# Patient Record
Sex: Female | Born: 1970 | Race: White | Hispanic: No | Smoking: Current every day smoker
Health system: Southern US, Community
[De-identification: ages and names within clinical notes are randomized; demographics above are authoritative.]

## PROBLEM LIST (undated history)

## (undated) DIAGNOSIS — L309 Dermatitis, unspecified: Secondary | ICD-10-CM

## (undated) DIAGNOSIS — R718 Other abnormality of red blood cells: Secondary | ICD-10-CM

## (undated) DIAGNOSIS — M1611 Unilateral primary osteoarthritis, right hip: Secondary | ICD-10-CM

## (undated) DIAGNOSIS — F410 Panic disorder [episodic paroxysmal anxiety] without agoraphobia: Secondary | ICD-10-CM

## (undated) DIAGNOSIS — I10 Essential (primary) hypertension: Secondary | ICD-10-CM

## (undated) DIAGNOSIS — R112 Nausea with vomiting, unspecified: Secondary | ICD-10-CM

## (undated) DIAGNOSIS — F329 Major depressive disorder, single episode, unspecified: Secondary | ICD-10-CM

## (undated) DIAGNOSIS — K219 Gastro-esophageal reflux disease without esophagitis: Secondary | ICD-10-CM

## (undated) DIAGNOSIS — M1990 Unspecified osteoarthritis, unspecified site: Secondary | ICD-10-CM

## (undated) DIAGNOSIS — E538 Deficiency of other specified B group vitamins: Secondary | ICD-10-CM

## (undated) DIAGNOSIS — M51369 Other intervertebral disc degeneration, lumbar region without mention of lumbar back pain or lower extremity pain: Secondary | ICD-10-CM

## (undated) DIAGNOSIS — N809 Endometriosis, unspecified: Secondary | ICD-10-CM

## (undated) DIAGNOSIS — F32A Depression, unspecified: Secondary | ICD-10-CM

## (undated) DIAGNOSIS — K589 Irritable bowel syndrome without diarrhea: Secondary | ICD-10-CM

## (undated) DIAGNOSIS — Z86718 Personal history of other venous thrombosis and embolism: Secondary | ICD-10-CM

## (undated) DIAGNOSIS — G709 Myoneural disorder, unspecified: Secondary | ICD-10-CM

## (undated) DIAGNOSIS — R609 Edema, unspecified: Secondary | ICD-10-CM

## (undated) DIAGNOSIS — Z87442 Personal history of urinary calculi: Secondary | ICD-10-CM

## (undated) DIAGNOSIS — D510 Vitamin B12 deficiency anemia due to intrinsic factor deficiency: Secondary | ICD-10-CM

## (undated) DIAGNOSIS — Z8489 Family history of other specified conditions: Secondary | ICD-10-CM

## (undated) DIAGNOSIS — M5136 Other intervertebral disc degeneration, lumbar region: Secondary | ICD-10-CM

## (undated) DIAGNOSIS — Z9889 Other specified postprocedural states: Secondary | ICD-10-CM

## (undated) HISTORY — DX: Personal history of other venous thrombosis and embolism: Z86.718

## (undated) HISTORY — DX: Major depressive disorder, single episode, unspecified: F32.9

## (undated) HISTORY — PX: LAPAROSCOPY: SHX197

## (undated) HISTORY — DX: Vitamin B12 deficiency anemia due to intrinsic factor deficiency: D51.0

## (undated) HISTORY — DX: Unilateral primary osteoarthritis, right hip: M16.11

## (undated) HISTORY — DX: Dermatitis, unspecified: L30.9

## (undated) HISTORY — DX: Panic disorder (episodic paroxysmal anxiety): F41.0

## (undated) HISTORY — PX: ANKLE SURGERY: SHX546

## (undated) HISTORY — DX: Depression, unspecified: F32.A

## (undated) HISTORY — DX: Edema, unspecified: R60.9

## (undated) HISTORY — PX: OTHER SURGICAL HISTORY: SHX169

## (undated) HISTORY — PX: LACRIMAL DUCT RECONSTRUCTION: SHX1906

## (undated) HISTORY — PX: ABDOMINAL HYSTERECTOMY: SHX81

## (undated) HISTORY — PX: CHOLECYSTECTOMY: SHX55

---
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
## 1993-11-16 HISTORY — PX: OTHER SURGICAL HISTORY: SHX169

## 2004-12-12 ENCOUNTER — Emergency Department: Payer: Self-pay | Admitting: Unknown Physician Specialty

## 2005-07-24 ENCOUNTER — Ambulatory Visit: Payer: Self-pay

## 2005-07-27 ENCOUNTER — Ambulatory Visit: Payer: Self-pay | Admitting: Family Medicine

## 2006-03-18 ENCOUNTER — Ambulatory Visit: Payer: Self-pay | Admitting: Internal Medicine

## 2006-04-16 ENCOUNTER — Ambulatory Visit: Payer: Self-pay | Admitting: Internal Medicine

## 2006-05-16 ENCOUNTER — Ambulatory Visit: Payer: Self-pay | Admitting: Internal Medicine

## 2006-06-10 ENCOUNTER — Inpatient Hospital Stay: Payer: Self-pay | Admitting: Obstetrics and Gynecology

## 2006-06-16 ENCOUNTER — Ambulatory Visit: Payer: Self-pay | Admitting: Internal Medicine

## 2006-07-17 ENCOUNTER — Ambulatory Visit: Payer: Self-pay | Admitting: Internal Medicine

## 2006-10-13 ENCOUNTER — Emergency Department: Payer: Self-pay | Admitting: Emergency Medicine

## 2008-12-16 ENCOUNTER — Emergency Department: Payer: Self-pay | Admitting: Emergency Medicine

## 2009-03-11 DIAGNOSIS — I10 Essential (primary) hypertension: Secondary | ICD-10-CM | POA: Insufficient documentation

## 2009-08-27 ENCOUNTER — Ambulatory Visit: Payer: Self-pay | Admitting: Family Medicine

## 2009-09-10 ENCOUNTER — Encounter: Payer: Self-pay | Admitting: Orthopedic Surgery

## 2009-09-16 ENCOUNTER — Encounter: Payer: Self-pay | Admitting: Orthopedic Surgery

## 2009-10-16 ENCOUNTER — Encounter: Payer: Self-pay | Admitting: Orthopedic Surgery

## 2009-11-16 ENCOUNTER — Encounter: Payer: Self-pay | Admitting: Orthopedic Surgery

## 2009-11-21 ENCOUNTER — Ambulatory Visit: Payer: Self-pay | Admitting: Family Medicine

## 2010-05-06 DIAGNOSIS — N809 Endometriosis, unspecified: Secondary | ICD-10-CM | POA: Insufficient documentation

## 2010-08-18 ENCOUNTER — Emergency Department: Payer: Self-pay | Admitting: Emergency Medicine

## 2010-08-30 ENCOUNTER — Emergency Department: Payer: Self-pay | Admitting: Emergency Medicine

## 2010-09-04 ENCOUNTER — Emergency Department: Payer: Self-pay | Admitting: Emergency Medicine

## 2010-11-16 HISTORY — PX: KNEE SURGERY: SHX244

## 2011-07-10 ENCOUNTER — Emergency Department: Payer: Self-pay | Admitting: Emergency Medicine

## 2012-01-05 IMAGING — CR DG CHEST 2V
1 series · 2 of 2 positions shown · non-contrast
Comparison: none

REASON FOR EXAM: Shortness of Breath
COMMENTS:   May transport without cardiac monitor

[Series 1: view not recorded · 0.17mm/px · 2 of 2 slices shown]
[im 1/2]
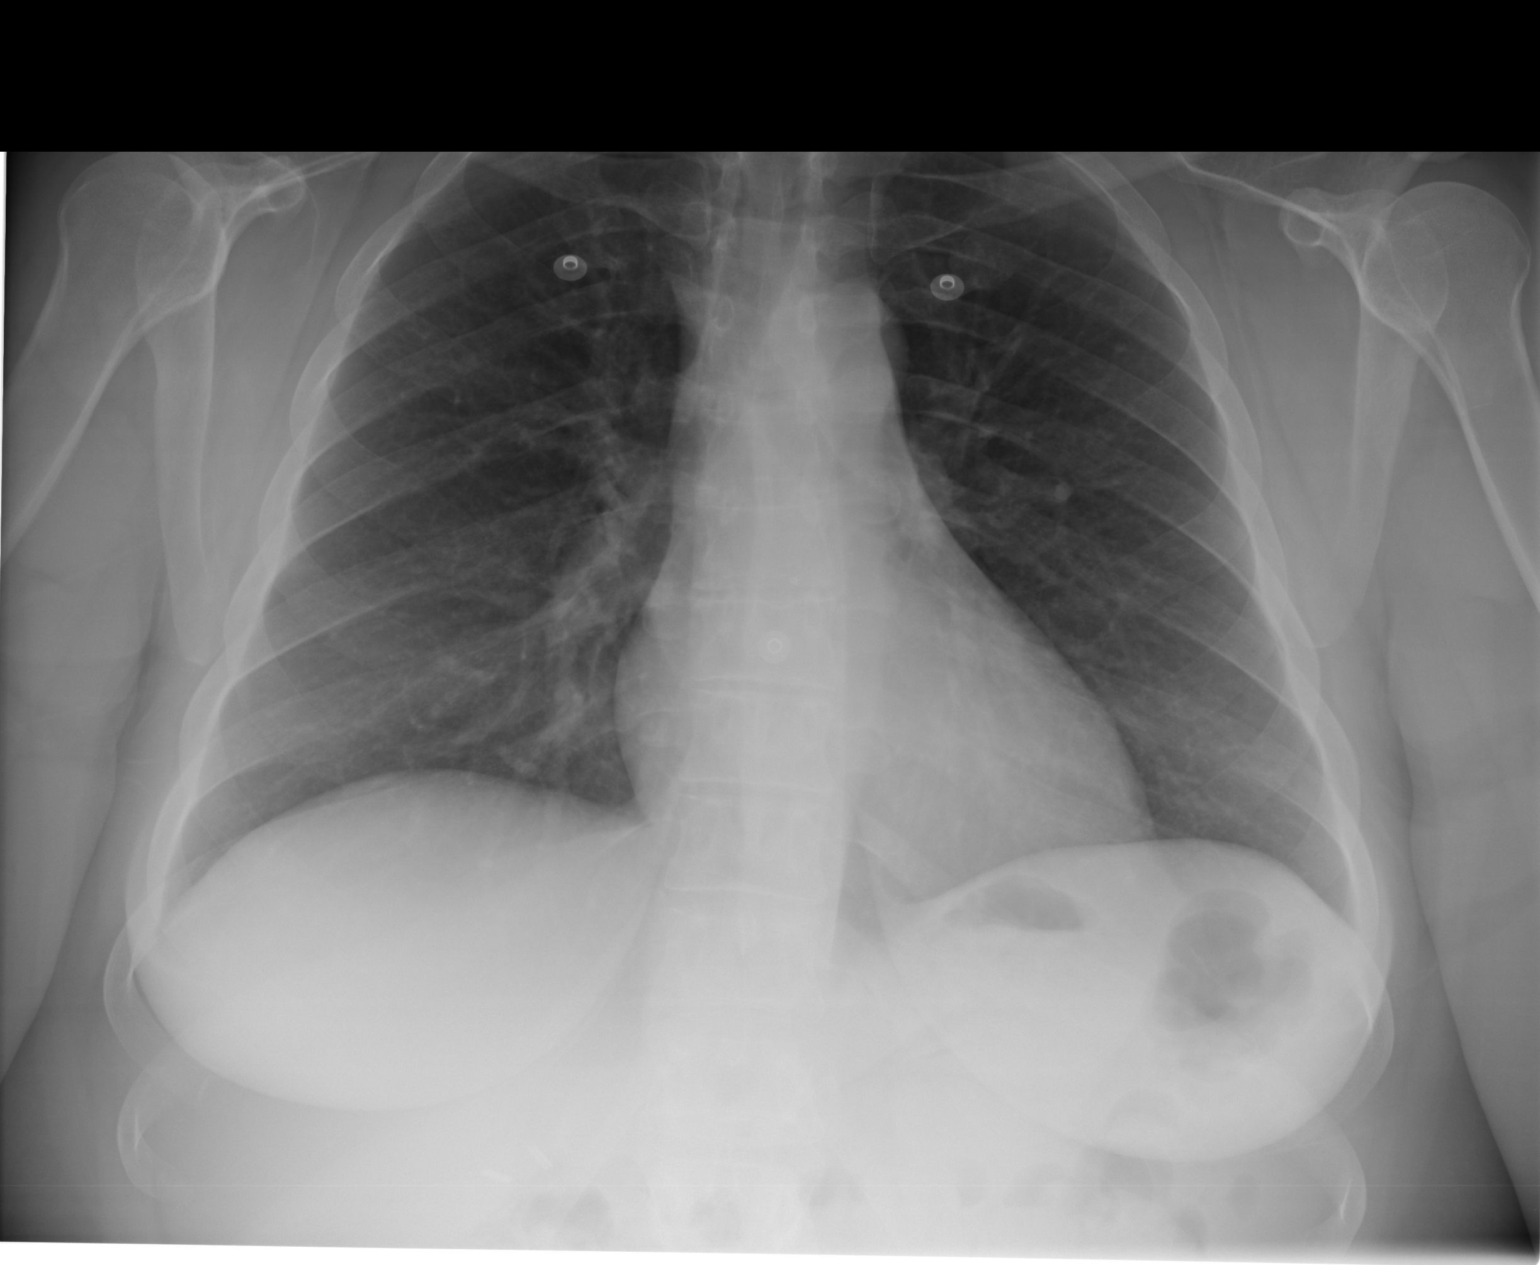
[im 2/2]
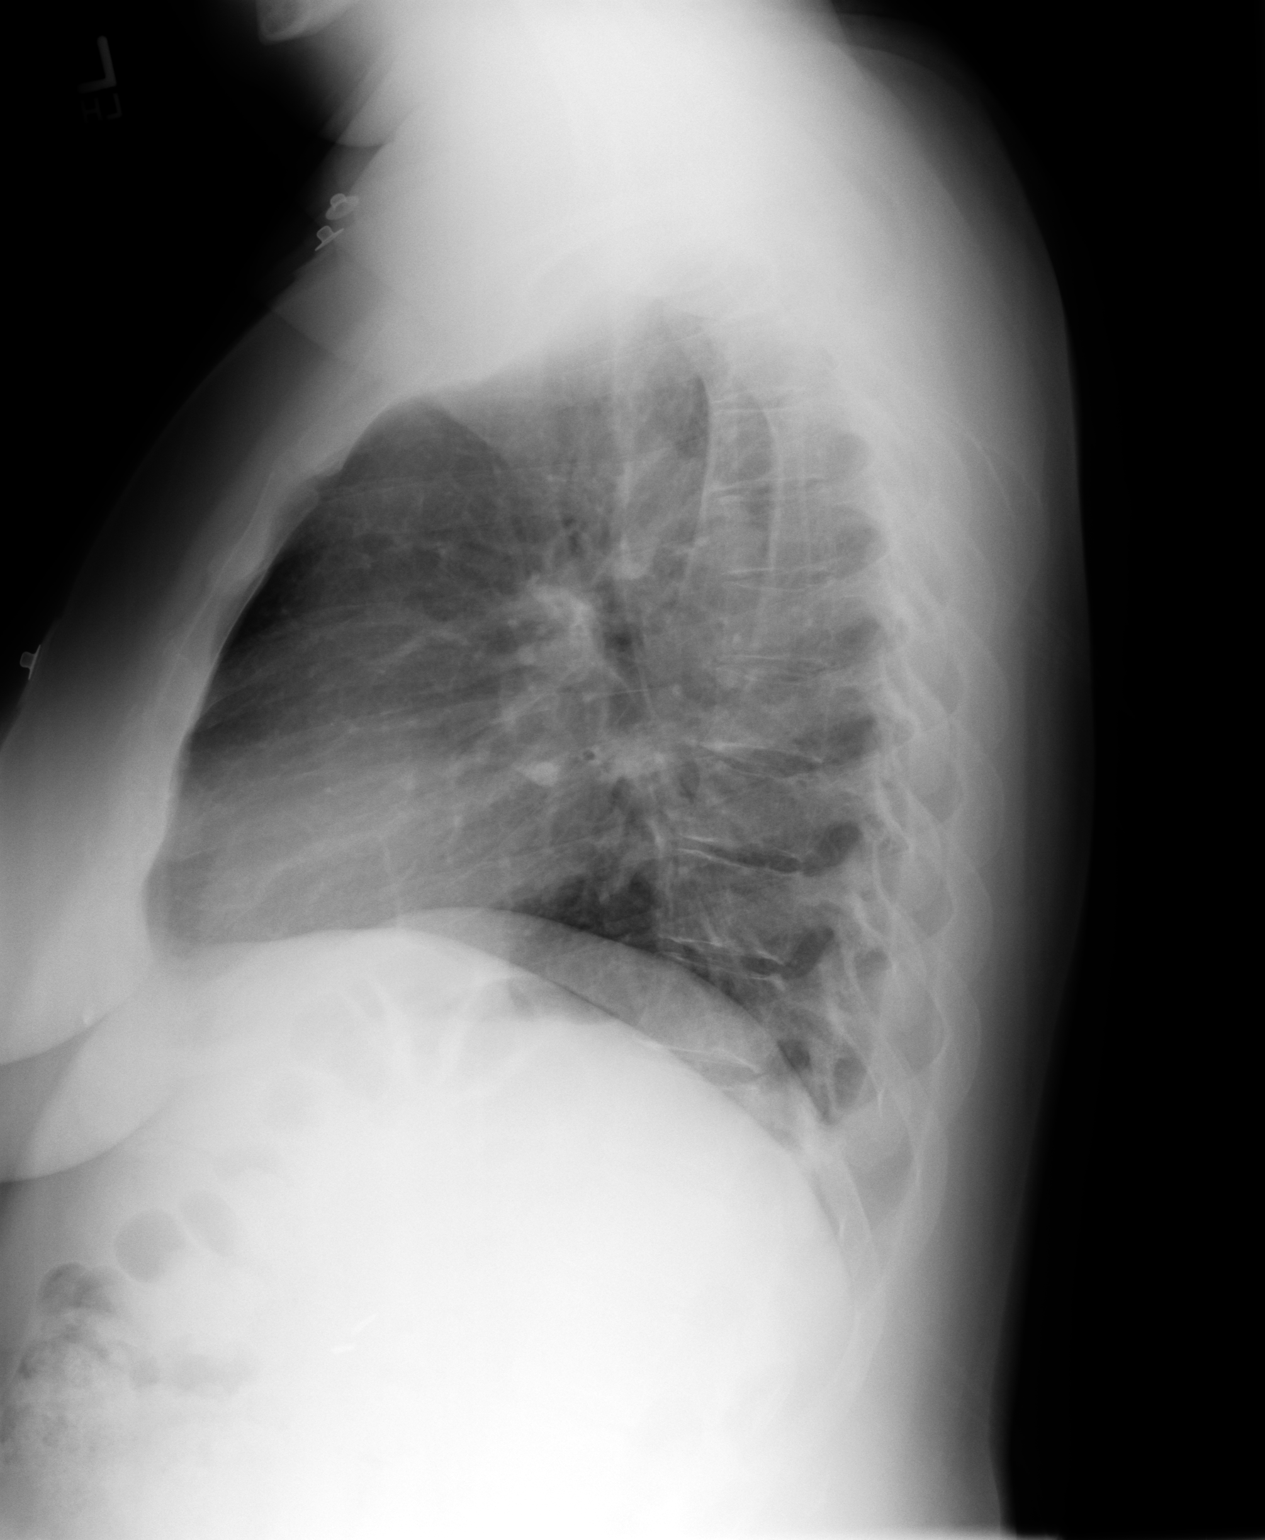

[2 of 2 positions shown; findings below may reference images not displayed]

PROCEDURE:     DXR - DXR CHEST PA (OR AP) AND LATERAL  - July 10, 2011  [DATE]

RESULT:     The lungs are adequately inflated. There is no focal infiltrate.
The cardiac silhouette is normal in size. The pulmonary vascularity is not
engorged. There is no pleural effusion. The mediastinum is normal in width.
IMPRESSION: I do not see evidence of acute cardiopulmonary abnormality.
If the patient's cough persists and remains unexplained, followup plain
films or CT scanning may be of value.

## 2012-02-06 ENCOUNTER — Ambulatory Visit: Payer: Self-pay | Admitting: General Practice

## 2012-03-10 ENCOUNTER — Ambulatory Visit: Payer: Self-pay | Admitting: General Practice

## 2012-03-10 DIAGNOSIS — I10 Essential (primary) hypertension: Secondary | ICD-10-CM

## 2012-03-10 LAB — BASIC METABOLIC PANEL
Calcium, Total: 9.1 mg/dL (ref 8.5–10.1)
Chloride: 103 mmol/L (ref 98–107)
Co2: 29 mmol/L (ref 21–32)
Creatinine: 0.74 mg/dL (ref 0.60–1.30)
EGFR (Non-African Amer.): >60
Glucose: 89 mg/dL (ref 65–99)
Osmolality: 275 (ref 275–301)
Potassium: 3.1 mmol/L — ABNORMAL LOW (ref 3.5–5.1)
Sodium: 139 mmol/L (ref 136–145)

## 2012-03-10 LAB — CBC WITH DIFFERENTIAL/PLATELET
Eosinophil #: 0.1 10*3/uL (ref 0.0–0.7)
Eosinophil %: 1.3 %
HGB: 15.9 g/dL (ref 12.0–16.0)
Lymphocyte %: 23.5 %
MCH: 31 pg (ref 26.0–34.0)
MCHC: 33.7 g/dL (ref 32.0–36.0)
Monocyte %: 4.6 %
Neutrophil %: 69.7 %
RBC: 5.12 10*6/uL (ref 3.80–5.20)
RDW: 13.6 % (ref 11.5–14.5)

## 2012-03-25 ENCOUNTER — Ambulatory Visit: Payer: Self-pay | Admitting: General Practice

## 2012-03-25 LAB — POTASSIUM: Potassium: 3 mmol/L — ABNORMAL LOW (ref 3.5–5.1)

## 2012-05-27 ENCOUNTER — Ambulatory Visit: Payer: Self-pay | Admitting: General Practice

## 2012-08-23 ENCOUNTER — Ambulatory Visit: Payer: Self-pay | Admitting: Hematology and Oncology

## 2012-08-23 LAB — CBC CANCER CENTER
Basophil %: 0.9 %
Eosinophil %: 2.1 %
HCT: 47.4 % — ABNORMAL HIGH (ref 35.0–47.0)
Lymphocyte #: 2.4 x10 3/mm (ref 1.0–3.6)
Lymphocyte %: 19 %
MCH: 30.7 pg (ref 26.0–34.0)
MCHC: 33.8 g/dL (ref 32.0–36.0)
MCV: 91 fL (ref 80–100)
Monocyte #: 0.9 x10 3/mm (ref 0.2–0.9)
Neutrophil %: 71.1 %
Platelet: 358 x10 3/mm (ref 150–440)
RBC: 5.21 10*6/uL — ABNORMAL HIGH (ref 3.80–5.20)
WBC: 12.7 x10 3/mm — ABNORMAL HIGH (ref 3.6–11.0)

## 2012-09-08 LAB — CBC CANCER CENTER
Basophil #: 0.1 x10 3/mm (ref 0.0–0.1)
Eosinophil %: 1.7 %
HCT: 42 % (ref 35.0–47.0)
Lymphocyte #: 2 x10 3/mm (ref 1.0–3.6)
Lymphocyte %: 19.5 %
MCH: 31.1 pg (ref 26.0–34.0)
MCV: 92 fL (ref 80–100)
Monocyte %: 4.5 %
Neutrophil #: 7.5 x10 3/mm — ABNORMAL HIGH (ref 1.4–6.5)
Platelet: 333 x10 3/mm (ref 150–440)
RDW: 14.1 % (ref 11.5–14.5)
WBC: 10.2 x10 3/mm (ref 3.6–11.0)

## 2012-09-08 LAB — COMPREHENSIVE METABOLIC PANEL
Anion Gap: 10 (ref 7–16)
Bilirubin,Total: 0.4 mg/dL (ref 0.2–1.0)
Calcium, Total: 8.7 mg/dL (ref 8.5–10.1)
Chloride: 100 mmol/L (ref 98–107)
Co2: 30 mmol/L (ref 21–32)
Creatinine: 0.75 mg/dL (ref 0.60–1.30)
EGFR (African American): >60
EGFR (Non-African Amer.): >60
Glucose: 87 mg/dL (ref 65–99)
Osmolality: 276 (ref 275–301)
Potassium: 2.9 mmol/L — ABNORMAL LOW (ref 3.5–5.1)
Sodium: 140 mmol/L (ref 136–145)

## 2012-09-09 ENCOUNTER — Ambulatory Visit: Payer: Self-pay | Admitting: Hematology and Oncology

## 2012-09-09 LAB — POTASSIUM: Potassium: 3.4 mmol/L — ABNORMAL LOW (ref 3.5–5.1)

## 2012-09-16 ENCOUNTER — Ambulatory Visit: Payer: Self-pay | Admitting: Hematology and Oncology

## 2012-09-22 ENCOUNTER — Other Ambulatory Visit: Payer: Self-pay | Admitting: Nephrology

## 2012-09-22 LAB — CBC CANCER CENTER
Basophil #: 0.1 x10 3/mm (ref 0.0–0.1)
Eosinophil #: 0.1 x10 3/mm (ref 0.0–0.7)
Lymphocyte #: 2.8 x10 3/mm (ref 1.0–3.6)
Lymphocyte %: 20.8 %
MCHC: 32.6 g/dL (ref 32.0–36.0)
MCV: 93 fL (ref 80–100)
Monocyte #: 0.9 x10 3/mm (ref 0.2–0.9)
Monocyte %: 6.5 %
Neutrophil #: 9.6 x10 3/mm — ABNORMAL HIGH (ref 1.4–6.5)
Platelet: 398 x10 3/mm (ref 150–440)
RDW: 14.2 % (ref 11.5–14.5)
WBC: 13.4 x10 3/mm — ABNORMAL HIGH (ref 3.6–11.0)

## 2012-09-22 LAB — BASIC METABOLIC PANEL
BUN: 11 mg/dL (ref 7–18)
Creatinine: 0.72 mg/dL (ref 0.60–1.30)
EGFR (African American): >60
EGFR (Non-African Amer.): >60
Glucose: 80 mg/dL (ref 65–99)
Osmolality: 276 (ref 275–301)
Sodium: 139 mmol/L (ref 136–145)

## 2012-09-22 LAB — COMPREHENSIVE METABOLIC PANEL
Alkaline Phosphatase: 106 U/L (ref 50–136)
Anion Gap: 12 (ref 7–16)
Calcium, Total: 9 mg/dL (ref 8.5–10.1)
Chloride: 103 mmol/L (ref 98–107)
Co2: 23 mmol/L (ref 21–32)
Creatinine: 0.86 mg/dL (ref 0.60–1.30)
EGFR (Non-African Amer.): >60
Osmolality: 274 (ref 275–301)
Potassium: 3.9 mmol/L (ref 3.5–5.1)
SGOT(AST): 12 U/L — ABNORMAL LOW (ref 15–37)
SGPT (ALT): 18 U/L (ref 12–78)
Sodium: 138 mmol/L (ref 136–145)

## 2012-09-22 LAB — MAGNESIUM
Magnesium: 1.8 mg/dL
Magnesium: 2 mg/dL

## 2012-10-16 ENCOUNTER — Ambulatory Visit: Payer: Self-pay | Admitting: Hematology and Oncology

## 2014-05-24 ENCOUNTER — Emergency Department: Payer: Self-pay | Admitting: Emergency Medicine

## 2014-06-19 DIAGNOSIS — M7061 Trochanteric bursitis, right hip: Secondary | ICD-10-CM | POA: Insufficient documentation

## 2014-06-19 DIAGNOSIS — M533 Sacrococcygeal disorders, not elsewhere classified: Secondary | ICD-10-CM | POA: Insufficient documentation

## 2014-06-19 DIAGNOSIS — M25859 Other specified joint disorders, unspecified hip: Secondary | ICD-10-CM | POA: Insufficient documentation

## 2014-06-19 DIAGNOSIS — M76899 Other specified enthesopathies of unspecified lower limb, excluding foot: Secondary | ICD-10-CM | POA: Insufficient documentation

## 2014-07-03 DIAGNOSIS — M461 Sacroiliitis, not elsewhere classified: Secondary | ICD-10-CM | POA: Insufficient documentation

## 2014-09-05 DIAGNOSIS — R600 Localized edema: Secondary | ICD-10-CM | POA: Insufficient documentation

## 2014-09-05 DIAGNOSIS — I10 Essential (primary) hypertension: Secondary | ICD-10-CM | POA: Insufficient documentation

## 2014-09-12 DIAGNOSIS — M5116 Intervertebral disc disorders with radiculopathy, lumbar region: Secondary | ICD-10-CM | POA: Insufficient documentation

## 2014-09-12 DIAGNOSIS — M5416 Radiculopathy, lumbar region: Secondary | ICD-10-CM | POA: Insufficient documentation

## 2014-09-19 ENCOUNTER — Ambulatory Visit: Payer: Self-pay | Admitting: Physical Medicine and Rehabilitation

## 2014-09-27 DIAGNOSIS — M94262 Chondromalacia, left knee: Secondary | ICD-10-CM | POA: Insufficient documentation

## 2014-10-04 DIAGNOSIS — M5136 Other intervertebral disc degeneration, lumbar region: Secondary | ICD-10-CM | POA: Insufficient documentation

## 2014-10-08 ENCOUNTER — Emergency Department: Payer: Self-pay | Admitting: Emergency Medicine

## 2014-10-23 DIAGNOSIS — M170 Bilateral primary osteoarthritis of knee: Secondary | ICD-10-CM | POA: Insufficient documentation

## 2015-03-10 NOTE — Op Note (Signed)
PATIENT NAME:  Michelle Simpson, Michelle Simpson MR#:  [id number] DATE OF BIRTH:  1970/12/09  DATE OF PROCEDURE:  03/25/2012  PREOPERATIVE DIAGNOSIS: Internal derangement of the left knee.   POSTOPERATIVE DIAGNOSES:    1. Tear of the posterior horn of the lateral meniscus, left knee.  2. 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   PROCEDURE PERFORMED: Left knee arthroscopy, partial lateral meniscectomy, and patellofemoral chondroplasty.   SURGEON:  Dr. Skip Estimable  ANESTHESIA: Spinal.   ESTIMATED BLOOD LOSS: Minimal.   TOURNIQUET TIME: Not used.   DRAINS: None.   INDICATIONS FOR SURGERY: The patient is a 44 year old female who has been seen for complaints of progressive left knee pain. MRI demonstrated findings consistent with meniscal pathology. After discussion of the risks and benefits of surgical intervention, the patient expressed her understanding of the risks and benefits and agreed with plans for surgical intervention.   PROCEDURE IN DETAIL: The patient was brought into the operating room and, after adequate spinal anesthesia was achieved, a tourniquet was placed on the patient's left thigh and the leg was placed in a leg holder. All bony prominences were well padded. The left knee and leg were cleaned and prepped with alcohol and DuraPrep and draped in the usual sterile fashion. A "time-out" was performed as per usual protocol. The anticipated portal sites were injected with 0.25% Marcaine with epinephrine. An anterolateral portal was created and cannula was inserted. The scope was inserted and the knee was distended with fluid using the Depew Mitek pump. The scope was advanced down the medial gutter into the medial compartment of the knee. Under visualization with the scope, an anteromedial portal was created and hooked probe was inserted. Inspection of the medial compartment showed the articular surface to be in excellent condition. The medial meniscus was visualized and probed  and felt to be stable. The scope was then advanced into the intercondylar region. The anterior cruciate ligament was visualized and probed and felt to be stable. The scope was removed from the anterolateral portal and reinserted via the anteromedial portal so as to better visualize the lateral compartment. The articular surface of the lateral compartment was in good condition. There was a degenerative tear of the posterior horn of the lateral meniscus, which was debrided using meniscal punches and a 4.5-mm shaver. Additional contouring and debridement was performed using the 50-degrees ArthroCare wand. The remaining rim of meniscus was visualized and probed and felt to be stable. Finally, the scope was positioned so as to visualize the patellofemoral articulation. Reasonably good patellar tracking was noted. However, there was noted to be an area of grade 3 chondromalacia involving the articular surface of the patella as well as the intercondylar groove. Debridement and contouring was performed using the 50-degree ArthroCare wand. The scope was returned to the lateral compartment. It should  be noted that there was some softening and small fissure to the lateral tibial plateau. This was debrided and contoured using the ArthroCare wand. The knee was irrigated with copious amounts of fluid and then suctioned dry. The anterolateral portal was reapproximated using #3-0 nylon. A combination of 0.25% Marcaine with epinephrine and 4 mg morphine was injected via the scope. The scope was removed and the anteromedial portal was reapproximated using #3-0 nylon. Sterile dressing was applied followed by application of an ice wrap.   The patient tolerated the procedure well. She was transported to the recovery room in stable condition.    ____________________________ Laurice Record. Holley Bouche., MD [email or web address] D: 03/25/2012 18:20:50  ET T: 03/26/2012 15:17:36 ET JOB#: [id number]  cc: Jeneen Rinks P. Holley Bouche., MD, <Dictator> JAMES P Holley Bouche MD ELECTRONICALLY SIGNED 04/02/2012 21:20

## 2015-05-10 ENCOUNTER — Encounter: Payer: Self-pay | Admitting: Emergency Medicine

## 2015-05-10 ENCOUNTER — Emergency Department
Admission: EM | Admit: 2015-05-10 | Discharge: 2015-05-10 | Disposition: A | Discharge status: Home / Self Care | Payer: Self-pay | Attending: Emergency Medicine | Admitting: Emergency Medicine

## 2015-05-10 DIAGNOSIS — M549 Dorsalgia, unspecified: Secondary | ICD-10-CM | POA: Insufficient documentation

## 2015-05-10 DIAGNOSIS — Z79899 Other long term (current) drug therapy: Secondary | ICD-10-CM | POA: Insufficient documentation

## 2015-05-10 DIAGNOSIS — I10 Essential (primary) hypertension: Secondary | ICD-10-CM | POA: Insufficient documentation

## 2015-05-10 DIAGNOSIS — Z91040 Latex allergy status: Secondary | ICD-10-CM | POA: Insufficient documentation

## 2015-05-10 DIAGNOSIS — G8929 Other chronic pain: Secondary | ICD-10-CM | POA: Insufficient documentation

## 2015-05-10 HISTORY — DX: Endometriosis, unspecified: N80.9

## 2015-05-10 HISTORY — DX: Essential (primary) hypertension: I10

## 2015-05-10 HISTORY — DX: Deficiency of other specified B group vitamins: E53.8

## 2015-05-10 HISTORY — DX: Irritable bowel syndrome, unspecified: K58.9

## 2015-05-10 HISTORY — DX: Other intervertebral disc degeneration, lumbar region: M51.36

## 2015-05-10 HISTORY — DX: Other intervertebral disc degeneration, lumbar region without mention of lumbar back pain or lower extremity pain: M51.369

## 2015-05-10 HISTORY — DX: Other abnormality of red blood cells: R71.8

## 2015-05-10 MED ORDER — DIAZEPAM 2 MG PO TABS: 2.0000 mg | ORAL_TABLET | Freq: Three times a day (TID) | ORAL | Status: AC | PRN

## 2015-05-10 MED ORDER — PREDNISONE 10 MG (21) PO TBPK: ORAL_TABLET | ORAL | Status: DC

## 2015-05-10 MED ORDER — DIAZEPAM 2 MG PO TABS
2.0000 mg | ORAL_TABLET | Freq: Once | ORAL | Status: AC
  Administered 2015-05-10: 2 mg via ORAL

## 2015-05-10 MED ORDER — DEXAMETHASONE SODIUM PHOSPHATE 10 MG/ML IJ SOLN
10.0000 mg | Freq: Once | INTRAMUSCULAR | Status: AC
  Administered 2015-05-10: 10 mg via INTRAMUSCULAR

## 2015-05-10 MED ORDER — DIAZEPAM 2 MG PO TABS
ORAL_TABLET | ORAL | Status: AC
  Administered 2015-05-10: 2 mg via ORAL
  Filled 2015-05-10: qty 1

## 2015-05-10 MED ORDER — OXYCODONE HCL 5 MG PO TABS
ORAL_TABLET | ORAL | Status: AC
  Administered 2015-05-10: 5 mg via ORAL
  Filled 2015-05-10: qty 1

## 2015-05-10 MED ORDER — DEXAMETHASONE SODIUM PHOSPHATE 10 MG/ML IJ SOLN
INTRAMUSCULAR | Status: AC
  Administered 2015-05-10: 10 mg via INTRAMUSCULAR
  Filled 2015-05-10: qty 1

## 2015-05-10 MED ORDER — OXYCODONE HCL 5 MG PO TABS
5.0000 mg | ORAL_TABLET | Freq: Once | ORAL | Status: AC
  Administered 2015-05-10: 5 mg via ORAL

## 2015-05-10 MED ORDER — HYDROCODONE-ACETAMINOPHEN 5-325 MG PO TABS: 1.0000 | ORAL_TABLET | ORAL | Status: DC | PRN

## 2015-05-10 NOTE — ED Provider Notes (Signed)
[facility] Emergency Department Provider Note ____________________________________________  Time seen: Approximately 10:05 PM  I have reviewed the triage vital signs and the nursing notes.   HISTORY  Chief Complaint Back Pain    HPI Michelle Simpson is a 44 y.o. female who presents to the emergency department for a flare of her chronic back pain that was triggered by writing for a long period of time in the vehicle. She is working with a spine specialist who is doing facet injections and epidural injections. Her last injection was a few months ago. She states that she is unable to sit, stand, or lie in any position for an extended period of time without pain. She reports that this flare is similar to previous in location and character. She denies new injury.   Past Medical History  Diagnosis Date  . Degenerative disc disease, lumbar   . Hypertension   . B12 deficiency   . High mean corpuscular hemoglobin concentration (MCHC)   . Endometriosis   . IBS (irritable bowel syndrome)     There are no active problems to display for this patient.   Past Surgical History  Procedure Laterality Date  . Ankle surgery Right   . Abdominal hysterectomy      Current Outpatient Rx  Name  Route  Sig  Dispense  Refill  . diazepam (VALIUM) 2 MG tablet   Oral   Take 1 tablet (2 mg total) by mouth every 8 (eight) hours as needed for anxiety.   12 tablet   0   . HYDROcodone-acetaminophen (NORCO/VICODIN) 5-325 MG per tablet   Oral   Take 1 tablet by mouth every 4 (four) hours as needed for moderate pain.   9 tablet   0   . predniSONE (STERAPRED UNI-PAK 21 TAB) 10 MG (21) TBPK tablet      Take 6 tablets on day 1 Take 5 tablets on day 2 Take 4 tablets on day 3 Take 3 tablets on day 4 Take 2 tablets on day 5 Take 1 tablet on day 6   21 tablet   0     Allergies Amlodipine; Latex; Metoprolol; Nsaids; and Paxil cr  No family history on file.  Social  History History  Substance Use Topics  . Smoking status: Current Every Day Smoker    Types: Cigarettes  . Smokeless tobacco: Not on file  . Alcohol Use: No    Review of Systems Constitutional: No recent illness. Eyes: No visual changes. ENT: No sore throat. Cardiovascular: Denies chest pain or palpitations. Respiratory: Denies shortness of breath. Gastrointestinal: No abdominal pain.  Genitourinary: Negative for dysuria. Musculoskeletal: Pain in lower back Skin: Negative for rash. Neurological: Negative for headaches, focal weakness or numbness. 10-point ROS otherwise negative.  ____________________________________________   PHYSICAL EXAM:  VITAL SIGNS: ED Triage Vitals  Enc Vitals Group     BP 05/10/15 1942 189/97 mmHg     Pulse Rate 05/10/15 1938 70     Resp 05/10/15 1938 18     Temp 05/10/15 1938 98.4 F (36.9 C)     Temp Source 05/10/15 1938 Oral     SpO2 05/10/15 1938 98 %     Weight 05/10/15 1938 200 lb (90.719 kg)     Height 05/10/15 1938 5\' 7"  (1.702 m)     Head Cir --      Peak Flow --      Pain Score 05/10/15 1939 9     Pain Loc --  Pain Edu? --      Excl. in [place]? --     Constitutional: Alert and oriented. Well appearing and in no acute distress. Appears to be in pain. Eyes: Conjunctivae are normal. EOMI. Head: Atraumatic. Nose: No congestion/rhinnorhea. Neck: No stridor.  Respiratory: Normal respiratory effort.   Musculoskeletal: Full range of motion possible. Full extension/standing completely upright is most painful. Ambulatory with antalgic gait. Neurologic:  Normal speech and language. No gross focal neurologic deficits are appreciated. Speech is normal. No gait instability. Skin:  Skin is warm, dry and intact. Atraumatic. Psychiatric: Mood and affect are normal. Speech and behavior are normal.  ____________________________________________   LABS (all labs ordered are listed, but only abnormal results are displayed)  Labs Reviewed - No  data to display ____________________________________________  RADIOLOGY  Not indicated ____________________________________________   PROCEDURES  Procedure(s) performed: None   ____________________________________________   INITIAL IMPRESSION / ASSESSMENT AND PLAN / ED COURSE  Pertinent labs & imaging results that were available during my care of the patient were reviewed by me and considered in my medical decision making (see chart for details).  Patient is to call her specialist on Monday. She was advised to return to the emergency department for symptoms that change or worsen including incontinence of bowel or bladder. She was advised to take medications as prescribed. She was also advised that the emergency department does not typically manage chronic pain. ____________________________________________   FINAL CLINICAL IMPRESSION(S) / ED DIAGNOSES  Final diagnoses:  Chronic back pain greater than 3 months duration       Victorino Dike, FNP 05/10/15 2212  Ahmed Prima, MD 05/11/15 [id number]

## 2015-05-10 NOTE — Discharge Instructions (Signed)
Follow up with your back specialist for symptoms that are not improving over the next 48 hours. Return to the ER for symptoms that change or worsen if unable to schedule an appointment with the specialist.

## 2015-05-10 NOTE — ED Notes (Signed)
Pt ambulatory to ED c/o lower back pain, hx of degenerative disc disease.

## 2016-03-20 ENCOUNTER — Encounter: Payer: Self-pay | Admitting: Family Medicine

## 2016-03-20 ENCOUNTER — Ambulatory Visit (INDEPENDENT_AMBULATORY_CARE_PROVIDER_SITE_OTHER): Payer: BLUE CROSS/BLUE SHIELD | Admitting: Family Medicine

## 2016-03-20 VITALS — BP 138/100 | HR 70 | Temp 98.1°F | Resp 16 | Ht 67.5 in | Wt 196.6 lb

## 2016-03-20 DIAGNOSIS — M7989 Other specified soft tissue disorders: Secondary | ICD-10-CM | POA: Insufficient documentation

## 2016-03-20 DIAGNOSIS — M1611 Unilateral primary osteoarthritis, right hip: Secondary | ICD-10-CM | POA: Insufficient documentation

## 2016-03-20 DIAGNOSIS — J309 Allergic rhinitis, unspecified: Secondary | ICD-10-CM | POA: Insufficient documentation

## 2016-03-20 DIAGNOSIS — E538 Deficiency of other specified B group vitamins: Secondary | ICD-10-CM

## 2016-03-20 DIAGNOSIS — R002 Palpitations: Secondary | ICD-10-CM | POA: Diagnosis not present

## 2016-03-20 DIAGNOSIS — E785 Hyperlipidemia, unspecified: Secondary | ICD-10-CM

## 2016-03-20 DIAGNOSIS — D6851 Activated protein C resistance: Secondary | ICD-10-CM | POA: Insufficient documentation

## 2016-03-20 DIAGNOSIS — R102 Pelvic and perineal pain: Secondary | ICD-10-CM

## 2016-03-20 DIAGNOSIS — M169 Osteoarthritis of hip, unspecified: Secondary | ICD-10-CM | POA: Insufficient documentation

## 2016-03-20 DIAGNOSIS — G8929 Other chronic pain: Secondary | ICD-10-CM | POA: Insufficient documentation

## 2016-03-20 DIAGNOSIS — Z86718 Personal history of other venous thrombosis and embolism: Secondary | ICD-10-CM | POA: Insufficient documentation

## 2016-03-20 DIAGNOSIS — I10 Essential (primary) hypertension: Secondary | ICD-10-CM

## 2016-03-20 MED ORDER — FUROSEMIDE 40 MG PO TABS: 40.0000 mg | ORAL_TABLET | Freq: Every day | ORAL | Status: DC

## 2016-03-20 MED ORDER — BISOPROLOL FUMARATE 5 MG PO TABS: 5.0000 mg | ORAL_TABLET | Freq: Every day | ORAL | Status: DC

## 2016-03-20 NOTE — Patient Instructions (Signed)
We will call you with the lab results. 

## 2016-03-20 NOTE — Progress Notes (Signed)
Subjective:     Patient ID: Michelle Simpson, female   DOB: 22-Nov-1970, 45 y.o.   MRN: [id number]  HPI  Chief Complaint  Patient presents with  . Hypertension    Patient comes into office today with concerns of elevated blood pressure for the past 2 weeks. Patient reports that she has been checking her bloodpressure daily and her systolic readings have been ranging from 123XX123, diastolic reading ranging from 100-140. Patient reports symptoms of chest pounding, shortness of breath, visual changes decribes as seeing spots and frequent headaches. Patient states that she is currently taking Bisprolol 2.5mg  and Lasix PRN.   States she was due to see her cardiologist to f/u hypertension this Spring but he is no longer in her insurance list. Last seen in our office 10/2014. Reports she is working extra hours at work, drinking 72 oz of green tea daily, and coping with her partner's loss of job. States she has resumed using prn Lasix due to her bp elevations.   Review of Systems  Constitutional: Positive for fatigue.  Hematological:       Hx of B12 deficiency, states she takes two sublingual supplements weekly. Hx of polycythemia felt due to cigarette smoking.       Objective:   Physical Exam  Constitutional: She appears well-developed and well-nourished. No distress.  Cardiovascular: Normal rate and regular rhythm.   Pulmonary/Chest: Breath sounds normal.  Musculoskeletal: She exhibits no edema (of lower extremities).  .     Assessment:    1. Benign essential HTN - Comprehensive metabolic panel - bisoprolol (ZEBETA) 5 MG tablet; Take 1 tablet (5 mg total) by mouth daily.  Dispense: 30 tablet; Refill: 5  2. Palpitations - CBC with Differential/Platelet  3. Vitamin B12 deficiency - B12  4. Leg swelling - furosemide (LASIX) 40 MG tablet; Take 1 tablet (40 mg total) by mouth daily. As needed for daily for leg swelling  Dispense: 30 tablet; Refill: 2  5. Hyperlipidemia - Lipid panel    Plan:   Further f/u pending labs and in two weeks.

## 2016-03-21 LAB — LIPID PANEL
Chol/HDL Ratio: 4.2 ratio units (ref 0.0–4.4)
Cholesterol, Total: 259 mg/dL — ABNORMAL HIGH (ref 100–199)
HDL: 62 mg/dL (ref >39)
LDL Calculated: 174 mg/dL — ABNORMAL HIGH (ref 0–99)
Triglycerides: 116 mg/dL (ref 0–149)
VLDL CHOLESTEROL CAL: 23 mg/dL (ref 5–40)

## 2016-03-21 LAB — COMPREHENSIVE METABOLIC PANEL
ALT: 11 IU/L (ref 0–32)
AST: 15 IU/L (ref 0–40)
Albumin/Globulin Ratio: 1.8 (ref 1.2–2.2)
Albumin: 4.8 g/dL (ref 3.5–5.5)
Alkaline Phosphatase: 62 IU/L (ref 39–117)
BUN/Creatinine Ratio: 14 (ref 9–23)
BUN: 10 mg/dL (ref 6–24)
Bilirubin Total: 0.3 mg/dL (ref 0.0–1.2)
CALCIUM: 9.8 mg/dL (ref 8.7–10.2)
CHLORIDE: 97 mmol/L (ref 96–106)
CO2: 20 mmol/L (ref 18–29)
CREATININE: 0.72 mg/dL (ref 0.57–1.00)
GFR, EST AFRICAN AMERICAN: 117 mL/min/{1.73_m2} (ref >59)
GFR, EST NON AFRICAN AMERICAN: 101 mL/min/{1.73_m2} (ref >59)
GLUCOSE: 111 mg/dL — AB (ref 65–99)
Globulin, Total: 2.6 g/dL (ref 1.5–4.5)
Potassium: 4.5 mmol/L (ref 3.5–5.2)
Sodium: 140 mmol/L (ref 134–144)
TOTAL PROTEIN: 7.4 g/dL (ref 6.0–8.5)

## 2016-03-21 LAB — VITAMIN B12: VITAMIN B 12: 1388 pg/mL — AB (ref 211–946)

## 2016-03-23 ENCOUNTER — Telehealth: Payer: Self-pay

## 2016-03-23 NOTE — Telephone Encounter (Signed)
-----   Message from Carmon Ginsberg, [place] sent at 03/23/2016  1:55 PM EDT ----- Regarding: labs Please check with [facility] and see why her CBC result is not up. I have reviewed her other labs with her.

## 2016-03-23 NOTE — Telephone Encounter (Signed)
Spoke with [facility] representative who states that there was no order for CBC given (its noted in chart that CBC was ordered) Gave verbal authorization to fun test if there is enough sample left. Pending on fax report for confirmation. KW

## 2016-03-28 LAB — CBC WITH DIFFERENTIAL/PLATELET
BASOS: 0 %
Basophils Absolute: 0 10*3/uL (ref 0.0–0.2)
EOS (ABSOLUTE): 0.2 10*3/uL (ref 0.0–0.4)
EOS: 3 %
HEMATOCRIT: 48.3 % — AB (ref 34.0–46.6)
Hemoglobin: 16.2 g/dL — ABNORMAL HIGH (ref 11.1–15.9)
IMMATURE GRANS (ABS): 0 10*3/uL (ref 0.0–0.1)
IMMATURE GRANULOCYTES: 0 %
LYMPHS: 27 %
Lymphocytes Absolute: 2 10*3/uL (ref 0.7–3.1)
MCH: 29.7 pg (ref 26.6–33.0)
MCHC: 33.5 g/dL (ref 31.5–35.7)
MCV: 89 fL (ref 79–97)
MONOS ABS: 0.4 10*3/uL (ref 0.1–0.9)
Monocytes: 5 %
NEUTROS ABS: 4.9 10*3/uL (ref 1.4–7.0)
NEUTROS PCT: 65 %
Platelets: 306 10*3/uL (ref 150–379)
RBC: 5.46 x10E6/uL — ABNORMAL HIGH (ref 3.77–5.28)
RDW: 14.1 % (ref 12.3–15.4)
WBC: 7.5 10*3/uL (ref 3.4–10.8)

## 2016-03-30 ENCOUNTER — Other Ambulatory Visit: Payer: Self-pay | Admitting: Family Medicine

## 2016-03-30 ENCOUNTER — Encounter: Payer: Self-pay | Admitting: Family Medicine

## 2016-03-30 ENCOUNTER — Telehealth: Payer: Self-pay

## 2016-03-30 DIAGNOSIS — D45 Polycythemia vera: Secondary | ICD-10-CM

## 2016-03-30 DIAGNOSIS — D751 Secondary polycythemia: Secondary | ICD-10-CM

## 2016-03-30 NOTE — Telephone Encounter (Signed)
Referral in progress. 

## 2016-03-30 NOTE — Telephone Encounter (Signed)
-----   Message from Carmon Ginsberg, [place] sent at 03/30/2016  7:42 AM EDT ----- No anemia but your Hematocrit is up to 48.5% c/w your prior diagnosis of polycythemia. Do you wish to see hematology for phlebotomy?

## 2016-03-30 NOTE — Telephone Encounter (Signed)
Patient has been advised, she would like to proceed with you setting referral up to hematologist. Michelle Simpson

## 2016-04-03 ENCOUNTER — Ambulatory Visit (INDEPENDENT_AMBULATORY_CARE_PROVIDER_SITE_OTHER): Payer: BLUE CROSS/BLUE SHIELD | Admitting: Family Medicine

## 2016-04-03 ENCOUNTER — Encounter: Payer: Self-pay | Admitting: Family Medicine

## 2016-04-03 VITALS — BP 120/74 | HR 66 | Temp 98.3°F | Resp 16 | Wt 197.4 lb

## 2016-04-03 DIAGNOSIS — M5136 Other intervertebral disc degeneration, lumbar region: Secondary | ICD-10-CM

## 2016-04-03 DIAGNOSIS — I10 Essential (primary) hypertension: Secondary | ICD-10-CM | POA: Diagnosis not present

## 2016-04-03 DIAGNOSIS — D45 Polycythemia vera: Secondary | ICD-10-CM

## 2016-04-03 DIAGNOSIS — M170 Bilateral primary osteoarthritis of knee: Secondary | ICD-10-CM

## 2016-04-03 NOTE — Progress Notes (Signed)
Subjective:     Patient ID: Michelle Simpson, female   DOB: 1970/12/17, 45 y.o.   MRN: [id number]  HPI  Chief Complaint  Patient presents with  . Hypertension    Patient comes in office today fo 2 week follow up, last office visit was 03/20/16 CMP was ordered and we increased patients Zebta to 5mg  qd. Patient reports good compliance and tolerance on medication.   . Palpitations    Follow up from 03/20/16, CBC with Diff was orderd, patient reports that she has had a few palpitations since last visit but not nearly as much as before.   States she is due to see the hematologist next week for phlebotomy. Reports that many of her other symptoms were contributed to by her elevated H & H. Wishes referral to pain clinic for her DDD and knee arthritis. States she has had prior injections and placed on numerous medications including Lyrica, gabapentin, and NSAID's. She gets therapeutic massage every 2-3 weeks which gives her temporary relief of back pain. Reports she decreased her green tea consumption by half and that also helped reduce her palpitations. Due to elevated B12 she will back down to every other day supplements.   Review of Systems     Objective:   Physical Exam  Constitutional: She appears well-developed and well-nourished. No distress.  Cardiovascular: Normal rate and regular rhythm.   Pulmonary/Chest: Breath sounds normal.  Musculoskeletal: She exhibits no edema (of lower extremities).       Assessment:    1. Benign essential HTN: stay on current dose of bisoprolol 5 mg.  2. DDD (degenerative disc disease), lumbar - Ambulatory referral to Pain Clinic  3. Primary osteoarthritis of both knees - Ambulatory referral to Pain Clinic  4. Polycythemia [facility]): pending hematology consult     Plan:    Nurse bp check in one-two weeks after phlebotomized.

## 2016-04-03 NOTE — Patient Instructions (Signed)
We will call you with the information about the referral.

## 2016-04-08 ENCOUNTER — Encounter: Payer: Self-pay | Admitting: Internal Medicine

## 2016-04-08 ENCOUNTER — Inpatient Hospital Stay: Payer: BLUE CROSS/BLUE SHIELD | Attending: Internal Medicine | Admitting: Internal Medicine

## 2016-04-08 VITALS — BP 180/123 | HR 61 | Temp 98.4°F | Resp 20 | Wt 199.7 lb

## 2016-04-08 DIAGNOSIS — K589 Irritable bowel syndrome without diarrhea: Secondary | ICD-10-CM | POA: Diagnosis not present

## 2016-04-08 DIAGNOSIS — Z86718 Personal history of other venous thrombosis and embolism: Secondary | ICD-10-CM | POA: Diagnosis not present

## 2016-04-08 DIAGNOSIS — I10 Essential (primary) hypertension: Secondary | ICD-10-CM

## 2016-04-08 DIAGNOSIS — R5383 Other fatigue: Secondary | ICD-10-CM | POA: Diagnosis not present

## 2016-04-08 DIAGNOSIS — L309 Dermatitis, unspecified: Secondary | ICD-10-CM

## 2016-04-08 DIAGNOSIS — D751 Secondary polycythemia: Secondary | ICD-10-CM | POA: Insufficient documentation

## 2016-04-08 DIAGNOSIS — M1611 Unilateral primary osteoarthritis, right hip: Secondary | ICD-10-CM | POA: Diagnosis not present

## 2016-04-08 DIAGNOSIS — D582 Other hemoglobinopathies: Secondary | ICD-10-CM | POA: Insufficient documentation

## 2016-04-08 DIAGNOSIS — Z7982 Long term (current) use of aspirin: Secondary | ICD-10-CM

## 2016-04-08 DIAGNOSIS — G629 Polyneuropathy, unspecified: Secondary | ICD-10-CM

## 2016-04-08 DIAGNOSIS — M5136 Other intervertebral disc degeneration, lumbar region: Secondary | ICD-10-CM | POA: Diagnosis not present

## 2016-04-08 DIAGNOSIS — D45 Polycythemia vera: Secondary | ICD-10-CM

## 2016-04-08 DIAGNOSIS — E538 Deficiency of other specified B group vitamins: Secondary | ICD-10-CM | POA: Insufficient documentation

## 2016-04-08 DIAGNOSIS — F329 Major depressive disorder, single episode, unspecified: Secondary | ICD-10-CM | POA: Diagnosis not present

## 2016-04-08 DIAGNOSIS — Z87891 Personal history of nicotine dependence: Secondary | ICD-10-CM | POA: Diagnosis not present

## 2016-04-08 DIAGNOSIS — N809 Endometriosis, unspecified: Secondary | ICD-10-CM | POA: Insufficient documentation

## 2016-04-08 DIAGNOSIS — Z79899 Other long term (current) drug therapy: Secondary | ICD-10-CM

## 2016-04-08 DIAGNOSIS — R531 Weakness: Secondary | ICD-10-CM | POA: Diagnosis not present

## 2016-04-08 DIAGNOSIS — R609 Edema, unspecified: Secondary | ICD-10-CM | POA: Diagnosis not present

## 2016-04-08 NOTE — Progress Notes (Signed)
Patient ambulates without assistance, brought to exam room 6. BP 180/123, vitals doc  Patient states she's been diagnosed with degenerative disc disease and B-12 deficiency.  Patient also states she started on Baclofen 10mg  TID, but admits she takes it PRN , when she has back spasms.  Medication record updated, information provided by patient.

## 2016-04-08 NOTE — Progress Notes (Signed)
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 I reviewed the patient's prior charts/pertinent labs/imaging in detail; findings are summarized above.   A pleasant 45 year old female patient with above history of isolated erythrocytosis has been referred back to [place] for elevated hemoglobin and hematocrit.  Patient had been having elevated blood pressures/poorly controlled in the last many months. She also noted to have extreme fatigue and intermittent headaches the last few months. Hematocrit most recently was 48.   Patient was seen in the clinic in 2013- received phlebotomies- with seem to help her symptoms of headaches elevated blood pressure.  REVIEW OF SYSTEMS:  A complete 10 point review of system is done which is negative except mentioned above/history of present illness.   PAST MEDICAL HISTORY :  Past Medical History  Diagnosis Date  . Degenerative disc disease, lumbar   . Hypertension   . B12 deficiency   . High mean corpuscular hemoglobin concentration (MCHC)   . Endometriosis   . IBS (irritable bowel syndrome)   . Panic attack   . History of DVT (deep vein thrombosis)   . Depression   . Eczema   . Edema   . Osteoarthritis of right hip     PAST SURGICAL HISTORY :   Past Surgical History  Procedure Laterality Date  . Ankle surgery Right   . Abdominal hysterectomy    . Knee surgery Left 2012  . Miscarriage d&c  1995  . Tympanostomy tube placement  1977    FAMILY HISTORY :   Family History  Problem Relation Age of Onset  .  Hypertension Mother   . Deep vein thrombosis Mother   . Narcolepsy Mother   . Hypertension Father   . Hypertension Maternal Grandmother   . Hypertension Paternal Grandmother     SOCIAL HISTORY:   Social History  Substance Use Topics  . Smoking status: Former Smoker    Types: Cigarettes    Quit date: 11/17/2015  . Smokeless tobacco: Not on file  . Alcohol Use: No    ALLERGIES:  is allergic to atenolol; gabapentin; amlodipine; ibuprofen; latex; meloxicam; metoprolol; nsaids; and paxil cr.  MEDICATIONS:  Current Outpatient Prescriptions  Medication Sig Dispense Refill  . acetaminophen (TYLENOL) 500 MG tablet Take by mouth.    Marland Kitchen aspirin 81 MG tablet Take by mouth.    . baclofen (LIORESAL) 10 MG tablet Take 10 mg by mouth 3 (three) times daily.     . bisoprolol (ZEBETA) 5 MG tablet Take 1 tablet (5 mg total) by mouth daily. 30 tablet 5  . diazepam (VALIUM) 2 MG tablet Take 1 tablet (2 mg total) by mouth every 8 (eight) hours as needed for anxiety. 12 tablet 0  . furosemide (LASIX) 40 MG tablet Take 1 tablet (40 mg total) by mouth daily. As needed for daily for leg swelling 30 tablet 2  . HYDROcodone-acetaminophen (NORCO/VICODIN) 5-325 MG per tablet Take 1 tablet by mouth every 4 (four) hours as needed for moderate pain. 9 tablet 0  . Omega-3 Fatty Acids (FISH OIL EXTRA STRENGTH) 435 MG CAPS  Take by mouth.     No current facility-administered medications for this visit.    PHYSICAL EXAMINATION:   BP 180/123 mmHg  Pulse 61  Temp(Src) 98.4 F (36.9 C) (Tympanic)  Wt 199 lb 11.8 oz (90.6 kg)  Filed Weights   04/08/16 1040  Weight: 199 lb 11.8 oz (90.6 kg)    GENERAL: Well-nourished well-developed; Alert, no distress and comfortable.   Alone. EYES: no pallor or icterus OROPHARYNX: no thrush or ulceration; good dentition  NECK: supple, no masses felt LYMPH:  no palpable lymphadenopathy in the cervical, axillary or inguinal regions LUNGS: clear to auscultation and  No wheeze  or crackles HEART/CVS: regular rate & rhythm and no murmurs; No lower extremity edema ABDOMEN:abdomen soft, non-tender and normal bowel sounds Musculoskeletal:no cyanosis of digits and no clubbing  PSYCH: alert & oriented x 3 with fluent speech NEURO: no focal motor/sensory deficits SKIN:  no rashes or significant lesions  LABORATORY DATA:  I have reviewed the data as listed    Component Value Date/Time   NA 140 03/20/2016 1010   NA 138 09/22/2012 1600   K 4.5 03/20/2016 1010   K 3.9 09/22/2012 1600   CL 97 03/20/2016 1010   CL 103 09/22/2012 1600   CO2 20 03/20/2016 1010   CO2 23 09/22/2012 1600   GLUCOSE 111* 03/20/2016 1010   GLUCOSE 78 09/22/2012 1600   BUN 10 03/20/2016 1010   BUN 12 09/22/2012 1600   CREATININE 0.72 03/20/2016 1010   CREATININE 0.86 09/22/2012 1600   CALCIUM 9.8 03/20/2016 1010   CALCIUM 9.0 09/22/2012 1600   PROT 7.4 03/20/2016 1010   PROT 7.1 09/22/2012 1600   ALBUMIN 4.8 03/20/2016 1010   ALBUMIN 3.6 09/22/2012 1600   AST 15 03/20/2016 1010   AST 12* 09/22/2012 1600   ALT 11 03/20/2016 1010   ALT 18 09/22/2012 1600   ALKPHOS 62 03/20/2016 1010   ALKPHOS 106 09/22/2012 1600   BILITOT 0.3 03/20/2016 1010   BILITOT 0.3 09/22/2012 1600   GFRNONAA 101 03/20/2016 1010   GFRNONAA >60 09/22/2012 1600   GFRAA 117 03/20/2016 1010   GFRAA >60 09/22/2012 1600    No results found for: SPEP, UPEP  Lab Results  Component Value Date   WBC 7.5 03/27/2016   NEUTROABS 4.9 03/27/2016   HGB 14.0 09/22/2012   HCT 48.3* 03/27/2016   MCV 89 03/27/2016   PLT 306 03/27/2016      Chemistry      Component Value Date/Time   NA 140 03/20/2016 1010   NA 138 09/22/2012 1600   K 4.5 03/20/2016 1010   K 3.9 09/22/2012 1600   CL 97 03/20/2016 1010   CL 103 09/22/2012 1600   CO2 20 03/20/2016 1010   CO2 23 09/22/2012 1600   BUN 10 03/20/2016 1010   BUN 12 09/22/2012 1600   CREATININE 0.72 03/20/2016 1010   CREATININE 0.86 09/22/2012 1600      Component  Value Date/Time   CALCIUM 9.8 03/20/2016 1010   CALCIUM 9.0 09/22/2012 1600   ALKPHOS 62 03/20/2016 1010   ALKPHOS 106 09/22/2012 1600   AST 15 03/20/2016 1010   AST 12* 09/22/2012 1600   ALT 11 03/20/2016 1010   ALT 18 09/22/2012 1600   BILITOT 0.3 03/20/2016 1010   BILITOT 0.3 09/22/2012 1600        ASSESSMENT & PLAN:   # 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 Barnabas Lister 2 negative. Recommend phlebotomy as patient's symptoms of headaches elevated blood pressure fatigue  improved post phlebotomy. Recommend phlebotomy 400 mL every 6 months or so; to keep Hct < 45.  Start first one this week.  # hx of DVT [2007]/Heterozygous factor V leiden- Left Knee- stopped coumadin x6 months. No new blood clots.  # smoking/ vaping- again counseled regarding quitting smoking. Patient is not interested.  # B12 deficiency/positive intrinsic factor antibody- on sublingual.   # Elevated Blood pressure- patient will continue to follow up with PCP. Hopefully the phlebotomies should help.  # Patient follow-up with me in approximately 6 months/possible phlebotomy CBC.    Cammie Sickle, MD 04/08/2016 10:57 AM

## 2016-04-10 ENCOUNTER — Other Ambulatory Visit: Payer: Self-pay | Admitting: Internal Medicine

## 2016-04-10 ENCOUNTER — Other Ambulatory Visit: Payer: Self-pay | Admitting: *Deleted

## 2016-04-10 ENCOUNTER — Inpatient Hospital Stay: Payer: BLUE CROSS/BLUE SHIELD

## 2016-04-10 VITALS — BP 181/117 | HR 60 | Resp 20

## 2016-04-10 DIAGNOSIS — D45 Polycythemia vera: Secondary | ICD-10-CM

## 2016-04-10 DIAGNOSIS — D751 Secondary polycythemia: Secondary | ICD-10-CM | POA: Diagnosis not present

## 2016-04-10 NOTE — Progress Notes (Signed)
Per infusion in cancer ctr, Patient only had 200 mls drawn from therapeutic phlebotomy today. Per infusion, pt was stuck multiple times for IV access.  RN spoke with md. Order obtained from MD to have patient come back in 1 month for lab/possible phlebotomy (H&H). Msg sent to cancer center scheduling to arrange.

## 2016-04-10 NOTE — Progress Notes (Signed)
Dr Rogue Bussing notified of 213ml blood removed instead of 453ml as ordered. Pt to return in one month for followup. BP elevated, MD aware. Pt knows to follow up with PCP regarding BP

## 2016-04-25 ENCOUNTER — Ambulatory Visit (INDEPENDENT_AMBULATORY_CARE_PROVIDER_SITE_OTHER): Payer: BLUE CROSS/BLUE SHIELD | Admitting: Family Medicine

## 2016-04-25 VITALS — BP 154/102 | HR 64 | Temp 99.0°F | Resp 16 | Wt 199.0 lb

## 2016-04-25 DIAGNOSIS — M5136 Other intervertebral disc degeneration, lumbar region: Secondary | ICD-10-CM

## 2016-04-25 DIAGNOSIS — M545 Low back pain: Secondary | ICD-10-CM | POA: Diagnosis not present

## 2016-04-25 MED ORDER — HYDROCODONE-ACETAMINOPHEN 5-325 MG PO TABS: 1.0000 | ORAL_TABLET | Freq: Four times a day (QID) | ORAL | Status: DC | PRN

## 2016-04-25 MED ORDER — PREDNISONE 10 MG PO TABS: ORAL_TABLET | ORAL | Status: AC

## 2016-04-25 MED ORDER — METHYLPREDNISOLONE ACETATE 80 MG/ML IJ SUSP
80.0000 mg | Freq: Once | INTRAMUSCULAR | Status: AC
  Administered 2016-04-25: 80 mg via INTRAMUSCULAR

## 2016-04-25 NOTE — Progress Notes (Signed)
Patient ID: Michelle Simpson, female   DOB: November 30, 1970, 45 y.o.   MRN: [id number]        [name] Female    DOB: 1971-08-31   45 y.o.   MRN: [id number] Visit Date: 04/25/2016  Today's Provider: Lelon Huh, MD   Chief Complaint  Patient presents with  . Back Pain   Subjective:    Back Pain This is a chronic problem. The problem occurs constantly. The problem has been rapidly worsening (Recent flare for about a week; but worsening since last night. ) since onset. The pain is present in the lumbar spine. The quality of the pain is described as aching, burning and shooting. Radiates to: Right Hip and leg. The pain is at a severity of 8/10. The pain is severe. The pain is the same all the time (But gets worse as the day goes on.). The symptoms are aggravated by sitting and standing (Sitting or standing in the same position too long. ). Stiffness is present all day. Associated symptoms include leg pain, numbness and tingling. Pertinent negatives include no abdominal pain, bladder incontinence, bowel incontinence, chest pain, dysuria, fever or headaches.  She had MRI L spine in November with moderate disk disease of lumbar spine. she is scheduled at pain cilnic on 04/30/16.   She reports that she typical has similar flare ups a few times each year and they usually respond well to steroid injection and oral taper. She was in ER about this time last year and prescribed hydrocodone which she states is just now running out.      Allergies  Allergen Reactions  . Atenolol Hives    Urticaria.  . Gabapentin Swelling  . Amlodipine Swelling  . Ibuprofen Other (See Comments)    With prolonged use swelling, HTN and congestion.  . Latex Hives  . Meloxicam     Other reaction(s): Other (See Comments) Upset stomach.  . Metoprolol Hives  . Nsaids Swelling  . Paxil Cr [Paroxetine Hcl Er] Other (See Comments)    hallucination    Current Outpatient Prescriptions  Medication Sig Dispense  Refill  . acetaminophen (TYLENOL) 500 MG tablet Take by mouth.    Marland Kitchen aspirin 81 MG tablet Take by mouth.    . baclofen (LIORESAL) 10 MG tablet Take 10 mg by mouth 3 (three) times daily.     . bisoprolol (ZEBETA) 5 MG tablet Take 1 tablet (5 mg total) by mouth daily. 30 tablet 5  . diazepam (VALIUM) 2 MG tablet Take 1 tablet (2 mg total) by mouth every 8 (eight) hours as needed for anxiety. 12 tablet 0  . furosemide (LASIX) 40 MG tablet Take 1 tablet (40 mg total) by mouth daily. As needed for daily for leg swelling 30 tablet 2  . HYDROcodone-acetaminophen (NORCO/VICODIN) 5-325 MG per tablet Take 1 tablet by mouth every 4 (four) hours as needed for moderate pain. 9 tablet 0  . Omega-3 Fatty Acids (FISH OIL EXTRA STRENGTH) 435 MG CAPS Take by mouth.     No current facility-administered medications for this visit.    Review of Systems  Constitutional: Negative.  Negative for fever.  Respiratory: Negative.   Cardiovascular: Negative.  Negative for chest pain.  Gastrointestinal: Negative.  Negative for abdominal pain and bowel incontinence.  Genitourinary: Negative for bladder incontinence and dysuria.  Musculoskeletal: Positive for myalgias and back pain. Negative for joint swelling, arthralgias, neck pain and neck stiffness.  Neurological: Positive for tingling and numbness. Negative for dizziness and  headaches.    Social History  Substance Use Topics  . Smoking status: Former Smoker -- 1.00 packs/day for 20 years    Types: Cigarettes    Quit date: 11/17/2015  . Smokeless tobacco: Never Used     Comment: Currently 'vapes' nicotine  . Alcohol Use: 0.0 oz/week    0 Standard drinks or equivalent per week     Comment: 'ocassional glass of wine"   Objective:   BP 154/102 mmHg  Pulse 64  Temp(Src) 99 F (37.2 C) (Oral)  Resp 16  Wt 199 lb (90.266 kg)  Physical Exam  General appearance: alert, well developed, well nourished, cooperative and in no distress Head: Normocephalic,  without obvious abnormality, atraumatic Respiratory: Respirations even and unlabored, normal respiratory rate MS: Moderate tenderness along lumbar spine and right paralumbar muscles. Tender right greater trochanter. Normal LE strength and tone.  Neurologic: Mental status: Alert, oriented to person, place, and time, thought content appropriate.     Assessment & Plan:     1. DDD (degenerative disc disease), lumbar Has done well previous episodes with steroid injection followed by prednisone taper.  - HYDROcodone-acetaminophen (NORCO/VICODIN) 5-325 MG tablet; Take 1 tablet by mouth every 6 (six) hours as needed for moderate pain.  Dispense: 10 tablet; Refill: 0 - methylPREDNISolone acetate (DEPO-MEDROL) injection 80 mg; Inject 1 mL (80 mg total) into the muscle once.  2. Right low back pain, with sciatica presence unspecified  - predniSONE (DELTASONE) 10 MG tablet; 6 tablets for 1 day, then 5 for 1 day, then 4 for 1 day, then 3 for 1 day, then 2 for 1 day then 1 for 1 day.  Dispense: 21 tablet; Refill: 0 - methylPREDNISolone acetate (DEPO-MEDROL) injection 80 mg; Inject 1 mL (80 mg total) into the muscle once.     The entirety of the information documented in the History of Present Illness, Review of Systems and Physical Exam were personally obtained by me. Portions of this information were initially documented by Ashley Royalty, CMA and reviewed by me for thoroughness and accuracy.       Lelon Huh, MD  [facility]

## 2016-04-29 DIAGNOSIS — K589 Irritable bowel syndrome without diarrhea: Secondary | ICD-10-CM | POA: Insufficient documentation

## 2016-05-08 ENCOUNTER — Other Ambulatory Visit: Payer: BLUE CROSS/BLUE SHIELD

## 2016-05-08 ENCOUNTER — Ambulatory Visit: Payer: BLUE CROSS/BLUE SHIELD | Admitting: Internal Medicine

## 2016-05-08 ENCOUNTER — Inpatient Hospital Stay: Payer: BLUE CROSS/BLUE SHIELD | Attending: Internal Medicine

## 2016-05-08 ENCOUNTER — Inpatient Hospital Stay: Payer: BLUE CROSS/BLUE SHIELD

## 2016-05-08 DIAGNOSIS — D45 Polycythemia vera: Secondary | ICD-10-CM

## 2016-05-08 LAB — HEMATOCRIT: HCT: 49.3 % — ABNORMAL HIGH (ref 35.0–47.0)

## 2016-05-08 LAB — HEMOGLOBIN: Hemoglobin: 17 g/dL — ABNORMAL HIGH (ref 12.0–16.0)

## 2016-07-15 DIAGNOSIS — M47816 Spondylosis without myelopathy or radiculopathy, lumbar region: Secondary | ICD-10-CM | POA: Insufficient documentation

## 2016-08-18 ENCOUNTER — Ambulatory Visit (INDEPENDENT_AMBULATORY_CARE_PROVIDER_SITE_OTHER): Payer: BLUE CROSS/BLUE SHIELD | Admitting: Family Medicine

## 2016-08-18 ENCOUNTER — Encounter: Payer: Self-pay | Admitting: Family Medicine

## 2016-08-18 ENCOUNTER — Telehealth: Payer: Self-pay | Admitting: Family Medicine

## 2016-08-18 VITALS — BP 120/80 | HR 70 | Temp 98.3°F | Resp 16 | Wt 193.6 lb

## 2016-08-18 DIAGNOSIS — G4452 New daily persistent headache (NDPH): Secondary | ICD-10-CM

## 2016-08-18 MED ORDER — HYDROCODONE-ACETAMINOPHEN 5-325 MG PO TABS: ORAL_TABLET | ORAL | 0 refills | Status: DC

## 2016-08-18 MED ORDER — PROMETHAZINE HCL 25 MG PO TABS: 25.0000 mg | ORAL_TABLET | Freq: Four times a day (QID) | ORAL | 0 refills | Status: DC | PRN

## 2016-08-18 NOTE — Progress Notes (Addendum)
Subjective:     Patient ID: Michelle Simpson, female   DOB: Feb 23, 1971, 45 y.o.   MRN: [id number]  HPI  Chief Complaint  Patient presents with  . Headache    Patient has been having this headache for 3 weeks and now it has been constant for 12 days. She reports that her pain is a 7/10. When is really bad she feel nausea. Tried: Tylenol for pain.She reports she does feel like her balanced is off, dizzy. No chest pain, fever,fatigue, palpitations.  States headache has been progressive: "I feel like the top of my head is going to blow off." Describes both pressure and throbbing from her temples to parietal area. Gets mild improvement taking up to 4000 mg.Tylenol daily. No prior headache syndromes or recent eye exam. Denies unusual stress. Drinks 40 oz of green tea daily. She has initiated treatment with the [facility] pain clinic for her back and knee pains. She is seeing their team psychotherapist as well.States she has not experienced headaches with her polycythemia in the past.She is unable to take nsaid's.  Review of Systems     Objective:   Physical Exam  Constitutional: She appears well-developed and well-nourished. No distress.  Eyes: EOM are normal. Pupils are equal, round, and reactive to light.  Neck: Carotid bruit is not present.  Musculoskeletal:  Grip strength 5/5  Neurological: Coordination (finger to nose; heel to shin WNL; Romberg negative) normal.       Assessment:    1. New daily persistent headache: r/o intracranial abnormality; ? Rebound headache ? Due to changes in vision - CT Head Wo Contrast; Future - HYDROcodone-acetaminophen (NORCO/VICODIN) 5-325 MG tablet; Take one at bedtime with promethazine for headache.  Dispense: 6 tablet; Refill: 0 - promethazine (PHENERGAN) 25 MG tablet; Take 1 tablet (25 mg total) by mouth every 6 (six) hours as needed for nausea.  Dispense: 12 tablet; Refill: 0      Plan:    Encourage vision exam. Further f/u pending CT scan.

## 2016-08-18 NOTE — Patient Instructions (Addendum)
Get vision checked. We will call you with the CT scan time. Keep track of total Tylenol dose over 24 hours. Keep at less than 4000 mg/24 hours.

## 2016-08-18 NOTE — Telephone Encounter (Signed)
Call [employer] to get authorization for CT, they need for you to call [phone] to do a peer to peer to finish out the authorization. Her appt for CT is Friday

## 2016-08-18 NOTE — Telephone Encounter (Signed)
Authorization # [id number]  For her CT scan

## 2016-08-19 ENCOUNTER — Other Ambulatory Visit: Payer: Self-pay | Admitting: Family Medicine

## 2016-08-19 DIAGNOSIS — G4452 New daily persistent headache (NDPH): Secondary | ICD-10-CM

## 2016-08-19 DIAGNOSIS — D751 Secondary polycythemia: Secondary | ICD-10-CM

## 2016-08-21 ENCOUNTER — Other Ambulatory Visit: Payer: Self-pay | Admitting: Family Medicine

## 2016-08-21 ENCOUNTER — Ambulatory Visit
Admission: RE | Admit: 2016-08-21 | Discharge: 2016-08-21 | Disposition: A | Discharge status: Home / Self Care | Payer: BLUE CROSS/BLUE SHIELD | Source: Ambulatory Visit | Attending: Family Medicine | Admitting: Family Medicine

## 2016-08-21 DIAGNOSIS — G4452 New daily persistent headache (NDPH): Secondary | ICD-10-CM | POA: Insufficient documentation

## 2016-08-22 LAB — CBC WITH DIFFERENTIAL/PLATELET
BASOS ABS: 0.1 10*3/uL (ref 0.0–0.2)
BASOS: 1 %
EOS (ABSOLUTE): 0.2 10*3/uL (ref 0.0–0.4)
EOS: 2 %
HEMATOCRIT: 42.3 % (ref 34.0–46.6)
HEMOGLOBIN: 15.1 g/dL (ref 11.1–15.9)
IMMATURE GRANS (ABS): 0 10*3/uL (ref 0.0–0.1)
Immature Granulocytes: 0 %
LYMPHS ABS: 3.1 10*3/uL (ref 0.7–3.1)
LYMPHS: 31 %
MCH: 30.8 pg (ref 26.6–33.0)
MCHC: 35.7 g/dL (ref 31.5–35.7)
MCV: 86 fL (ref 79–97)
MONOCYTES: 5 %
Monocytes Absolute: 0.5 10*3/uL (ref 0.1–0.9)
NEUTROS ABS: 6 10*3/uL (ref 1.4–7.0)
Neutrophils: 61 %
Platelets: 366 10*3/uL (ref 150–379)
RBC: 4.91 x10E6/uL (ref 3.77–5.28)
RDW: 13.3 % (ref 12.3–15.4)
WBC: 9.9 10*3/uL (ref 3.4–10.8)

## 2016-08-31 ENCOUNTER — Telehealth: Payer: Self-pay

## 2016-08-31 NOTE — Telephone Encounter (Signed)
Spoke with patient on the phone and advised as below, she states that she has not set up vision exam but will do so here in the next couple of weeks. Patient states that she has had changes with her headache due to the [place] weather. Patient states headache has now moved to the right side of her head and radiates into her neck, patient describes pain as a shooting pain. Patient believes that headaches could be related to pain she is having in her neck. KW

## 2016-08-31 NOTE — Telephone Encounter (Signed)
Does she wish to try prednisone for a few days and see if that helps?

## 2016-08-31 NOTE — Telephone Encounter (Signed)
-----   Message from Carmon Ginsberg, [place] sent at 08/29/2016  8:41 AM EDT ----- Blood count ok. Have you had your vision exam updated?

## 2016-09-01 ENCOUNTER — Other Ambulatory Visit: Payer: Self-pay | Admitting: Family Medicine

## 2016-09-01 DIAGNOSIS — R51 Headache: Principal | ICD-10-CM

## 2016-09-01 DIAGNOSIS — G8929 Other chronic pain: Secondary | ICD-10-CM

## 2016-09-01 DIAGNOSIS — R519 Headache, unspecified: Secondary | ICD-10-CM

## 2016-09-01 MED ORDER — PREDNISONE 20 MG PO TABS: ORAL_TABLET | ORAL | 0 refills | Status: DC

## 2016-09-01 NOTE — Telephone Encounter (Signed)
Advised patient and she would like to start prednisone.Patient ask that you send prescription to [place]. KW

## 2016-09-01 NOTE — Telephone Encounter (Signed)
Prednisone rx sent in

## 2016-09-10 ENCOUNTER — Other Ambulatory Visit: Payer: Self-pay | Admitting: Family Medicine

## 2016-09-10 DIAGNOSIS — I10 Essential (primary) hypertension: Secondary | ICD-10-CM

## 2016-10-11 ENCOUNTER — Encounter: Payer: Self-pay | Admitting: Emergency Medicine

## 2016-10-11 ENCOUNTER — Emergency Department: Payer: BLUE CROSS/BLUE SHIELD

## 2016-10-11 ENCOUNTER — Emergency Department
Admission: EM | Admit: 2016-10-11 | Discharge: 2016-10-11 | Disposition: A | Discharge status: Home / Self Care | Payer: BLUE CROSS/BLUE SHIELD | Attending: Emergency Medicine | Admitting: Emergency Medicine

## 2016-10-11 DIAGNOSIS — Z7982 Long term (current) use of aspirin: Secondary | ICD-10-CM | POA: Diagnosis not present

## 2016-10-11 DIAGNOSIS — G8929 Other chronic pain: Secondary | ICD-10-CM

## 2016-10-11 DIAGNOSIS — M545 Low back pain, unspecified: Secondary | ICD-10-CM

## 2016-10-11 DIAGNOSIS — Z91040 Latex allergy status: Secondary | ICD-10-CM | POA: Diagnosis not present

## 2016-10-11 DIAGNOSIS — I10 Essential (primary) hypertension: Secondary | ICD-10-CM | POA: Diagnosis not present

## 2016-10-11 DIAGNOSIS — Z79899 Other long term (current) drug therapy: Secondary | ICD-10-CM | POA: Insufficient documentation

## 2016-10-11 DIAGNOSIS — F17290 Nicotine dependence, other tobacco product, uncomplicated: Secondary | ICD-10-CM | POA: Insufficient documentation

## 2016-10-11 HISTORY — DX: Unspecified osteoarthritis, unspecified site: M19.90

## 2016-10-11 MED ORDER — CYCLOBENZAPRINE HCL 10 MG PO TABS
5.0000 mg | ORAL_TABLET | Freq: Once | ORAL | Status: AC
  Administered 2016-10-11: 5 mg via ORAL
  Filled 2016-10-11: qty 1

## 2016-10-11 MED ORDER — HYDROCODONE-ACETAMINOPHEN 5-325 MG PO TABS: 1.0000 | ORAL_TABLET | ORAL | 0 refills | Status: DC | PRN

## 2016-10-11 MED ORDER — HYDROCODONE-ACETAMINOPHEN 5-325 MG PO TABS
2.0000 | ORAL_TABLET | Freq: Once | ORAL | Status: AC
Start: 2016-10-11 — End: 2016-10-11
  Administered 2016-10-11: 2 via ORAL
  Filled 2016-10-11: qty 2

## 2016-10-11 MED ORDER — CYCLOBENZAPRINE HCL 5 MG PO TABS: 5.0000 mg | ORAL_TABLET | Freq: Three times a day (TID) | ORAL | 0 refills | Status: DC | PRN

## 2016-10-11 NOTE — ED Provider Notes (Signed)
[facility] Emergency Department Provider Note  ____________________________________________   First MD Initiated Contact with Patient 10/11/16 1418     (approximate)  I have reviewed the triage vital signs and the nursing notes.   HISTORY  Chief Complaint Back Pain   HPI Michelle Simpson is a 45 y.o. female is here with complaint of low back pain. Patient states that she currently has been seeing the spine Center in [place] for her back. She states that she has not had a recent injury and denies any urinary symptoms. She is currently not taking any pain medication from her doctor. Patient states that any movement increases her pain. She denies any fever, chills, nausea, vomiting, dysuria. She denies any paresthesias to her lower extremities or incontinence of bowel or bladder. She rates her pain as 7 out of 10.   Past Medical History:  Diagnosis Date  . B12 deficiency   . Degenerative disc disease, lumbar   . Depression   . DJD (degenerative joint disease)   . Eczema   . Edema   . Endometriosis   . High mean corpuscular hemoglobin concentration (MCHC)   . History of DVT (deep vein thrombosis)   . Hypertension   . IBS (irritable bowel syndrome)   . Osteoarthritis of right hip   . Panic attack     Patient Active Problem List   Diagnosis Date Noted  . Polycythemia vera ([place]) 03/30/2016  . Chronic pelvic pain in female 03/20/2016  . Allergic rhinitis 03/20/2016  . Hyperlipidemia 03/20/2016  . Osteoarthritis of right hip 03/20/2016  . Heterozygous factor V Leiden mutation ([place]) 03/20/2016  . Personal history of DVT (deep vein thrombosis) 03/20/2016  . Primary osteoarthritis of both knees 10/23/2014  . DDD (degenerative disc disease), lumbar 10/04/2014  . Neuritis or radiculitis due to rupture of lumbar intervertebral disc 09/12/2014  . Benign essential HTN 09/05/2014  . Endometriosis 05/06/2010    Past Surgical History:  Procedure  Laterality Date  . ABDOMINAL HYSTERECTOMY    . ANKLE SURGERY Right   . KNEE SURGERY Left 2012  . miscarriage D&C  1995  . TYMPANOSTOMY TUBE PLACEMENT  1977    Prior to Admission medications   Medication Sig Start Date End Date Taking? Authorizing Provider  acetaminophen (TYLENOL) 500 MG tablet Take by mouth.    Historical Provider, MD  Alpha-Lipoic Acid 600 MG CAPS Take 600 mg by mouth. 07/15/16   Historical Provider, MD  aspirin 81 MG tablet Take by mouth. 08/29/12   Historical Provider, MD  baclofen (LIORESAL) 10 MG tablet Take 10 mg by mouth 3 (three) times daily.     Historical Provider, MD  bisoprolol (ZEBETA) 5 MG tablet TAKE 1 TABLET(5 MG) BY MOUTH DAILY 09/10/16   Carmon Ginsberg, PA  cyclobenzaprine (FLEXERIL) 5 MG tablet Take 1 tablet (5 mg total) by mouth 3 (three) times daily as needed for muscle spasms. 10/11/16   Johnn Hai, PA-C  furosemide (LASIX) 40 MG tablet Take 1 tablet (40 mg total) by mouth daily. As needed for daily for leg swelling 03/20/16 03/20/17  Carmon Ginsberg, PA  HYDROcodone-acetaminophen (NORCO/VICODIN) 5-325 MG tablet Take 1 tablet by mouth every 4 (four) hours as needed for moderate pain. 10/11/16   Johnn Hai, PA-C  Omega-3 Fatty Acids (FISH OIL EXTRA STRENGTH) 435 MG CAPS Take by mouth. 08/29/12   Historical Provider, MD  promethazine (PHENERGAN) 25 MG tablet Take 1 tablet (25 mg total) by mouth every 6 (six)  hours as needed for nausea. 08/18/16   Carmon Ginsberg, PA  topiramate (TOPAMAX) 50 MG tablet  07/15/16   Historical Provider, MD    Allergies Atenolol; Gabapentin; Amlodipine; Ibuprofen; Latex; Meloxicam; Metoprolol; Nsaids; and Paxil cr [paroxetine hcl er]  Family History  Problem Relation Age of Onset  . Hypertension Father   . Hypertension Mother   . Deep vein thrombosis Mother   . Narcolepsy Mother   . Hypertension Maternal Grandmother   . Hypertension Paternal Grandmother     Social History Social History  Substance Use Topics    . Smoking status: Former Smoker    Packs/day: 1.00    Years: 20.00    Types: Cigarettes    Quit date: 11/17/2015  . Smokeless tobacco: Never Used     Comment: Currently 'vapes' nicotine  . Alcohol use 0.0 oz/week     Comment: 'ocassional glass of wine"    Review of Systems Constitutional: No fever/chills Cardiovascular: Denies chest pain. Respiratory: Denies shortness of breath. Gastrointestinal:   No nausea, no vomiting.   Genitourinary: Negative for dysuria. Musculoskeletal:Positive for chronic back pain. Skin: Negative for rash. Neurological: Negative for headaches, focal weakness or numbness.  10-point ROS otherwise negative.  ____________________________________________   PHYSICAL EXAM:  VITAL SIGNS: ED Triage Vitals [10/11/16 1348]  Enc Vitals Group     BP (!) 160/104     Pulse Rate 62     Resp 18     Temp 98.6 F (37 C)     Temp src      SpO2 97 %     Weight 190 lb (86.2 kg)     Height 5' 6.5" (1.689 m)     Head Circumference      Peak Flow      Pain Score 7     Pain Loc      Pain Edu?      Excl. in [place]?     Constitutional: Alert and oriented. Well appearing and in no acute distress. Eyes: Conjunctivae are normal. PERRL. EOMI. Head: Atraumatic. Nose: No congestion/rhinnorhea. Neck: No stridor.   Cardiovascular: Normal rate, regular rhythm. Grossly normal heart sounds.  Good peripheral circulation. Respiratory: Normal respiratory effort.  No retractions. Lungs CTAB. Musculoskeletal: Moves upper and lower extremities without any difficulty. On examination of the back there is no gross deformity noted. There is tenderness on palpation at L5-S1 area and paravertebral muscles left greater than the right. No active muscle spasms were seen however patient had difficulty with movement secondary to pain. Patient has good muscle strength lower extremities bilaterally. Neurologic:  Normal speech and language. No gross focal neurologic deficits are appreciated.  Reflexes 2+ bilaterally. Skin:  Skin is warm, dry and intact. No rash noted. Psychiatric: Mood and affect are normal. Speech and behavior are normal.  ____________________________________________   LABS (all labs ordered are listed, but only abnormal results are displayed)  Labs Reviewed - No data to display   RADIOLOGY Lumbar spine x-ray per radiologist is negative. I, Johnn Hai, personally viewed and evaluated these images (plain radiographs) as part of my medical decision making, as well as reviewing the written report by the radiologist.  ____________________________________________   PROCEDURES  Procedure(s) performed: None  Procedures  Critical Care performed: No  ____________________________________________   INITIAL IMPRESSION / ASSESSMENT AND PLAN / ED COURSE  Pertinent labs & imaging results that were available during my care of the patient were reviewed by me and considered in my medical decision making (see chart for  details).    Clinical Course    Patient was given Norco and Flexeril for pain while in the emergency room. X-rays were negative for any acute findings. Patient is to follow-up with her primary care doctor or her doctor at the spine Center in [place]. She is also given a prescription for Flexeril 3 times a day as needed for muscle spasms and Norco as needed for severe pain. She is encouraged to use ice or heat to her back for comfort.   ____________________________________________   FINAL CLINICAL IMPRESSION(S) / ED DIAGNOSES  Final diagnoses:  Acute left-sided low back pain without sciatica  Chronic low back pain without sciatica, unspecified back pain laterality      NEW MEDICATIONS STARTED DURING THIS VISIT:  New Prescriptions   CYCLOBENZAPRINE (FLEXERIL) 5 MG TABLET    Take 1 tablet (5 mg total) by mouth 3 (three) times daily as needed for muscle spasms.   HYDROCODONE-ACETAMINOPHEN (NORCO/VICODIN) 5-325 MG TABLET    Take  1 tablet by mouth every 4 (four) hours as needed for moderate pain.     Note:  This document was prepared using Dragon voice recognition software and may include unintentional dictation errors.    Johnn Hai, PA-C 10/11/16 1533    Merlyn Lot, MD 10/11/16 [phone]

## 2016-10-11 NOTE — Discharge Instructions (Signed)
Follow-up with your primary care doctor or your doctor at the spine center. Ice or heat to her back as needed for comfort. Flexeril 5 mg 1 tablet 3 times a day. Be aware that this medication could cause stress and increased your risk for falling.  Norco as needed for pain

## 2016-10-11 NOTE — ED Notes (Signed)
See triage note   States she has degenerative disc disease and thinks she over did it on Thursday. Having pain from neck into shoul;ders/ars   And mid back pain which radiates into both legs    Describes pain as pins and needles  Ambulates slowly d/t increased pain

## 2016-10-11 NOTE — ED Triage Notes (Signed)
Goes to [facility]

## 2016-10-11 NOTE — ED Triage Notes (Signed)
States tailbone pain that goes up the back and causes pins and needle sensation in hands, mouth, both legs

## 2016-10-11 NOTE — ED Notes (Signed)
Pt ambulated with slow but steady gait to the flex waiting area

## 2016-10-14 ENCOUNTER — Inpatient Hospital Stay (HOSPITAL_BASED_OUTPATIENT_CLINIC_OR_DEPARTMENT_OTHER): Payer: BLUE CROSS/BLUE SHIELD | Admitting: Internal Medicine

## 2016-10-14 ENCOUNTER — Inpatient Hospital Stay: Payer: BLUE CROSS/BLUE SHIELD | Attending: Internal Medicine

## 2016-10-14 ENCOUNTER — Inpatient Hospital Stay: Payer: BLUE CROSS/BLUE SHIELD

## 2016-10-14 ENCOUNTER — Encounter: Payer: Self-pay | Admitting: Internal Medicine

## 2016-10-14 VITALS — BP 116/85

## 2016-10-14 VITALS — BP 121/84 | HR 57 | Temp 96.5°F | Wt 199.5 lb

## 2016-10-14 DIAGNOSIS — D751 Secondary polycythemia: Secondary | ICD-10-CM | POA: Insufficient documentation

## 2016-10-14 DIAGNOSIS — D6851 Activated protein C resistance: Secondary | ICD-10-CM | POA: Diagnosis not present

## 2016-10-14 DIAGNOSIS — Z79899 Other long term (current) drug therapy: Secondary | ICD-10-CM | POA: Diagnosis not present

## 2016-10-14 DIAGNOSIS — M1990 Unspecified osteoarthritis, unspecified site: Secondary | ICD-10-CM | POA: Diagnosis not present

## 2016-10-14 DIAGNOSIS — F410 Panic disorder [episodic paroxysmal anxiety] without agoraphobia: Secondary | ICD-10-CM

## 2016-10-14 DIAGNOSIS — M5136 Other intervertebral disc degeneration, lumbar region: Secondary | ICD-10-CM

## 2016-10-14 DIAGNOSIS — N809 Endometriosis, unspecified: Secondary | ICD-10-CM | POA: Insufficient documentation

## 2016-10-14 DIAGNOSIS — R51 Headache: Secondary | ICD-10-CM

## 2016-10-14 DIAGNOSIS — E538 Deficiency of other specified B group vitamins: Secondary | ICD-10-CM | POA: Diagnosis not present

## 2016-10-14 DIAGNOSIS — L309 Dermatitis, unspecified: Secondary | ICD-10-CM

## 2016-10-14 DIAGNOSIS — K589 Irritable bowel syndrome without diarrhea: Secondary | ICD-10-CM | POA: Diagnosis not present

## 2016-10-14 DIAGNOSIS — R5383 Other fatigue: Secondary | ICD-10-CM | POA: Insufficient documentation

## 2016-10-14 DIAGNOSIS — Z87891 Personal history of nicotine dependence: Secondary | ICD-10-CM

## 2016-10-14 DIAGNOSIS — G629 Polyneuropathy, unspecified: Secondary | ICD-10-CM | POA: Diagnosis not present

## 2016-10-14 DIAGNOSIS — F329 Major depressive disorder, single episode, unspecified: Secondary | ICD-10-CM | POA: Diagnosis not present

## 2016-10-14 DIAGNOSIS — I10 Essential (primary) hypertension: Secondary | ICD-10-CM | POA: Insufficient documentation

## 2016-10-14 DIAGNOSIS — Z86718 Personal history of other venous thrombosis and embolism: Secondary | ICD-10-CM

## 2016-10-14 DIAGNOSIS — D45 Polycythemia vera: Secondary | ICD-10-CM | POA: Insufficient documentation

## 2016-10-14 DIAGNOSIS — Z7982 Long term (current) use of aspirin: Secondary | ICD-10-CM | POA: Insufficient documentation

## 2016-10-14 LAB — CBC WITH DIFFERENTIAL/PLATELET
BASOS ABS: 0.1 10*3/uL (ref 0–0.1)
BASOS PCT: 1 %
EOS ABS: 0.1 10*3/uL (ref 0–0.7)
Eosinophils Relative: 1 %
HEMATOCRIT: 44.9 % (ref 35.0–47.0)
HEMOGLOBIN: 15.3 g/dL (ref 12.0–16.0)
Lymphocytes Relative: 22 %
Lymphs Abs: 1.9 10*3/uL (ref 1.0–3.6)
MCH: 30.3 pg (ref 26.0–34.0)
MCHC: 34.2 g/dL (ref 32.0–36.0)
MCV: 88.7 fL (ref 80.0–100.0)
MONO ABS: 0.4 10*3/uL (ref 0.2–0.9)
Monocytes Relative: 5 %
NEUTROS ABS: 6.1 10*3/uL (ref 1.4–6.5)
NEUTROS PCT: 71 %
Platelets: 307 10*3/uL (ref 150–440)
RBC: 5.06 MIL/uL (ref 3.80–5.20)
RDW: 14.2 % (ref 11.5–14.5)
WBC: 8.6 10*3/uL (ref 3.6–11.0)

## 2016-10-14 NOTE — Assessment & Plan Note (Signed)
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 Recommend phlebotomy as patient's symptoms of headaches elevated blood pressure fatigue improved post phlebotomy.   Recommend phlebotomy 400 mL every 6 months or so; to keep Hct < 44. Today- 44.9; proceed with phlebotomy today.   # hx of DVT [2007]/Heterozygous factor V leiden- Left Knee- stopped coumadin x6 months. No new blood clots. On asprin.    # B12 deficiency/positive intrinsic factor antibody- on sublingual.    # Patient follow-up with me in approximately 6 months/possible phlebotomy CBC.

## 2016-10-14 NOTE — Progress Notes (Signed)
[place] OFFICE PROGRESS NOTE  Patient Care Team: Carmon Ginsberg, [place] as PCP - General (Family Medicine)   SUMMARY OF HEMATOLOGIC/ONCOLOGIC HISTORY:  # 2013-ERYTHROCYTOSIS- likely secondary - Normal Epo/ Jak-2 NEG; symptomatic  # 2007- LE DVT- coumadin x 6 M; Heterozygous Factor V leiden-   # B12 def/ intrinsic factor Ab/ Peripheral neuropathy- sub ling B12  INTERVAL HISTORY:  A pleasant 45 year old female patient with above history of isolated erythrocytosis is here for follow-up.  She notes to have increasing fatigue and intermittent headaches in the last few days. She last received phlebotomy approximately 6 months ago. Patient's symptoms improved post phlebotomy. Denies any new blood clots.   REVIEW OF SYSTEMS:  A complete 10 point review of system is done which is negative except mentioned above/history of present illness.   PAST MEDICAL HISTORY :  Past Medical History:  Diagnosis Date  . B12 deficiency   . Degenerative disc disease, lumbar   . Depression   . DJD (degenerative joint disease)   . Eczema   . Edema   . Endometriosis   . High mean corpuscular hemoglobin concentration (MCHC)   . History of DVT (deep vein thrombosis)   . Hypertension   . IBS (irritable bowel syndrome)   . Osteoarthritis of right hip   . Panic attack     PAST SURGICAL HISTORY :   Past Surgical History:  Procedure Laterality Date  . ABDOMINAL HYSTERECTOMY    . ANKLE SURGERY Right   . KNEE SURGERY Left 2012  . miscarriage D&C  1995  . TYMPANOSTOMY TUBE PLACEMENT  1977    FAMILY HISTORY :   Family History  Problem Relation Age of Onset  . Hypertension Father   . Hypertension Mother   . Deep vein thrombosis Mother   . Narcolepsy Mother   . Hypertension Maternal Grandmother   . Hypertension Paternal Grandmother     SOCIAL HISTORY:   Social History  Substance Use Topics  . Smoking status: Former Smoker    Packs/day: 1.00    Years: 20.00    Types: Cigarettes     Quit date: 11/17/2015  . Smokeless tobacco: Never Used     Comment: Currently 'vapes' nicotine  . Alcohol use 0.0 oz/week     Comment: 'ocassional glass of wine"    ALLERGIES:  is allergic to atenolol; gabapentin; amlodipine; ibuprofen; latex; meloxicam; metoprolol; nsaids; and paxil cr [paroxetine hcl er].  MEDICATIONS:  Current Outpatient Prescriptions  Medication Sig Dispense Refill  . acetaminophen (TYLENOL) 500 MG tablet Take by mouth.    . Alpha-Lipoic Acid 600 MG CAPS Take 600 mg by mouth.    Marland Kitchen aspirin 81 MG tablet Take by mouth.    . bisoprolol (ZEBETA) 5 MG tablet TAKE 1 TABLET(5 MG) BY MOUTH DAILY 90 tablet 3  . Cyanocobalamin (B-12) 3000 MCG SUBL Place under the tongue.    . cyclobenzaprine (FLEXERIL) 5 MG tablet Take 1 tablet (5 mg total) by mouth 3 (three) times daily as needed for muscle spasms. 12 tablet 0  . furosemide (LASIX) 40 MG tablet Take 1 tablet (40 mg total) by mouth daily. As needed for daily for leg swelling 30 tablet 2  . HYDROcodone-acetaminophen (NORCO/VICODIN) 5-325 MG tablet Take 1 tablet by mouth every 4 (four) hours as needed for moderate pain. 15 tablet 0  . Omega-3 Fatty Acids (FISH OIL EXTRA STRENGTH) 435 MG CAPS Take by mouth.    . topiramate (TOPAMAX) 50 MG tablet   2  No current facility-administered medications for this visit.     PHYSICAL EXAMINATION:   BP 121/84 (BP Location: Left Arm, Patient Position: Sitting)   Pulse (!) 57   Temp (!) 96.5 F (35.8 C) (Tympanic)   Wt 199 lb 8 oz (90.5 kg)   BMI 31.72 kg/m   Filed Weights   10/14/16 1455  Weight: 199 lb 8 oz (90.5 kg)    GENERAL: Well-nourished well-developed; Alert, no distress and comfortable.   Alone. EYES: no pallor or icterus OROPHARYNX: no thrush or ulceration; good dentition  NECK: supple, no masses felt LYMPH:  no palpable lymphadenopathy in the cervical, axillary or inguinal regions LUNGS: clear to auscultation and  No wheeze or crackles HEART/CVS: regular rate &  rhythm and no murmurs; No lower extremity edema ABDOMEN:abdomen soft, non-tender and normal bowel sounds Musculoskeletal:no cyanosis of digits and no clubbing  PSYCH: alert & oriented x 3 with fluent speech NEURO: no focal motor/sensory deficits SKIN:  no rashes or significant lesions  LABORATORY DATA:  I have reviewed the data as listed    Component Value Date/Time   NA 140 03/20/2016 1010   NA 138 09/22/2012 1600   K 4.5 03/20/2016 1010   K 3.9 09/22/2012 1600   CL 97 03/20/2016 1010   CL 103 09/22/2012 1600   CO2 20 03/20/2016 1010   CO2 23 09/22/2012 1600   GLUCOSE 111 (H) 03/20/2016 1010   GLUCOSE 78 09/22/2012 1600   BUN 10 03/20/2016 1010   BUN 12 09/22/2012 1600   CREATININE 0.72 03/20/2016 1010   CREATININE 0.86 09/22/2012 1600   CALCIUM 9.8 03/20/2016 1010   CALCIUM 9.0 09/22/2012 1600   PROT 7.4 03/20/2016 1010   PROT 7.1 09/22/2012 1600   ALBUMIN 4.8 03/20/2016 1010   ALBUMIN 3.6 09/22/2012 1600   AST 15 03/20/2016 1010   AST 12 (L) 09/22/2012 1600   ALT 11 03/20/2016 1010   ALT 18 09/22/2012 1600   ALKPHOS 62 03/20/2016 1010   ALKPHOS 106 09/22/2012 1600   BILITOT 0.3 03/20/2016 1010   BILITOT 0.3 09/22/2012 1600   GFRNONAA 101 03/20/2016 1010   GFRNONAA >60 09/22/2012 1600   GFRAA 117 03/20/2016 1010   GFRAA >60 09/22/2012 1600    No results found for: SPEP, UPEP  Lab Results  Component Value Date   WBC 8.6 10/14/2016   NEUTROABS 6.1 10/14/2016   HGB 15.3 10/14/2016   HCT 44.9 10/14/2016   MCV 88.7 10/14/2016   PLT 307 10/14/2016      Chemistry      Component Value Date/Time   NA 140 03/20/2016 1010   NA 138 09/22/2012 1600   K 4.5 03/20/2016 1010   K 3.9 09/22/2012 1600   CL 97 03/20/2016 1010   CL 103 09/22/2012 1600   CO2 20 03/20/2016 1010   CO2 23 09/22/2012 1600   BUN 10 03/20/2016 1010   BUN 12 09/22/2012 1600   CREATININE 0.72 03/20/2016 1010   CREATININE 0.86 09/22/2012 1600      Component Value Date/Time   CALCIUM 9.8  03/20/2016 1010   CALCIUM 9.0 09/22/2012 1600   ALKPHOS 62 03/20/2016 1010   ALKPHOS 106 09/22/2012 1600   AST 15 03/20/2016 1010   AST 12 (L) 09/22/2012 1600   ALT 11 03/20/2016 1010   ALT 18 09/22/2012 1600   BILITOT 0.3 03/20/2016 1010   BILITOT 0.3 09/22/2012 1600        ASSESSMENT & PLAN:   Polycythemia vera ([place])  Erythrocytosis # 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 Jak 2 negative. Recommend phlebotomy as patient's symptoms of headaches elevated blood pressure fatigue improved post phlebotomy.   Recommend phlebotomy 400 mL every 6 months or so; to keep Hct < 44. Today- 44.9; proceed with phlebotomy today.   # hx of DVT [2007]/Heterozygous factor V leiden- Left Knee- stopped coumadin x6 months. No new blood clots. On asprin.    # B12 deficiency/positive intrinsic factor antibody- on sublingual.    # Patient follow-up with me in approximately 6 months/possible phlebotomy CBC.      Cammie Sickle, MD 10/14/2016 3:17 PM

## 2016-11-30 ENCOUNTER — Encounter: Payer: Self-pay | Admitting: Family Medicine

## 2016-11-30 ENCOUNTER — Ambulatory Visit (INDEPENDENT_AMBULATORY_CARE_PROVIDER_SITE_OTHER): Payer: BLUE CROSS/BLUE SHIELD | Admitting: Family Medicine

## 2016-11-30 VITALS — BP 128/82 | HR 66 | Temp 98.1°F | Resp 16 | Wt 201.4 lb

## 2016-11-30 DIAGNOSIS — B9789 Other viral agents as the cause of diseases classified elsewhere: Secondary | ICD-10-CM

## 2016-11-30 DIAGNOSIS — J069 Acute upper respiratory infection, unspecified: Secondary | ICD-10-CM | POA: Diagnosis not present

## 2016-11-30 MED ORDER — HYDROCODONE-HOMATROPINE 5-1.5 MG/5ML PO SYRP
ORAL_SOLUTION | ORAL | 0 refills | Status: DC
Start: 2016-11-30 — End: 2016-12-31

## 2016-11-30 NOTE — Progress Notes (Signed)
Subjective:     Patient ID: Michelle Simpson, female   DOB: 1971/08/01, 46 y.o.   MRN: [id number]  HPI  Chief Complaint  Patient presents with  . Cough    Patient comes in office today with complaints of dry cough and sinus congestion since 11/26/16. Patient states that sore throat began today, patient has been taking otc Benadryl and Severe Cold/Sinus tea.    States cough is keeping her up at night. + flu shot   Review of Systems     Objective:   Physical Exam  Constitutional: She appears well-developed and well-nourished. No distress.  Ears: T.M's intact without inflammation Throat: no tonsillar enlargement or exudate Neck: no cervical adenopathy Lungs: clear     Assessment:    1. Viral upper respiratory tract infection - HYDROcodone-homatropine (HYCODAN) 5-1.5 MG/5ML syrup; 5 ml 4-6 hours as needed for cough  Dispense: 240 mL; Refill: 0    Plan:    Discussed use of Mucinex D and Delsym. To call over the next few days if sinuses not improving.

## 2016-11-30 NOTE — Patient Instructions (Signed)
Discussed use of Mucinex D for congestion and Delsym for cough. 

## 2016-12-31 ENCOUNTER — Ambulatory Visit (INDEPENDENT_AMBULATORY_CARE_PROVIDER_SITE_OTHER): Payer: BLUE CROSS/BLUE SHIELD | Admitting: Family Medicine

## 2016-12-31 ENCOUNTER — Encounter: Payer: Self-pay | Admitting: Family Medicine

## 2016-12-31 ENCOUNTER — Other Ambulatory Visit: Payer: Self-pay | Admitting: Family Medicine

## 2016-12-31 VITALS — BP 132/88 | HR 60 | Temp 97.8°F | Resp 16 | Ht 67.5 in | Wt 197.0 lb

## 2016-12-31 DIAGNOSIS — I10 Essential (primary) hypertension: Secondary | ICD-10-CM | POA: Diagnosis not present

## 2016-12-31 DIAGNOSIS — D45 Polycythemia vera: Secondary | ICD-10-CM | POA: Diagnosis not present

## 2016-12-31 DIAGNOSIS — E782 Mixed hyperlipidemia: Secondary | ICD-10-CM | POA: Diagnosis not present

## 2016-12-31 DIAGNOSIS — Z1231 Encounter for screening mammogram for malignant neoplasm of breast: Secondary | ICD-10-CM | POA: Diagnosis not present

## 2016-12-31 DIAGNOSIS — G8929 Other chronic pain: Secondary | ICD-10-CM

## 2016-12-31 DIAGNOSIS — Z1239 Encounter for other screening for malignant neoplasm of breast: Secondary | ICD-10-CM

## 2016-12-31 DIAGNOSIS — M545 Low back pain: Secondary | ICD-10-CM

## 2016-12-31 NOTE — Progress Notes (Signed)
Subjective:     Patient ID: Michelle Simpson, female   DOB: 1971/10/17, 46 y.o.   MRN: [id number]  HPI  Chief Complaint  Patient presents with  . Annual Exam    Pt presents for an annual exam.  Pt is s/p hysterectomy. Flu shot was given 11/17/2016. Pt agrees to have Tdap today. Pt has never had a mammogram.  She wishes to get mammogram and update labs. Continues to be followed by [facility] pain center for chronic low back  who wish her evaluated for fibromyalgia. Sees [facility] for polycythemia. Have deferred Tdap today due to recent steroid injection and travel for vacation tomorrow.   Review of Systems General: Feeling well HEENT: Has upper dentures and does not see the dentist regularly. Does not use corrective lenses and has not needed an eye doctor. Cardiovascular: no chest pain, shortness of breath, or palpitations Respiratory: Has stopped smoking and currently vapes. GI: rare heartburn, no change in bowel habits or blood in the stool. Hx of IBS-D but is aware of dietary triggers. GU:  no change in bladder habits  Psychiatric: not depressed: states she deals primarily with high anxiety. Musculoskeletal: chronic low back pain and leg pain followed by [facility] pain center.    Objective:   Physical Exam  Constitutional: She appears well-developed and well-nourished. No distress.  Eyes: PERRLA, EOMI Neck: no thyromegaly, tenderness or nodules, no cervical adenopathy ENT: TM's intact without inflammation; No tonsillar enlargement or exudate, Lungs: Clear Breasts: no axillary adenopathy, no mass or nipple discharge; nipple rings present Heart : RRR without murmur or gallop Abd: bowel sounds present, soft, non-tender, no organomegaly Extremities: no edema      Assessment:    1. Polycythemia vera ([place]): per oncology  2. Mixed hyperlipidemia - Lipid panel  3. Benign essential HTN: controlled - Comprehensive metabolic panel  4. Screening for breast cancer - MM DIGITAL SCREENING  BILATERAL; Future  5. Chronic bilateral low back pain, with sciatica presence unspecified: per pain center - Ambulatory referral to Rheumatology    Plan:    Will get rheumatology opinion regarding fibromyalgia. Further f/u pending lab work.

## 2016-12-31 NOTE — Patient Instructions (Signed)
We will call you with the lab results when available. Do schedule a mammogram. We will give you the tetanus shot at a later date. We will you with the referral information.

## 2017-01-12 ENCOUNTER — Telehealth: Payer: Self-pay | Admitting: Family Medicine

## 2017-01-12 NOTE — Telephone Encounter (Signed)
Pt advised.

## 2017-01-12 NOTE — Telephone Encounter (Signed)
FYI--I have not been able to find a rheumatologist that will treat fibromyalgia or make the diagnosis.The offices I have reached out to will not accept pt or suggest that pt be treated by pain clinic.Pt is already being treated by pain clinic

## 2017-01-12 NOTE — Telephone Encounter (Signed)
She may have to stick with the [facility] system she is in now for her pain clinic. They must have a clinic/specialist treating this. I do not treat this myself.

## 2017-01-18 ENCOUNTER — Ambulatory Visit
Admission: RE | Admit: 2017-01-18 | Discharge: 2017-01-18 | Disposition: A | Discharge status: Home / Self Care | Payer: BLUE CROSS/BLUE SHIELD | Source: Ambulatory Visit | Attending: Family Medicine | Admitting: Family Medicine

## 2017-01-18 DIAGNOSIS — Z1231 Encounter for screening mammogram for malignant neoplasm of breast: Secondary | ICD-10-CM | POA: Diagnosis present

## 2017-01-18 DIAGNOSIS — Z1239 Encounter for other screening for malignant neoplasm of breast: Secondary | ICD-10-CM

## 2017-04-14 ENCOUNTER — Inpatient Hospital Stay: Payer: BLUE CROSS/BLUE SHIELD

## 2017-04-14 ENCOUNTER — Inpatient Hospital Stay: Payer: BLUE CROSS/BLUE SHIELD | Attending: Internal Medicine | Admitting: Internal Medicine

## 2017-04-14 VITALS — BP 138/83 | HR 59 | Temp 98.0°F | Wt 195.0 lb

## 2017-04-14 DIAGNOSIS — F410 Panic disorder [episodic paroxysmal anxiety] without agoraphobia: Secondary | ICD-10-CM | POA: Diagnosis not present

## 2017-04-14 DIAGNOSIS — Z7982 Long term (current) use of aspirin: Secondary | ICD-10-CM | POA: Insufficient documentation

## 2017-04-14 DIAGNOSIS — R609 Edema, unspecified: Secondary | ICD-10-CM | POA: Insufficient documentation

## 2017-04-14 DIAGNOSIS — D751 Secondary polycythemia: Secondary | ICD-10-CM | POA: Diagnosis not present

## 2017-04-14 DIAGNOSIS — G629 Polyneuropathy, unspecified: Secondary | ICD-10-CM | POA: Insufficient documentation

## 2017-04-14 DIAGNOSIS — E538 Deficiency of other specified B group vitamins: Secondary | ICD-10-CM | POA: Insufficient documentation

## 2017-04-14 DIAGNOSIS — M5136 Other intervertebral disc degeneration, lumbar region: Secondary | ICD-10-CM | POA: Insufficient documentation

## 2017-04-14 DIAGNOSIS — D6851 Activated protein C resistance: Secondary | ICD-10-CM | POA: Diagnosis not present

## 2017-04-14 DIAGNOSIS — Z87891 Personal history of nicotine dependence: Secondary | ICD-10-CM | POA: Insufficient documentation

## 2017-04-14 DIAGNOSIS — Z86718 Personal history of other venous thrombosis and embolism: Secondary | ICD-10-CM | POA: Diagnosis not present

## 2017-04-14 DIAGNOSIS — I10 Essential (primary) hypertension: Secondary | ICD-10-CM | POA: Diagnosis not present

## 2017-04-14 DIAGNOSIS — K589 Irritable bowel syndrome without diarrhea: Secondary | ICD-10-CM | POA: Insufficient documentation

## 2017-04-14 DIAGNOSIS — F329 Major depressive disorder, single episode, unspecified: Secondary | ICD-10-CM | POA: Diagnosis not present

## 2017-04-14 DIAGNOSIS — D45 Polycythemia vera: Secondary | ICD-10-CM

## 2017-04-14 DIAGNOSIS — Z79899 Other long term (current) drug therapy: Secondary | ICD-10-CM | POA: Insufficient documentation

## 2017-04-14 LAB — CBC WITH DIFFERENTIAL/PLATELET
Basophils Absolute: 0.1 10*3/uL (ref 0–0.1)
Basophils Relative: 1 %
Eosinophils Absolute: 0.2 10*3/uL (ref 0–0.7)
Eosinophils Relative: 2 %
HEMATOCRIT: 42.6 % (ref 35.0–47.0)
HEMOGLOBIN: 14.9 g/dL (ref 12.0–16.0)
LYMPHS ABS: 2.1 10*3/uL (ref 1.0–3.6)
LYMPHS PCT: 23 %
MCH: 30.6 pg (ref 26.0–34.0)
MCHC: 34.9 g/dL (ref 32.0–36.0)
MCV: 87.7 fL (ref 80.0–100.0)
Monocytes Absolute: 0.5 10*3/uL (ref 0.2–0.9)
Monocytes Relative: 6 %
NEUTROS ABS: 6.3 10*3/uL (ref 1.4–6.5)
Neutrophils Relative %: 68 %
Platelets: 304 10*3/uL (ref 150–440)
RBC: 4.86 MIL/uL (ref 3.80–5.20)
RDW: 14.4 % (ref 11.5–14.5)
WBC: 9.2 10*3/uL (ref 3.6–11.0)

## 2017-04-14 NOTE — Progress Notes (Signed)
Patient here today for follow up.   

## 2017-04-14 NOTE — Assessment & Plan Note (Addendum)
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 Recommend phlebotomy as patient's symptoms of headaches elevated blood pressure fatigue improved post phlebotomy.   Recommend phlebotomy 400 mL every 6 months or so; to keep Hct < 44. Today- 42; no phlebotomy today.  # hx of DVT [2007]/Heterozygous factor V leiden- Left Knee- stopped coumadin x6 months. No new blood clots. On asprin.   # B12 deficiency/positive intrinsic factor antibody- on sublingual.   # Patient follow-up with me in approximately 6 months/possible phlebotomy CBC.

## 2017-04-14 NOTE — Progress Notes (Signed)
[place] OFFICE PROGRESS NOTE  Patient Care Team: Carmon Ginsberg, [place] as PCP - General (Family Medicine)   SUMMARY OF HEMATOLOGIC/ONCOLOGIC HISTORY:  # 2013-ERYTHROCYTOSIS- likely secondary - Normal Epo/ Jak-2 NEG; symptomatic  # 2007- LE DVT- coumadin x 6 M; Heterozygous Factor V leiden-   # B12 def/ intrinsic factor Ab/ Peripheral neuropathy- sub ling B12  INTERVAL HISTORY:  A pleasant 46 year old female patient with above history of isolated erythrocytosis is here for follow-up.   Patient's symptoms improved post phlebotomy. Denies any new blood clots. Patient states that multiple family members- diagnosed with blood clots/malignancies. She currently denies any headaches. Denies any significant fatigue.   REVIEW OF SYSTEMS:  A complete 10 point review of system is done which is negative except mentioned above/history of present illness.   PAST MEDICAL HISTORY :  Past Medical History:  Diagnosis Date  . B12 deficiency   . Degenerative disc disease, lumbar   . Depression   . DJD (degenerative joint disease)   . Eczema   . Edema   . Endometriosis   . High mean corpuscular hemoglobin concentration (MCHC)   . History of DVT (deep vein thrombosis)   . Hypertension   . IBS (irritable bowel syndrome)   . Osteoarthritis of right hip   . Panic attack     PAST SURGICAL HISTORY :   Past Surgical History:  Procedure Laterality Date  . ABDOMINAL HYSTERECTOMY    . ANKLE SURGERY Right   . ANKLE SURGERY Right    plate placed  . Bilateral knee RFA nerve ablation @ [facility]    . CHOLECYSTECTOMY    . KNEE SURGERY Left 2012  . LACRIMAL DUCT RECONSTRUCTION    . LAPAROSCOPY    . miscarriage D&C  1995  . TYMPANOSTOMY TUBE PLACEMENT  1977    FAMILY HISTORY :   Family History  Problem Relation Age of Onset  . Hypertension Father   . Hypertension Mother   . Deep vein thrombosis Mother   . Narcolepsy Mother   . Hypertension Maternal Grandmother   .  Hypertension Paternal Grandmother   . Hypertension Brother   . Heart attack Brother   . Liver disease Brother     SOCIAL HISTORY:   Social History  Substance Use Topics  . Smoking status: Former Smoker    Packs/day: 1.00    Years: 20.00    Types: Cigarettes    Quit date: 11/17/2015  . Smokeless tobacco: Never Used     Comment: Currently 'vapes' non-nicotine product  . Alcohol use 0.0 oz/week     Comment: 'ocassional glass of wine"    ALLERGIES:  is allergic to atenolol; gabapentin; nortriptyline; amlodipine; ibuprofen; latex; meloxicam; metoprolol; nsaids; and paxil cr [paroxetine hcl er].  MEDICATIONS:  Current Outpatient Prescriptions  Medication Sig Dispense Refill  . acetaminophen (TYLENOL) 500 MG tablet Take by mouth.    . Alpha-Lipoic Acid 600 MG CAPS Take 600 mg by mouth.    Marland Kitchen aspirin 81 MG tablet Take by mouth.    . bisoprolol (ZEBETA) 5 MG tablet TAKE 1 TABLET(5 MG) BY MOUTH DAILY 90 tablet 3  . Cyanocobalamin (B-12) 3000 MCG SUBL Place under the tongue.    . Omega-3 Fatty Acids (FISH OIL EXTRA STRENGTH) 435 MG CAPS Take by mouth.    . topiramate (TOPAMAX) 50 MG tablet Take 50 mg by mouth daily.   2   No current facility-administered medications for this visit.     PHYSICAL EXAMINATION:  BP 138/83 (BP Location: Left Arm, Patient Position: Sitting)   Pulse (!) 59   Temp 98 F (36.7 C) (Tympanic)   Wt 195 lb (88.5 kg)   BMI 30.09 kg/m   Filed Weights   04/14/17 1401  Weight: 195 lb (88.5 kg)    GENERAL: Well-nourished well-developed; Alert, no distress and comfortable.   Alone. EYES: no pallor or icterus OROPHARYNX: no thrush or ulceration; good dentition  NECK: supple, no masses felt LYMPH:  no palpable lymphadenopathy in the cervical, axillary or inguinal regions LUNGS: clear to auscultation and  No wheeze or crackles HEART/CVS: regular rate & rhythm and no murmurs; No lower extremity edema ABDOMEN:abdomen soft, non-tender and normal bowel  sounds Musculoskeletal:no cyanosis of digits and no clubbing  PSYCH: alert & oriented x 3 with fluent speech NEURO: no focal motor/sensory deficits SKIN:  no rashes or significant lesions  LABORATORY DATA:  I have reviewed the data as listed    Component Value Date/Time   NA 140 03/20/2016 1010   NA 138 09/22/2012 1600   K 4.5 03/20/2016 1010   K 3.9 09/22/2012 1600   CL 97 03/20/2016 1010   CL 103 09/22/2012 1600   CO2 20 03/20/2016 1010   CO2 23 09/22/2012 1600   GLUCOSE 111 (H) 03/20/2016 1010   GLUCOSE 78 09/22/2012 1600   BUN 10 03/20/2016 1010   BUN 12 09/22/2012 1600   CREATININE 0.72 03/20/2016 1010   CREATININE 0.86 09/22/2012 1600   CALCIUM 9.8 03/20/2016 1010   CALCIUM 9.0 09/22/2012 1600   PROT 7.4 03/20/2016 1010   PROT 7.1 09/22/2012 1600   ALBUMIN 4.8 03/20/2016 1010   ALBUMIN 3.6 09/22/2012 1600   AST 15 03/20/2016 1010   AST 12 (L) 09/22/2012 1600   ALT 11 03/20/2016 1010   ALT 18 09/22/2012 1600   ALKPHOS 62 03/20/2016 1010   ALKPHOS 106 09/22/2012 1600   BILITOT 0.3 03/20/2016 1010   BILITOT 0.3 09/22/2012 1600   GFRNONAA 101 03/20/2016 1010   GFRNONAA >60 09/22/2012 1600   GFRAA 117 03/20/2016 1010   GFRAA >60 09/22/2012 1600    No results found for: SPEP, UPEP  Lab Results  Component Value Date   WBC 9.2 04/14/2017   NEUTROABS 6.3 04/14/2017   HGB 14.9 04/14/2017   HCT 42.6 04/14/2017   MCV 87.7 04/14/2017   PLT 304 04/14/2017      Chemistry      Component Value Date/Time   NA 140 03/20/2016 1010   NA 138 09/22/2012 1600   K 4.5 03/20/2016 1010   K 3.9 09/22/2012 1600   CL 97 03/20/2016 1010   CL 103 09/22/2012 1600   CO2 20 03/20/2016 1010   CO2 23 09/22/2012 1600   BUN 10 03/20/2016 1010   BUN 12 09/22/2012 1600   CREATININE 0.72 03/20/2016 1010   CREATININE 0.86 09/22/2012 1600      Component Value Date/Time   CALCIUM 9.8 03/20/2016 1010   CALCIUM 9.0 09/22/2012 1600   ALKPHOS 62 03/20/2016 1010   ALKPHOS 106  09/22/2012 1600   AST 15 03/20/2016 1010   AST 12 (L) 09/22/2012 1600   ALT 11 03/20/2016 1010   ALT 18 09/22/2012 1600   BILITOT 0.3 03/20/2016 1010   BILITOT 0.3 09/22/2012 1600        ASSESSMENT & PLAN:   Erythrocytosis # 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 Jak 2 negative. Recommend phlebotomy as patient's symptoms of headaches elevated blood pressure fatigue improved post phlebotomy.  Recommend phlebotomy 400 mL every 6 months or so; to keep Hct < 44. Today- 42; no phlebotomy today.  # hx of DVT [2007]/Heterozygous factor V leiden- Left Knee- stopped coumadin x6 months. No new blood clots. On asprin.   # B12 deficiency/positive intrinsic factor antibody- on sublingual.   # Patient follow-up with me in approximately 6 months/possible phlebotomy CBC.      Cammie Sickle, MD 04/15/2017 9:43 PM

## 2017-04-21 ENCOUNTER — Encounter: Payer: Self-pay | Admitting: Physician Assistant

## 2017-04-21 ENCOUNTER — Ambulatory Visit (INDEPENDENT_AMBULATORY_CARE_PROVIDER_SITE_OTHER): Payer: BLUE CROSS/BLUE SHIELD | Admitting: Physician Assistant

## 2017-04-21 ENCOUNTER — Telehealth: Payer: Self-pay

## 2017-04-21 DIAGNOSIS — I10 Essential (primary) hypertension: Secondary | ICD-10-CM
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
## 2017-04-22 ENCOUNTER — Telehealth: Payer: Self-pay | Admitting: Physician Assistant

## 2017-04-22 NOTE — Telephone Encounter (Signed)
Hospital contacted [place] that they could not find record of Tdap. If patient would like her Tdap updated, she may come in for nurse visit to get the shot. Thank you.

## 2017-06-02 ENCOUNTER — Telehealth: Payer: Self-pay | Admitting: Family Medicine

## 2017-06-02 ENCOUNTER — Telehealth: Payer: Self-pay | Admitting: Physician Assistant

## 2017-06-02 NOTE — Telephone Encounter (Signed)
OPEN IN ERROR 

## 2017-06-02 NOTE — Telephone Encounter (Signed)
ROI faxed to [facility] records  04-21-17  Received fax back that [facility] did NOT have what was asked for.(Dr. Carles Collet)

## 2017-08-21 ENCOUNTER — Ambulatory Visit (INDEPENDENT_AMBULATORY_CARE_PROVIDER_SITE_OTHER): Payer: BLUE CROSS/BLUE SHIELD | Admitting: Physician Assistant

## 2017-08-21 VITALS — BP 148/98 | HR 66 | Temp 98.0°F | Resp 18 | Wt 191.2 lb

## 2017-08-21 DIAGNOSIS — J40 Bronchitis, not specified as acute or chronic: Secondary | ICD-10-CM

## 2017-08-21 DIAGNOSIS — R509 Fever, unspecified: Secondary | ICD-10-CM | POA: Diagnosis not present

## 2017-08-21 DIAGNOSIS — I10 Essential (primary) hypertension: Secondary | ICD-10-CM | POA: Diagnosis not present

## 2017-08-21 DIAGNOSIS — R059 Cough, unspecified: Secondary | ICD-10-CM

## 2017-08-21 DIAGNOSIS — R05 Cough: Secondary | ICD-10-CM

## 2017-08-21 DIAGNOSIS — R0981 Nasal congestion: Secondary | ICD-10-CM | POA: Diagnosis not present

## 2017-08-21 LAB — POCT INFLUENZA A/B
Influenza A, POC: NEGATIVE
Influenza B, POC: NEGATIVE

## 2017-08-21 MED ORDER — HYDROCODONE-HOMATROPINE 5-1.5 MG/5ML PO SYRP
5.0000 mL | ORAL_SOLUTION | Freq: Every evening | ORAL | 0 refills | Status: DC | PRN
Start: 2017-08-21 — End: 2017-10-20

## 2017-08-21 MED ORDER — DOXYCYCLINE HYCLATE 100 MG PO TABS
100.0000 mg | ORAL_TABLET | Freq: Two times a day (BID) | ORAL | 0 refills | Status: AC
Start: 2017-08-21 — End: 2017-08-28

## 2017-08-21 NOTE — Progress Notes (Signed)
Michelle Simpson  MRN: [id number] DOB: Apr 02, 1971  Subjective:  HPI  Michelle Simpson is a 46 y/o woman, former smoker 20 pack years who presents today with fever, cough, and congestion. Patient states she got sick this Tuesday 08/17/17. Symptoms are chest congestion, dry hacking cough, fever of 101 almost daily, sweats and chills, body aches-better now, nasal congestion and drainage, hoarse, slight wheezing. Patient has taking OTC Delsym, nasal spray and cold and flu medication. She feels slightly better, mostly her cough is bothering her. She has not had her flu shot this year.   Patient Active Problem List   Diagnosis Date Noted  . Osteoarthritis of right knee 11/03/2016  . Erythrocytosis 10/14/2016  . Spondylosis of lumbar region without myelopathy or radiculopathy 07/15/2016  . Irritable bowel syndrome 04/29/2016  . Polycythemia vera ([place]) 03/30/2016  . Chronic pelvic pain in female 03/20/2016  . Allergic rhinitis 03/20/2016  . Hyperlipidemia 03/20/2016  . Osteoarthritis of right hip 03/20/2016  . Heterozygous factor V Leiden mutation ([place]) 03/20/2016  . Personal history of DVT (deep vein thrombosis) 03/20/2016  . Primary osteoarthritis of both knees 10/23/2014  . DDD (degenerative disc disease), lumbar 10/04/2014  . Neuritis or radiculitis due to rupture of lumbar intervertebral disc 09/12/2014  . Benign essential HTN 09/05/2014  . Edema of lower extremity 09/05/2014  . Trochanteric bursitis of right hip 06/19/2014  . Endometriosis 05/06/2010    Past Medical History:  Diagnosis Date  . B12 deficiency   . Degenerative disc disease, lumbar   . Depression   . DJD (degenerative joint disease)   . Eczema   . Edema   . Endometriosis   . High mean corpuscular hemoglobin concentration (MCHC)   . History of DVT (deep vein thrombosis)   . Hypertension   . IBS (irritable bowel syndrome)   . Osteoarthritis of right hip   . Panic attack     Social History   Social History  .  Marital status: Single    Spouse name: N/A  . Number of children: N/A  . Years of education: N/A   Occupational History  . Not on file.   Social History Main Topics  . Smoking status: Former Smoker    Packs/day: 1.00    Years: 20.00    Types: Cigarettes    Quit date: 11/17/2015  . Smokeless tobacco: Never Used     Comment: Currently 'vapes' non-nicotine product  . Alcohol use 0.0 oz/week     Comment: 'ocassional glass of wine"  . Drug use: No  . Sexual activity: No   Other Topics Concern  . Not on file   Social History Narrative  . No narrative on file    Outpatient Encounter Prescriptions as of 08/21/2017  Medication Sig Note  . acetaminophen (TYLENOL) 500 MG tablet Take by mouth. 03/18/2016: Received from: [facility]  . Alpha-Lipoic Acid 600 MG CAPS Take 600 mg by mouth. 08/18/2016: Received from: [facility]: Take 1 capsule (600 mg total) by mouth Two (2) times a day.  Marland Kitchen aspirin 81 MG tablet Take by mouth. 03/18/2016: Received from: [facility]  . bisoprolol (ZEBETA) 5 MG tablet TAKE 1 TABLET(5 MG) BY MOUTH DAILY   . Cyanocobalamin (B-12) 3000 MCG SUBL Place under the tongue. 10/14/2016: Received from: [facility]: Place under the tongue.  . Omega-3 Fatty Acids (FISH OIL EXTRA STRENGTH) 435 MG CAPS Take by mouth. 03/18/2016: Received from: [facility]  . [DISCONTINUED] topiramate (TOPAMAX) 50 MG tablet Take 50 mg by mouth daily.  08/18/2016: Received from: External Pharmacy   No facility-administered encounter medications on file as of 08/21/2017.     Allergies  Allergen Reactions  . Atenolol Hives    Urticaria.  . Gabapentin Swelling  . Nortriptyline Swelling  . Amlodipine Swelling  . Ibuprofen Other (See Comments)    With prolonged use swelling, HTN and congestion.  . Latex Hives  . Meloxicam     Other reaction(s): Other (See Comments) Upset stomach.  . Metoprolol Hives  . Nsaids  Swelling  . Paxil Cr [Paroxetine Hcl Er] Other (See Comments)    hallucination    Review of Systems  Constitutional: Positive for chills, fever and malaise/fatigue.  HENT: Positive for congestion and sore throat.   Respiratory: Positive for cough and wheezing. Negative for sputum production and shortness of breath.   Cardiovascular: Negative.   Gastrointestinal: Negative.   Musculoskeletal: Positive for joint pain and myalgias.    Objective:  BP (!) 148/98   Pulse 66   Temp 98 F (36.7 C)   Resp 18   Wt 191 lb 3.2 oz (86.7 kg)   SpO2 96%   BMI 29.50 kg/m   Physical Exam  Constitutional: She is oriented to person, place, and time and well-developed, well-nourished, and in no distress. She has a sickly appearance. No distress.  HENT:  Right Ear: Tympanic membrane and external ear normal.  Left Ear: Tympanic membrane and external ear normal.  Eyes: Right conjunctiva is injected. Left conjunctiva is injected.  Neck: Neck supple.  Bilateral submandibular lymphadenopathy.  Cardiovascular: Normal rate and regular rhythm.   Pulmonary/Chest: Effort normal. She has wheezes. She has no rales.  Scant occasional wheezing.  Neurological: She is alert and oriented to person, place, and time.  Skin: Skin is warm and dry. She is not diaphoretic.  Psychiatric: Affect and judgment normal.  Vitals reviewed.   Assessment and Plan :  1. Bronchitis  Patient has albuterol inhaler, can use 2 puffs Q6H PRN. Treat symptomatically. Call back if worsening. Sent doxycycline into pharmacy only to be taken if she is severely worsening or develops sinus infection.  2. Cough  Counseled on sedation.  - HYDROcodone-homatropine (HYCODAN) 5-1.5 MG/5ML syrup; Take 5 mLs by mouth at bedtime as needed.  Dispense: 120 mL; Refill: 0 - POCT Influenza A/B  3. Nose congestion   4. Fever, unspecified fever cause  Flu negative. Encourage flu shot when feeling better. - POCT Influenza A/B  5. Benign  essential HTN  Elevated today, likely 2/2 cold and flu medication. Please do not use decongestants.  Return if symptoms worsen or fail to improve.  The entirety of the information documented in the History of Present Illness, Review of Systems and Physical Exam were personally obtained by me. Portions of this information were initially documented by Ashley Royalty, CMA and reviewed by me for thoroughness and accuracy.

## 2017-08-21 NOTE — Patient Instructions (Signed)
Upper Respiratory Infection, Adult Most upper respiratory infections (URIs) are caused by a virus. A URI affects the nose, throat, and upper air passages. The most common type of URI is often called "the common cold." Follow these instructions at home:  Take medicines only as told by your doctor.  Gargle warm saltwater or take cough drops to comfort your throat as told by your doctor.  Use a warm mist humidifier or inhale steam from a shower to increase air moisture. This may make it easier to breathe.  Drink enough fluid to keep your pee (urine) clear or pale yellow.  Eat soups and other clear broths.  Have a healthy diet.  Rest as needed.  Go back to work when your fever is gone or your doctor says it is okay. ? You may need to stay home longer to avoid giving your URI to others. ? You can also wear a face mask and wash your hands often to prevent spread of the virus.  Use your inhaler more if you have asthma.  Do not use any tobacco products, including cigarettes, chewing tobacco, or electronic cigarettes. If you need help quitting, ask your doctor. Contact a doctor if:  You are getting worse, not better.  Your symptoms are not helped by medicine.  You have chills.  You are getting more short of breath.  You have brown or red mucus.  You have yellow or brown discharge from your nose.  You have pain in your face, especially when you bend forward.  You have a fever.  You have puffy (swollen) neck glands.  You have pain while swallowing.  You have white areas in the back of your throat. Get help right away if:  You have very bad or constant: ? Headache. ? Ear pain. ? Pain in your forehead, behind your eyes, and over your cheekbones (sinus pain). ? Chest pain.  You have long-lasting (chronic) lung disease and any of the following: ? Wheezing. ? Long-lasting cough. ? Coughing up blood. ? A change in your usual mucus.  You have a stiff neck.  You have  changes in your: ? Vision. ? Hearing. ? Thinking. ? Mood. This information is not intended to replace advice given to you by your health care provider. Make sure you discuss any questions you have with your health care provider. Document Released: 04/20/2008 Document Revised: 07/05/2016 Document Reviewed: 02/07/2014 Elsevier Interactive Patient Education  2018 Elsevier Inc.  

## 2017-09-23 ENCOUNTER — Telehealth: Payer: Self-pay | Admitting: Family Medicine

## 2017-09-23 ENCOUNTER — Other Ambulatory Visit: Payer: Self-pay | Admitting: Family Medicine

## 2017-09-23 DIAGNOSIS — I10 Essential (primary) hypertension: Secondary | ICD-10-CM

## 2017-09-23 MED ORDER — BISOPROLOL FUMARATE 5 MG PO TABS: ORAL_TABLET | ORAL | 3 refills | Status: DC

## 2017-09-23 NOTE — Telephone Encounter (Signed)
Pt contacted office for refill request on the following medications:  bisoprolol (ZEBETA) 5 MG tablet  Meriel Pica  [phone]

## 2017-09-23 NOTE — Telephone Encounter (Signed)
done

## 2017-09-23 NOTE — Telephone Encounter (Signed)
Last filled 09/10/16. Please advise. KW

## 2017-10-20 ENCOUNTER — Inpatient Hospital Stay (HOSPITAL_BASED_OUTPATIENT_CLINIC_OR_DEPARTMENT_OTHER): Payer: BLUE CROSS/BLUE SHIELD | Admitting: Internal Medicine

## 2017-10-20 ENCOUNTER — Inpatient Hospital Stay: Payer: BLUE CROSS/BLUE SHIELD

## 2017-10-20 ENCOUNTER — Inpatient Hospital Stay: Payer: BLUE CROSS/BLUE SHIELD | Attending: Internal Medicine | Admitting: *Deleted

## 2017-10-20 ENCOUNTER — Encounter: Payer: Self-pay | Admitting: Internal Medicine

## 2017-10-20 ENCOUNTER — Other Ambulatory Visit: Payer: Self-pay

## 2017-10-20 VITALS — BP 163/104 | HR 56 | Temp 97.8°F | Resp 20

## 2017-10-20 DIAGNOSIS — M5136 Other intervertebral disc degeneration, lumbar region: Secondary | ICD-10-CM | POA: Diagnosis not present

## 2017-10-20 DIAGNOSIS — Z86718 Personal history of other venous thrombosis and embolism: Secondary | ICD-10-CM | POA: Insufficient documentation

## 2017-10-20 DIAGNOSIS — E538 Deficiency of other specified B group vitamins: Secondary | ICD-10-CM | POA: Insufficient documentation

## 2017-10-20 DIAGNOSIS — D6851 Activated protein C resistance: Secondary | ICD-10-CM | POA: Insufficient documentation

## 2017-10-20 DIAGNOSIS — Z87891 Personal history of nicotine dependence: Secondary | ICD-10-CM | POA: Diagnosis not present

## 2017-10-20 DIAGNOSIS — G629 Polyneuropathy, unspecified: Secondary | ICD-10-CM | POA: Diagnosis not present

## 2017-10-20 DIAGNOSIS — I10 Essential (primary) hypertension: Secondary | ICD-10-CM | POA: Diagnosis not present

## 2017-10-20 DIAGNOSIS — D751 Secondary polycythemia: Secondary | ICD-10-CM

## 2017-10-20 DIAGNOSIS — M1990 Unspecified osteoarthritis, unspecified site: Secondary | ICD-10-CM

## 2017-10-20 DIAGNOSIS — K589 Irritable bowel syndrome without diarrhea: Secondary | ICD-10-CM | POA: Insufficient documentation

## 2017-10-20 DIAGNOSIS — F329 Major depressive disorder, single episode, unspecified: Secondary | ICD-10-CM | POA: Insufficient documentation

## 2017-10-20 DIAGNOSIS — D45 Polycythemia vera: Secondary | ICD-10-CM

## 2017-10-20 LAB — CBC WITH DIFFERENTIAL/PLATELET
BASOS ABS: 0.1 10*3/uL (ref 0–0.1)
Basophils Relative: 0 %
Eosinophils Absolute: 0.2 10*3/uL (ref 0–0.7)
Eosinophils Relative: 1 %
HEMATOCRIT: 46.9 % (ref 35.0–47.0)
Hemoglobin: 15.7 g/dL (ref 12.0–16.0)
Lymphocytes Relative: 13 %
Lymphs Abs: 1.9 10*3/uL (ref 1.0–3.6)
MCH: 30.4 pg (ref 26.0–34.0)
MCHC: 33.4 g/dL (ref 32.0–36.0)
MCV: 91 fL (ref 80.0–100.0)
MONO ABS: 0.7 10*3/uL (ref 0.2–0.9)
Monocytes Relative: 5 %
Neutro Abs: 11.9 10*3/uL — ABNORMAL HIGH (ref 1.4–6.5)
Neutrophils Relative %: 81 %
Platelets: 301 10*3/uL (ref 150–440)
RBC: 5.15 MIL/uL (ref 3.80–5.20)
RDW: 14.2 % (ref 11.5–14.5)
WBC: 14.8 10*3/uL — ABNORMAL HIGH (ref 3.6–11.0)

## 2017-10-20 LAB — BASIC METABOLIC PANEL
Anion gap: 9 (ref 5–15)
BUN: 8 mg/dL (ref 6–20)
CO2: 23 mmol/L (ref 22–32)
Calcium: 8.5 mg/dL — ABNORMAL LOW (ref 8.9–10.3)
Chloride: 103 mmol/L (ref 101–111)
Creatinine, Ser: 0.67 mg/dL (ref 0.44–1.00)
GFR calc Af Amer: >60 mL/min (ref >60)
GLUCOSE: 136 mg/dL — AB (ref 65–99)
Potassium: 3.6 mmol/L (ref 3.5–5.1)
Sodium: 135 mmol/L (ref 135–145)

## 2017-10-20 NOTE — Assessment & Plan Note (Signed)
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 Recommend phlebotomy as patient's symptoms of headaches elevated blood pressure fatigue improved post phlebotomy.   Recommend phlebotomy 400 mL every 6 months or so; to keep Hct < 42 Today- 46; proceed with phlebotomy today.  # hx of DVT [2007]/Heterozygous factor V leiden- Left Knee- stopped coumadin x6 months. No new blood clots. On asprin.   # Blood pressure- 163/104; at home 140s/90s. Defer to PCP.  # B12 deficiency/positive intrinsic factor antibody- on sublingual.   # Patient follow-up with me in approximately 6 months/possible phlebotomy CBC. Also discussed re: red- cross.

## 2017-10-20 NOTE — Progress Notes (Signed)
Pt here for phlebotomy today. Very difficult IV stick, unable to get 420ml, only able to get 149ml after 6 IV sticks by three RNs. Dr Rogue Bussing aware and patient discharged home. Patient to keep her follow up appointment in 6 mos. Pt stated she may try the [employer]

## 2017-10-20 NOTE — Progress Notes (Signed)
[facility] OFFICE PROGRESS NOTE  Patient Care Team: Carmon Ginsberg, [place] as PCP - General (Family Medicine)   SUMMARY OF HEMATOLOGIC/ONCOLOGIC HISTORY:  # 2013-ERYTHROCYTOSIS- likely secondary - Normal Epo/ Jak-2 NEG; symptomatic  # 2007- LE DVT- coumadin x 6 M; Heterozygous Factor V leiden-   # B12 def/ intrinsic factor Ab/ Peripheral neuropathy- sub ling B12  INTERVAL HISTORY:  A pleasant 46 year old female patient with above history of isolated erythrocytosis is here for follow-up.  Patient notes to have intermittent headaches over the last few weeks. Patient's symptoms improved post phlebotomy. Denies any new blood clots.Denies any significant fatigue.   REVIEW OF SYSTEMS:  A complete 10 point review of system is done which is negative except mentioned above/history of present illness.   PAST MEDICAL HISTORY :  Past Medical History:  Diagnosis Date  . B12 deficiency   . Degenerative disc disease, lumbar   . Depression   . DJD (degenerative joint disease)   . Eczema   . Edema   . Endometriosis   . High mean corpuscular hemoglobin concentration (MCHC)   . History of DVT (deep vein thrombosis)   . Hypertension   . IBS (irritable bowel syndrome)   . Osteoarthritis of right hip   . Panic attack     PAST SURGICAL HISTORY :   Past Surgical History:  Procedure Laterality Date  . ABDOMINAL HYSTERECTOMY    . ANKLE SURGERY Right   . ANKLE SURGERY Right    plate placed  . Bilateral knee RFA nerve ablation @ [facility] hospitals    . CHOLECYSTECTOMY    . KNEE SURGERY Left 2012  . LACRIMAL DUCT RECONSTRUCTION    . LAPAROSCOPY    . miscarriage D&C  1995  . TYMPANOSTOMY TUBE PLACEMENT  1977    FAMILY HISTORY :   Family History  Problem Relation Age of Onset  . Hypertension Father   . Hypertension Mother   . Deep vein thrombosis Mother   . Narcolepsy Mother   . Hypertension Maternal Grandmother   . Hypertension Paternal Grandmother   . Hypertension Brother    . Heart attack Brother   . Liver disease Brother     SOCIAL HISTORY:   Social History   Tobacco Use  . Smoking status: Former Smoker    Packs/day: 1.00    Years: 20.00    Pack years: 20.00    Types: Cigarettes    Last attempt to quit: 11/17/2015    Years since quitting: 1.9  . Smokeless tobacco: Never Used  . Tobacco comment: Currently 'vapes' non-nicotine product  Substance Use Topics  . Alcohol use: Yes    Alcohol/week: 0.0 oz    Comment: 'ocassional glass of wine"  . Drug use: No    ALLERGIES:  is allergic to atenolol; gabapentin; nortriptyline; amlodipine; ibuprofen; latex; meloxicam; metoprolol; nsaids; and paxil cr [paroxetine hcl er].  MEDICATIONS:  Current Outpatient Medications  Medication Sig Dispense Refill  . acetaminophen (TYLENOL 8 HOUR) 650 MG CR tablet Take 650 mg by mouth every 8 (eight) hours as needed for pain.    . Alpha-Lipoic Acid 600 MG CAPS Take 600 mg by mouth.    Marland Kitchen aspirin 81 MG tablet Take by mouth.    . bisoprolol (ZEBETA) 5 MG tablet TAKE 1 TABLET(5 MG) BY MOUTH DAILY 90 tablet 3  . Cyanocobalamin (B-12) 3000 MCG SUBL Place under the tongue.    . Omega-3 Fatty Acids (FISH OIL EXTRA STRENGTH) 435 MG CAPS Take by mouth.  No current facility-administered medications for this visit.     PHYSICAL EXAMINATION:   BP (!) 163/104   Pulse (!) 56   Temp 97.8 F (36.6 C) (Tympanic)   Resp 20   There were no vitals filed for this visit.  GENERAL: Well-nourished well-developed; Alert, no distress and comfortable.   Alone. EYES: no pallor or icterus OROPHARYNX: no thrush or ulceration; good dentition  NECK: supple, no masses felt LYMPH:  no palpable lymphadenopathy in the cervical, axillary or inguinal regions LUNGS: clear to auscultation and  No wheeze or crackles HEART/CVS: regular rate & rhythm and no murmurs; No lower extremity edema ABDOMEN:abdomen soft, non-tender and normal bowel sounds Musculoskeletal:no cyanosis of digits and no  clubbing  PSYCH: alert & oriented x 3 with fluent speech NEURO: no focal motor/sensory deficits SKIN:  no rashes or significant lesions  LABORATORY DATA:  I have reviewed the data as listed    Component Value Date/Time   NA 135 10/20/2017 1324   NA 140 03/20/2016 1010   NA 138 09/22/2012 1600   K 3.6 10/20/2017 1324   K 3.9 09/22/2012 1600   CL 103 10/20/2017 1324   CL 103 09/22/2012 1600   CO2 23 10/20/2017 1324   CO2 23 09/22/2012 1600   GLUCOSE 136 (H) 10/20/2017 1324   GLUCOSE 78 09/22/2012 1600   BUN 8 10/20/2017 1324   BUN 10 03/20/2016 1010   BUN 12 09/22/2012 1600   CREATININE 0.67 10/20/2017 1324   CREATININE 0.86 09/22/2012 1600   CALCIUM 8.5 (L) 10/20/2017 1324   CALCIUM 9.0 09/22/2012 1600   PROT 7.4 03/20/2016 1010   PROT 7.1 09/22/2012 1600   ALBUMIN 4.8 03/20/2016 1010   ALBUMIN 3.6 09/22/2012 1600   AST 15 03/20/2016 1010   AST 12 (L) 09/22/2012 1600   ALT 11 03/20/2016 1010   ALT 18 09/22/2012 1600   ALKPHOS 62 03/20/2016 1010   ALKPHOS 106 09/22/2012 1600   BILITOT 0.3 03/20/2016 1010   BILITOT 0.3 09/22/2012 1600   GFRNONAA >60 10/20/2017 1324   GFRNONAA >60 09/22/2012 1600   GFRAA >60 10/20/2017 1324   GFRAA >60 09/22/2012 1600    No results found for: SPEP, UPEP  Lab Results  Component Value Date   WBC 14.8 (H) 10/20/2017   NEUTROABS 11.9 (H) 10/20/2017   HGB 15.7 10/20/2017   HCT 46.9 10/20/2017   MCV 91.0 10/20/2017   PLT 301 10/20/2017      Chemistry      Component Value Date/Time   NA 135 10/20/2017 1324   NA 140 03/20/2016 1010   NA 138 09/22/2012 1600   K 3.6 10/20/2017 1324   K 3.9 09/22/2012 1600   CL 103 10/20/2017 1324   CL 103 09/22/2012 1600   CO2 23 10/20/2017 1324   CO2 23 09/22/2012 1600   BUN 8 10/20/2017 1324   BUN 10 03/20/2016 1010   BUN 12 09/22/2012 1600   CREATININE 0.67 10/20/2017 1324   CREATININE 0.86 09/22/2012 1600      Component Value Date/Time   CALCIUM 8.5 (L) 10/20/2017 1324   CALCIUM 9.0  09/22/2012 1600   ALKPHOS 62 03/20/2016 1010   ALKPHOS 106 09/22/2012 1600   AST 15 03/20/2016 1010   AST 12 (L) 09/22/2012 1600   ALT 11 03/20/2016 1010   ALT 18 09/22/2012 1600   BILITOT 0.3 03/20/2016 1010   BILITOT 0.3 09/22/2012 1600        ASSESSMENT & PLAN:   Erythrocytosis #  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 Jak 2 negative. Recommend phlebotomy as patient's symptoms of headaches elevated blood pressure fatigue improved post phlebotomy.   Recommend phlebotomy 400 mL every 6 months or so; to keep Hct < 42 Today- 46; proceed with phlebotomy today.  # hx of DVT [2007]/Heterozygous factor V leiden- Left Knee- stopped coumadin x6 months. No new blood clots. On asprin.   # Blood pressure- 163/104; at home 140s/90s. Defer to PCP.  # B12 deficiency/positive intrinsic factor antibody- on sublingual.   # Patient follow-up with me in approximately 6 months/possible phlebotomy CBC. Also discussed re: red- cross.       Cammie Sickle, MD 10/20/2017 3:32 PM

## 2017-11-11 ENCOUNTER — Emergency Department
Admission: EM | Admit: 2017-11-11 | Discharge: 2017-11-11 | Disposition: A | Discharge status: Home / Self Care | Payer: BLUE CROSS/BLUE SHIELD | Attending: Student in an Organized Health Care Education/Training Program | Admitting: Student in an Organized Health Care Education/Training Program

## 2017-11-11 ENCOUNTER — Encounter: Payer: Self-pay | Admitting: Emergency Medicine

## 2017-11-11 DIAGNOSIS — Z86718 Personal history of other venous thrombosis and embolism: Secondary | ICD-10-CM | POA: Insufficient documentation

## 2017-11-11 DIAGNOSIS — Z79899 Other long term (current) drug therapy: Secondary | ICD-10-CM | POA: Diagnosis not present

## 2017-11-11 DIAGNOSIS — Z91040 Latex allergy status: Secondary | ICD-10-CM | POA: Diagnosis not present

## 2017-11-11 DIAGNOSIS — G8929 Other chronic pain: Secondary | ICD-10-CM | POA: Diagnosis not present

## 2017-11-11 DIAGNOSIS — Z87891 Personal history of nicotine dependence: Secondary | ICD-10-CM | POA: Diagnosis not present

## 2017-11-11 DIAGNOSIS — I10 Essential (primary) hypertension: Secondary | ICD-10-CM | POA: Insufficient documentation

## 2017-11-11 DIAGNOSIS — M545 Low back pain: Secondary | ICD-10-CM | POA: Insufficient documentation

## 2017-11-11 DIAGNOSIS — M5432 Sciatica, left side: Secondary | ICD-10-CM | POA: Diagnosis not present

## 2017-11-11 MED ORDER — PREDNISONE 10 MG PO TABS: 10.0000 mg | ORAL_TABLET | Freq: Two times a day (BID) | ORAL | 0 refills | Status: DC

## 2017-11-11 MED ORDER — CYCLOBENZAPRINE HCL 10 MG PO TABS
10.0000 mg | ORAL_TABLET | Freq: Once | ORAL | Status: AC
  Administered 2017-11-11: 10 mg via ORAL
  Filled 2017-11-11: qty 1

## 2017-11-11 MED ORDER — DEXAMETHASONE SODIUM PHOSPHATE 10 MG/ML IJ SOLN
10.0000 mg | Freq: Once | INTRAMUSCULAR | Status: AC
  Administered 2017-11-11: 10 mg via INTRAMUSCULAR
  Filled 2017-11-11: qty 1

## 2017-11-11 MED ORDER — CYCLOBENZAPRINE HCL 10 MG PO TABS: 5.0000 mg | ORAL_TABLET | Freq: Three times a day (TID) | ORAL | 0 refills | Status: AC | PRN

## 2017-11-11 NOTE — ED Triage Notes (Signed)
Pt in via POV with complaints of back pain x 2 days; pt reports hx of degenerative disc disease and fibromyalgia.  Pt denies any recent injury.  Pt ambulatory to triage, NAD noted at this time.

## 2017-11-11 NOTE — ED Notes (Signed)
Pt is ambulatory with a slow gait to POV with daughter. Reviewed pts dishcharge instructions and RX. No questions or concerns voiced.

## 2017-11-11 NOTE — Discharge Instructions (Signed)
Take the prescription meds as directed. Follow-up with your provider for ongoing management.

## 2017-11-11 NOTE — ED Provider Notes (Signed)
[facility] Emergency Department Provider Note ____________________________________________  Time seen: 79  I have reviewed the triage vital signs and the nursing notes.  HISTORY  Chief Complaint  Back Pain  HPI Michelle Simpson is a 46 y.o. female presents to the ED accompanied by family for evaluation of low back pain and some left sciatic irritation.  Patient with a history of lumbar DDD and fibromyalgia, presents with pain that is been increasing since Christmas.  She denies any injury, accident, trauma, or fall.  She also denies any bladder or bowel incontinence, foot drop, or leg weakness.  She reports her symptoms flare at times and this is a typical intermittent flare.  She had one muscle relaxant leftover which she does with limited benefit.  She reports in the past she has been a evaluated and treated with steroids and muscle relaxants which seem to manage her pain well.  Since now with back pain times 2 days which is a flare of her chronic, intermittent sciatic irritation.  She denies any urinary frequency, dysuria, or urgency.  Past Medical History:  Diagnosis Date  . B12 deficiency   . Degenerative disc disease, lumbar   . Depression   . DJD (degenerative joint disease)   . Eczema   . Edema   . Endometriosis   . High mean corpuscular hemoglobin concentration (MCHC)   . History of DVT (deep vein thrombosis)   . Hypertension   . IBS (irritable bowel syndrome)   . Osteoarthritis of right hip   . Panic attack     Patient Active Problem List   Diagnosis Date Noted  . Osteoarthritis of right knee 11/03/2016  . Erythrocytosis 10/14/2016  . Spondylosis of lumbar region without myelopathy or radiculopathy 07/15/2016  . Irritable bowel syndrome 04/29/2016  . Polycythemia vera ([place]) 03/30/2016  . Chronic pelvic pain in female 03/20/2016  . Allergic rhinitis 03/20/2016  . Hyperlipidemia 03/20/2016  . Osteoarthritis of right hip 03/20/2016  .  Heterozygous factor V Leiden mutation ([place]) 03/20/2016  . Personal history of DVT (deep vein thrombosis) 03/20/2016  . Primary osteoarthritis of both knees 10/23/2014  . DDD (degenerative disc disease), lumbar 10/04/2014  . Neuritis or radiculitis due to rupture of lumbar intervertebral disc 09/12/2014  . Benign essential HTN 09/05/2014  . Edema of lower extremity 09/05/2014  . Trochanteric bursitis of right hip 06/19/2014  . Endometriosis 05/06/2010    Past Surgical History:  Procedure Laterality Date  . ABDOMINAL HYSTERECTOMY    . ANKLE SURGERY Right   . ANKLE SURGERY Right    plate placed  . Bilateral knee RFA nerve ablation @ [facility] hospitals    . CHOLECYSTECTOMY    . KNEE SURGERY Left 2012  . LACRIMAL DUCT RECONSTRUCTION    . LAPAROSCOPY    . miscarriage D&C  1995  . TYMPANOSTOMY TUBE PLACEMENT  1977    Prior to Admission medications   Medication Sig Start Date End Date Taking? Authorizing Provider  acetaminophen (TYLENOL 8 HOUR) 650 MG CR tablet Take 650 mg by mouth every 8 (eight) hours as needed for pain.    [provider]  Alpha-Lipoic Acid 600 MG CAPS Take 600 mg by mouth. 07/15/16   [provider]  aspirin 81 MG tablet Take by mouth. 08/29/12   [provider]  bisoprolol (ZEBETA) 5 MG tablet TAKE 1 TABLET(5 MG) BY MOUTH DAILY 09/23/17   Carmon Ginsberg, PA  Cyanocobalamin (B-12) 3000 MCG SUBL Place under the tongue.  [provider]  cyclobenzaprine (FLEXERIL) 10 MG tablet Take 0.5 tablets (5 mg total) by mouth 3 (three) times daily as needed for up to 5 days for muscle spasms. 11/11/17 11/16/17  Ashvin Adelson, Dannielle Karvonen, PA-C  Omega-3 Fatty Acids (FISH OIL EXTRA STRENGTH) 435 MG CAPS Take by mouth. 08/29/12   [provider]  predniSONE (DELTASONE) 10 MG tablet Take 1 tablet (10 mg total) by mouth 2 (two) times daily with a meal. 11/11/17   Raschelle Wisenbaker, Dannielle Karvonen, PA-C   Allergies Atenolol; Gabapentin; Nortriptyline;  Amlodipine; Ibuprofen; Latex; Meloxicam; Metoprolol; Nsaids; and Paxil cr [paroxetine hcl er]  Family History  Problem Relation Age of Onset  . Hypertension Father   . Hypertension Mother   . Deep vein thrombosis Mother   . Narcolepsy Mother   . Hypertension Maternal Grandmother   . Hypertension Paternal Grandmother   . Hypertension Brother   . Heart attack Brother   . Liver disease Brother     Social History Social History   Tobacco Use  . Smoking status: Former Smoker    Packs/day: 1.00    Years: 20.00    Pack years: 20.00    Types: Cigarettes    Last attempt to quit: 11/17/2015    Years since quitting: 1.9  . Smokeless tobacco: Never Used  . Tobacco comment: Currently 'vapes' non-nicotine product  Substance Use Topics  . Alcohol use: Yes    Alcohol/week: 0.0 oz    Comment: 'ocassional glass of wine"  . Drug use: No    Review of Systems  Constitutional: Negative for fever. Cardiovascular: Negative for chest pain. Respiratory: Negative for shortness of breath. Gastrointestinal: Negative for abdominal pain, vomiting and diarrhea. Genitourinary: Negative for dysuria. Musculoskeletal: Positive for back pain. Skin: Negative for rash. Neurological: Negative for headaches, focal weakness or numbness. ____________________________________________  PHYSICAL EXAM:  VITAL SIGNS: ED Triage Vitals  Enc Vitals Group     BP 11/11/17 1723 (!) 160/94     Pulse Rate 11/11/17 1723 65     Resp 11/11/17 1723 16     Temp 11/11/17 1723 97.8 F (36.6 C)     Temp Source 11/11/17 1723 Oral     SpO2 11/11/17 1723 97 %     Weight 11/11/17 1724 195 lb (88.5 kg)     Height 11/11/17 1724 5\' 7"  (1.702 m)     Head Circumference --      Peak Flow --      Pain Score 11/11/17 1723 8     Pain Loc --      Pain Edu? --      Excl. in [place]? --     Constitutional: Alert and oriented. Well appearing and in no distress. Head: Normocephalic and atraumatic. Cardiovascular: Normal rate, regular  rhythm. Normal distal pulses. Respiratory: Normal respiratory effort. No wheezes/rales/rhonchi. Gastrointestinal: Soft and nontender. No distention. No CVA tenderness. Musculoskeletal:Normal spinal alignment without midline tenderness, spasm, deformity, or step-off.  Patient is tender to palpation to the left SI joint region.  She is able to transition from sit to stand without assistance.   Nontender with normal range of motion in all extremities.  Neurologic:  Mildly antalgic gait without ataxia. Normal LE DTRs bilaterally. Normal toe dorsiflexion and foot eversion.Negative seated straight leg raise bilaterally. No gross focal neurologic deficits are appreciated. Psychiatric: Mood and affect are normal. Patient exhibits appropriate insight and judgment. ____________________________________________  PROCEDURES  Procedures Decadron 10 mg IM Cyclobenzaprine 10 mg PO ____________________________________________  INITIAL IMPRESSION /  ASSESSMENT AND PLAN / ED COURSE  Patient with ED evaluation of acute on chronic lumbar sacral strain and left-sided nerve irritation.  Patient's exam is overall benign without any neuromuscular deficits appreciated.  Patient is treated in the ED with steroid injection and muscle relaxant administration.  She will be discharged with prescriptions for the same.  She is advised to follow with primary care provider or return to the ED for acutely worsening symptoms. ____________________________________________  FINAL CLINICAL IMPRESSION(S) / ED DIAGNOSES  Final diagnoses:  Acute exacerbation of chronic low back pain  Sciatica of left side      Melvenia Needles, PA-C 11/11/17 1821    Merlyn Lot, MD 11/11/17 1919

## 2017-11-12 ENCOUNTER — Ambulatory Visit: Payer: BLUE CROSS/BLUE SHIELD | Admitting: Family Medicine

## 2017-11-12 ENCOUNTER — Encounter: Payer: Self-pay | Admitting: Family Medicine

## 2017-11-12 VITALS — BP 130/82 | HR 80 | Temp 98.3°F | Resp 16 | Wt 201.8 lb

## 2017-11-12 DIAGNOSIS — M5116 Intervertebral disc disorders with radiculopathy, lumbar region: Secondary | ICD-10-CM

## 2017-11-12 MED ORDER — PREDNISONE 20 MG PO TABS: ORAL_TABLET | ORAL | 0 refills | Status: DC

## 2017-11-12 MED ORDER — HYDROCODONE-ACETAMINOPHEN 5-325 MG PO TABS: ORAL_TABLET | ORAL | 0 refills | Status: DC

## 2017-11-12 NOTE — Progress Notes (Signed)
Subjective:     Patient ID: Michelle Simpson, female   DOB: August 16, 1971, 46 y.o.   MRN: [id number] Chief Complaint  Patient presents with  . Back Pain    Patient was seen yesterday at [facility] ED. Treated with steroid injection, flexeril and prednisone.She reports that she still needs to pick up and Fexeril and the prednisone from the hospital. Reports that she might need some pain medication to help with her sleep. She reports she is a little better today.   HPI States her pain specialist at [facility] moved and may need a referral to a different one. Review of Systems     Objective:   Physical Exam  Constitutional: She appears well-developed and well-nourished. She appears distressed (mild antalgic gait ).  Musculoskeletal:  Localizes pain to her left SI area with radiation down her leg.       Assessment:    1. Neuritis or radiculitis due to rupture of lumbar intervertebral disk: will change her to longer prednisone taper. - predniSONE (DELTASONE) 20 MG tablet; Taper as follows: 3 pills for 4 days, two pills for 4 days, one pill for four days  Dispense: 24 tablet; Refill: 0 - HYDROcodone-acetaminophen (NORCO/VICODIN) 5-325 MG tablet; One every 4-6 hours as needed for pain  Dispense: 20 tablet; Refill: 0    Plan:    Will call if she wishes referral to Dr. Holley Raring at [facility] pain clinic.

## 2017-11-12 NOTE — Patient Instructions (Addendum)
Check on Dr. Holley Raring at the [facility] pain clinic and let me know if you need a referral.

## 2018-04-20 ENCOUNTER — Inpatient Hospital Stay: Payer: Self-pay

## 2018-04-20 ENCOUNTER — Inpatient Hospital Stay: Payer: Self-pay | Admitting: Internal Medicine

## 2018-11-18 ENCOUNTER — Telehealth: Payer: Self-pay

## 2018-11-18 NOTE — Telephone Encounter (Signed)
Patient advised and states that she has been trying that regimen with no relief. Patient states that she will call back tomorrow morning to arrange appt. KW

## 2018-11-18 NOTE — Telephone Encounter (Signed)
Patient has not been seen in office for this matter, she was seen in office on 11/12/18 but I do not see any mention of URI/Cold like symptoms. Would you like for me to advise patient to try otc Delsym and Mucinex first for symptom relief? KW

## 2018-11-18 NOTE — Telephone Encounter (Signed)
Patient is requesting a medication for cough and head congestion. Called into [facility] in [place].

## 2018-11-18 NOTE — Telephone Encounter (Signed)
Her last visit was a year ago. So recommend Mucinex, saline nasal spray, and Delsym. She does have high blood pressure.

## 2019-08-05 ENCOUNTER — Emergency Department: Payer: Self-pay

## 2019-08-05 ENCOUNTER — Other Ambulatory Visit: Payer: Self-pay

## 2019-08-05 ENCOUNTER — Emergency Department
Admission: EM | Admit: 2019-08-05 | Discharge: 2019-08-05 | Disposition: A | Discharge status: Home / Self Care | Payer: Self-pay | Attending: Emergency Medicine | Admitting: Emergency Medicine

## 2019-08-05 DIAGNOSIS — I10 Essential (primary) hypertension: Secondary | ICD-10-CM | POA: Insufficient documentation

## 2019-08-05 DIAGNOSIS — R2242 Localized swelling, mass and lump, left lower limb: Secondary | ICD-10-CM | POA: Insufficient documentation

## 2019-08-05 DIAGNOSIS — Y9301 Activity, walking, marching and hiking: Secondary | ICD-10-CM | POA: Insufficient documentation

## 2019-08-05 DIAGNOSIS — Z79899 Other long term (current) drug therapy: Secondary | ICD-10-CM | POA: Insufficient documentation

## 2019-08-05 DIAGNOSIS — F17210 Nicotine dependence, cigarettes, uncomplicated: Secondary | ICD-10-CM | POA: Insufficient documentation

## 2019-08-05 DIAGNOSIS — W108XXA Fall (on) (from) other stairs and steps, initial encounter: Secondary | ICD-10-CM | POA: Insufficient documentation

## 2019-08-05 DIAGNOSIS — M25562 Pain in left knee: Secondary | ICD-10-CM | POA: Insufficient documentation

## 2019-08-05 DIAGNOSIS — Z91040 Latex allergy status: Secondary | ICD-10-CM | POA: Insufficient documentation

## 2019-08-05 DIAGNOSIS — M2392 Unspecified internal derangement of left knee: Secondary | ICD-10-CM | POA: Insufficient documentation

## 2019-08-05 DIAGNOSIS — Y929 Unspecified place or not applicable: Secondary | ICD-10-CM | POA: Insufficient documentation

## 2019-08-05 DIAGNOSIS — Y998 Other external cause status: Secondary | ICD-10-CM | POA: Insufficient documentation

## 2019-08-05 MED ORDER — OXYCODONE-ACETAMINOPHEN 5-325 MG PO TABS
1.0000 | ORAL_TABLET | Freq: Once | ORAL | Status: AC
  Administered 2019-08-05: 04:00:00 1 via ORAL
  Filled 2019-08-05: qty 1

## 2019-08-05 MED ORDER — BISOPROLOL FUMARATE 5 MG PO TABS: ORAL_TABLET | ORAL | 0 refills | Status: DC

## 2019-08-05 MED ORDER — OXYCODONE-ACETAMINOPHEN 5-325 MG PO TABS: 1.0000 | ORAL_TABLET | ORAL | 0 refills | Status: DC | PRN

## 2019-08-05 NOTE — ED Triage Notes (Addendum)
Patient states that she fell down three steps yesterday and has pain and swelling to left knee. Patient states that she is suppose to be taking blood pressure medication but has not taken any in a year.

## 2019-08-05 NOTE — ED Notes (Signed)
Patient transported to X-ray 

## 2019-08-05 NOTE — ED Notes (Signed)
Pt's bp is elevated and has been high for some time due to not being able to get her bp medication. EDP made aware and pt educated on the need to follow up with a primary provider and get started back on her medication.

## 2019-08-05 NOTE — ED Notes (Signed)
Pt's left knee wrapped with ace wrap and knee immobilizer placed with no issue. Pt educated on use of immobilizer and pain medication.

## 2019-08-05 NOTE — Discharge Instructions (Signed)
1.  You may take Percocet as needed for pain. 2.  Elevate affected area and apply ice several times daily to reduce swelling. 3.  Use Ace wrap and knee immobilizer as needed for comfort. 4.  Use your crutches to help you walk. 5.  Return to the ER for worsening symptoms, increased swelling, persistent vomiting, difficulty breathing or other concerns.

## 2019-08-05 NOTE — ED Provider Notes (Signed)
[facility] Emergency Department Provider Note   ____________________________________________   First MD Initiated Contact with Patient 08/05/19 [phone]     (approximate)  I have reviewed the triage vital signs and the nursing notes.   HISTORY  Chief Complaint Knee Pain    HPI Michelle Simpson is a 48 y.o. female who presents to the ED from home with a chief complaint of left knee pain secondary to injury.  Patient states she fell down 3 steps yesterday and has had pain and swelling to her knee since.  She previously has had arthroscopy in that knee.  Denies striking head or LOC.  Denies vision changes, neck pain, chest pain, shortness of breath, abdominal pain, nausea or vomiting.       Past Medical History:  Diagnosis Date  . B12 deficiency   . Degenerative disc disease, lumbar   . Depression   . DJD (degenerative joint disease)   . Eczema   . Edema   . Endometriosis   . High mean corpuscular hemoglobin concentration (MCHC)   . History of DVT (deep vein thrombosis)   . Hypertension   . IBS (irritable bowel syndrome)   . Osteoarthritis of right hip   . Panic attack     Patient Active Problem List   Diagnosis Date Noted  . Spondylosis of lumbar region without myelopathy or radiculopathy 07/15/2016  . Irritable bowel syndrome 04/29/2016  . Polycythemia vera ([place]) 03/30/2016  . Chronic pelvic pain in female 03/20/2016  . Allergic rhinitis 03/20/2016  . Hyperlipidemia 03/20/2016  . Osteoarthritis of right hip 03/20/2016  . Heterozygous factor V Leiden mutation ([place]) 03/20/2016  . Personal history of DVT (deep vein thrombosis) 03/20/2016  . Primary osteoarthritis of both knees 10/23/2014  . DDD (degenerative disc disease), lumbar 10/04/2014  . Neuritis or radiculitis due to rupture of lumbar intervertebral disc 09/12/2014  . Benign essential HTN 09/05/2014  . Edema of lower extremity 09/05/2014  . Endometriosis 05/06/2010    Past Surgical  History:  Procedure Laterality Date  . ABDOMINAL HYSTERECTOMY    . ANKLE SURGERY Right   . ANKLE SURGERY Right    plate placed  . Bilateral knee RFA nerve ablation @ [facility]    . CHOLECYSTECTOMY    . KNEE SURGERY Left 2012  . LACRIMAL DUCT RECONSTRUCTION    . LAPAROSCOPY    . miscarriage D&C  1995  . TYMPANOSTOMY TUBE PLACEMENT  1977    Prior to Admission medications   Medication Sig Start Date End Date Taking? Authorizing Provider  acetaminophen (TYLENOL 8 HOUR) 650 MG CR tablet Take 650 mg by mouth every 8 (eight) hours as needed for pain.    [provider]  Alpha-Lipoic Acid 600 MG CAPS Take 600 mg by mouth. 07/15/16   [provider]  aspirin 81 MG tablet Take by mouth. 08/29/12   [provider]  bisoprolol (ZEBETA) 5 MG tablet TAKE 1/2 TABLET(2.5 MG) BY MOUTH DAILY 08/05/19   Paulette Blanch, MD  Cyanocobalamin (B-12) 3000 MCG SUBL Place under the tongue.    [provider]  HYDROcodone-acetaminophen (NORCO/VICODIN) 5-325 MG tablet One every 4-6 hours as needed for pain 11/12/17   Carmon Ginsberg, [place]  Omega-3 Fatty Acids (FISH OIL EXTRA STRENGTH) 435 MG CAPS Take by mouth. 08/29/12   [provider]  oxyCODONE-acetaminophen (PERCOCET/ROXICET) 5-325 MG tablet Take 1 tablet by mouth every 4 (four) hours as needed for severe pain. 08/05/19   Paulette Blanch, MD  predniSONE (DELTASONE) 20 MG tablet Taper as follows: 3 pills for 4 days, two pills for 4 days, one pill for four days 11/12/17   Carmon Ginsberg, PA    Allergies Atenolol, Gabapentin, Nortriptyline, Amlodipine, Ibuprofen, Latex, Meloxicam, Metoprolol, Nsaids, and Paxil cr [paroxetine hcl er]  Family History  Problem Relation Age of Onset  . Hypertension Father   . Hypertension Mother   . Deep vein thrombosis Mother   . Narcolepsy Mother   . Hypertension Maternal Grandmother   . Hypertension Paternal Grandmother   . Hypertension Brother   . Heart attack Brother   . Liver  disease Brother     Social History Social History   Tobacco Use  . Smoking status: Current Every Day Smoker    Packs/day: 1.00    Years: 20.00    Pack years: 20.00    Types: Cigarettes    Last attempt to quit: 11/17/2015    Years since quitting: 3.7  . Smokeless tobacco: Never Used  . Tobacco comment: Currently 'vapes' non-nicotine product  Substance Use Topics  . Alcohol use: Not Currently    Alcohol/week: 0.0 standard drinks  . Drug use: No    Review of Systems  Constitutional: No fever/chills Eyes: No visual changes. ENT: No sore throat. Cardiovascular: Denies chest pain. Respiratory: Denies shortness of breath. Gastrointestinal: No abdominal pain.  No nausea, no vomiting.  No diarrhea.  No constipation. Genitourinary: Negative for dysuria. Musculoskeletal: Positive for left knee pain and injury.  Negative for back pain. Skin: Negative for rash. Neurological: Negative for headaches, focal weakness or numbness.   ____________________________________________   PHYSICAL EXAM:  VITAL SIGNS: ED Triage Vitals  Enc Vitals Group     BP 08/05/19 0149 (!) 231/129     Pulse Rate 08/05/19 0148 86     Resp 08/05/19 0148 18     Temp 08/05/19 0148 98.4 F (36.9 C)     Temp Source 08/05/19 0148 Oral     SpO2 08/05/19 0148 97 %     Weight 08/05/19 0148 230 lb (104.3 kg)     Height 08/05/19 0148 5\' 7"  (1.702 m)     Head Circumference --      Peak Flow --      Pain Score 08/05/19 0148 8     Pain Loc --      Pain Edu? --      Excl. in [place]? --     Constitutional: Alert and oriented. Well appearing and in no acute distress. Eyes: Conjunctivae are normal. PERRL. EOMI. Head: Atraumatic. Nose: Atraumatic. Mouth/Throat: Mucous membranes are moist.  No dental malocclusion. Neck: No stridor.  No cervical spine tenderness to palpation. Cardiovascular: Normal rate, regular rhythm. Grossly normal heart sounds.  Good peripheral circulation. Respiratory: Normal respiratory effort.   No retractions. Lungs CTAB. Gastrointestinal: Soft and nontender. No distention. No abdominal bruits. No CVA tenderness. Musculoskeletal: Left knee with moderate effusion.  Limited range of motion secondary to pain.  2+ distal pulses.  Supple calf without tenderness.  Brisk, less than 5-second capillary refill. Neurologic:  Normal speech and language. No gross focal neurologic deficits are appreciated. No gait instability. Skin:  Skin is warm, dry and intact. No rash noted. Psychiatric: Mood and affect are normal. Speech and behavior are normal.  ____________________________________________   LABS (all labs ordered are listed, but only abnormal results are displayed)  Labs Reviewed - No data to display ____________________________________________  EKG  None ____________________________________________  RADIOLOGY  ED MD interpretation: No acute osseous  injury; moderate to large knee effusion  Official radiology report(s): Dg Knee Complete 4 Views Left  Result Date: 08/05/2019 CLINICAL DATA:  Pain and swelling EXAM: LEFT KNEE - COMPLETE 4+ VIEW COMPARISON:  None. FINDINGS: No fracture or malalignment. Mild patellofemoral and medial joint space degenerative changes. Moderate to large knee effusion IMPRESSION: 1. No acute osseous abnormality 2. Mild arthritis of the knee.  Moderate to large knee effusion Electronically Signed   By: Donavan Foil M.D.   On: 08/05/2019 02:15    ____________________________________________   PROCEDURES  Procedure(s) performed (including Critical Care):  Procedures   ____________________________________________   INITIAL IMPRESSION / ASSESSMENT AND PLAN / ED COURSE  As part of my medical decision making, I reviewed the following data within the [place] notes reviewed and incorporated, Radiograph reviewed, Notes from prior ED visits and [place] Controlled Substance Database     JANEI LILLA was evaluated in Emergency  Department on 08/05/2019 for the symptoms described in the history of present illness. She was evaluated in the context of the global COVID-19 pandemic, which necessitated consideration that the patient might be at risk for infection with the SARS-CoV-2 virus that causes COVID-19. Institutional protocols and algorithms that pertain to the evaluation of patients at risk for COVID-19 are in a state of rapid change based on information released by regulatory bodies including the CDC and federal and state organizations. These policies and algorithms were followed during the patient's care in the ED.    48 year old female who presents with left knee pain and swelling status post injury.  Will administer analgesia, Ace wrap left knee immobilizer.  Patient has crutches at home.  She is leaving for the beach for 1 week but will call her orthopedist Dr. Marry Guan when she returns for follow-up appointment.  Strict return precautions given.  Patient verbalizes understanding agrees with plan of care.   Clinical Course as of Aug 04 330  Sat Aug 05, 2019  0331 Elevated blood pressure noted.  Patient states she was on 2.5 mg bisoprolol and has not taken in over 1 year.  Would like a refill on her medicine.   [JS]    Clinical Course User Index [JS] Paulette Blanch, MD     ____________________________________________   FINAL CLINICAL IMPRESSION(S) / ED DIAGNOSES  Final diagnoses:  Acute pain of left knee  Internal derangement of left knee     ED Discharge Orders         Ordered    oxyCODONE-acetaminophen (PERCOCET/ROXICET) 5-325 MG tablet  Every 4 hours PRN     08/05/19 0326    bisoprolol (ZEBETA) 5 MG tablet     08/05/19 0331           Note:  This document was prepared using Dragon voice recognition software and may include unintentional dictation errors.   Paulette Blanch, MD 08/05/19 [phone]

## 2019-09-05 NOTE — Progress Notes (Signed)
[name] Female    DOB: May 31, 1971   48 y.o.   MRN: [id number] Visit Date: 09/06/2019  Today's Provider: Trinna Post, PA-C   Chief Complaint  Patient presents with  . Hypertension   Subjective:     HPI  Hypertension, follow-up:  BP Readings from Last 3 Encounters:  09/06/19 (!) 176/113  08/05/19 (!) 215/138  11/12/17 130/82    She was last seen for hypertension 2 years ago. She has been without her blood pressure medication for over a year. Recently she was seen in the ER with a BP of 215/138 and was given a one month script of her former HTN medication, bisoprolol 5 mg QD.   She reports she has been caring for her ill mother who has since passed in 06/2019. She has resumed smoking in 01/2019 and now smokes 0.75 packs per day. She has gained 30 lbs in the past two years.   She reports that she has intolerable ankle swelling when she takes amlodipine.   She reports hives to atenolol and metoprolol but manages bisoprolol fine.   She has taken HCTZ at one point. Has an Rx for lasix PRN for ankle swelling.   She has never taken a medication like lisinopril or losartan.   BP at that visit was 148/98. Management changes since that visit include none. She reports fair compliance with treatment. She is not having side effects.  She is not exercising. She is not adherent to low salt diet.   Outside blood pressures are not being checked. She is experiencing lower extremity edema.  Patient denies chest pain.   Cardiovascular risk factors include hypertension, obesity (BMI >= 30 kg/m2) and smoking/ tobacco exposure.  Use of agents associated with hypertension: none.     Weight trend: stable Wt Readings from Last 3 Encounters:  09/06/19 233 lb (105.7 kg)  08/05/19 230 lb (104.3 kg)  11/12/17 201 lb 12.8 oz (91.5 kg)    Current diet: not asked  ------------------------------------------------------------------------  Allergies  Allergen  Reactions  . Atenolol Hives    Urticaria.  . Gabapentin Swelling  . Nortriptyline Swelling  . Amlodipine Swelling  . Ibuprofen Other (See Comments)    With prolonged use swelling, HTN and congestion.  . Latex Hives    Urticaria.  . Meloxicam     Other reaction(s): Other (See Comments) Upset stomach.  . Metoprolol Hives  . Nsaids Swelling  . Paxil Cr [Paroxetine Hcl Er] Other (See Comments)    hallucination     Current Outpatient Medications:  .  acetaminophen (TYLENOL 8 HOUR) 650 MG CR tablet, Take 650 mg by mouth every 8 (eight) hours as needed for pain., Disp: , Rfl:  .  Alpha-Lipoic Acid 600 MG CAPS, Take 600 mg by mouth., Disp: , Rfl:  .  aspirin 81 MG tablet, Take by mouth., Disp: , Rfl:  .  bisoprolol (ZEBETA) 5 MG tablet, TAKE 1/2 TABLET(2.5 MG) BY MOUTH DAILY, Disp: 30 tablet, Rfl: 0 .  Cyanocobalamin (B-12) 3000 MCG SUBL, Place under the tongue., Disp: , Rfl:  .  furosemide (LASIX) 40 MG tablet, Take by mouth., Disp: , Rfl:  .  Omega-3 Fatty Acids (FISH OIL EXTRA STRENGTH) 435 MG CAPS, Take by mouth., Disp: , Rfl:   Review of Systems  Constitutional: Negative.   Respiratory: Negative.   Cardiovascular: Positive for leg swelling.    Social History   Tobacco Use  . Smoking status: Current Every Day  Smoker    Packs/day: 1.00    Years: 20.00    Pack years: 20.00    Types: Cigarettes    Last attempt to quit: 11/17/2015    Years since quitting: 3.8  . Smokeless tobacco: Never Used  . Tobacco comment: Currently 'vapes' non-nicotine product  Substance Use Topics  . Alcohol use: Not Currently    Alcohol/week: 0.0 standard drinks      Objective:   BP (!) 176/113 (BP Location: Left Arm, Patient Position: Sitting, Cuff Size: Large)   Pulse 62   Temp 97.6 F (36.4 C) (Temporal)   Resp 16   Ht 5\' 7"  (1.702 m)   Wt 233 lb (105.7 kg)   BMI 36.49 kg/m  Vitals:   09/06/19 0957 09/06/19 0959  BP: (!) 118/113 (!) 176/113  Pulse: 65 62  Resp: 16   Temp: 97.6 F  (36.4 C)   TempSrc: Temporal   Weight: 233 lb (105.7 kg)   Height: 5\' 7"  (1.702 m)   Body mass index is 36.49 kg/m.   Physical Exam Constitutional:      Appearance: Normal appearance.  Cardiovascular:     Rate and Rhythm: Normal rate and regular rhythm.     Heart sounds: Normal heart sounds.  Pulmonary:     Effort: Pulmonary effort is normal.     Breath sounds: Normal breath sounds.  Skin:    General: Skin is warm and dry.  Neurological:     Mental Status: She is alert and oriented to person, place, and time. Mental status is at baseline.  Psychiatric:        Mood and Affect: Mood normal.        Behavior: Behavior normal.      No results found for any visits on 09/06/19.     Assessment & Plan    1. Benign essential HTN  Patient has not been seen in clinic for two years. I have counseled patient in great detail that her blood pressure is severely elevated. EKG from [facility] 11/22/2018 shows left atrial enlargement which is likely due to her uncontrolled hypertension. I have counseled that ankle swelling is a side effect of amlodipine but not a true allergy and this could be used if necessary. I have counseled that her pulse is 65 and she is at risk for bradycardia if her bisoprolol is increased. I have also counseled that I think her BP is beyond the ability of bisoprolol to control it as this is not typically first line BP medication. I think she would do best on losartan-HCTZ but I am not willing to prescribe this for two years without seeing her and she will need follow up and lab monitoring. Discussed options of free or low cost clinics due to self pay status. Patient does not want to try new medication because she says she reacts poorly. She says she wants to be more conservative, uses essential oils and meditates. Counseled risks of uncontrolled BP including heart attack/stroke and also risks of increasing bisoprolol. F/u 3 months.   - bisoprolol (ZEBETA) 10 MG tablet;  Take 1 tablet (10 mg total) by mouth daily.  Dispense: 90 tablet; Refill: 0  The entirety of the information documented in the History of Present Illness, Review of Systems and Physical Exam were personally obtained by me. Portions of this information were initially documented by Lynford Humphrey, CMA and reviewed by me for thoroughness and accuracy.      Trinna Post, PA-C  [facility]

## 2019-09-06 ENCOUNTER — Encounter: Payer: Self-pay | Admitting: Physician Assistant

## 2019-09-06 ENCOUNTER — Other Ambulatory Visit: Payer: Self-pay

## 2019-09-06 ENCOUNTER — Ambulatory Visit (INDEPENDENT_AMBULATORY_CARE_PROVIDER_SITE_OTHER): Payer: Self-pay | Admitting: Physician Assistant

## 2019-09-06 VITALS — BP 176/113 | HR 62 | Temp 97.6°F | Resp 16 | Ht 67.0 in | Wt 233.0 lb

## 2019-09-06 DIAGNOSIS — I10 Essential (primary) hypertension: Secondary | ICD-10-CM

## 2019-09-06 MED ORDER — BISOPROLOL FUMARATE 10 MG PO TABS: 10.0000 mg | ORAL_TABLET | Freq: Every day | ORAL | 0 refills | Status: DC

## 2019-12-07 NOTE — Progress Notes (Signed)
[name] Female    DOB: [birth year]   49 y.o.   MRN: [id number] Visit Date: 12/08/2019  Today's Provider: Trinna Post, PA-C   Chief Complaint  Patient presents with  . Hypertension   Subjective:    I, Porsha McClurkin,CMA am acting as a Education administrator for [employer].   Hypertension, follow-up:  BP Readings from Last 3 Encounters:  12/08/19 (!) 156/96  09/06/19 (!) 176/113  08/05/19 (!) 215/138    She was last seen for hypertension 3 months ago.  BP at that visit was 176/113. Management since that visit includes no changes.She reports good compliance with treatment. She is using grapefruit oils and frankenscne in her drinks.  She is not having side effects.  She is not exercising. She is not adherent to low salt diet.   Outside blood pressures are elevated. She is experiencing none.  Patient denies chest pain, chest pressure/discomfort, exertional chest pressure/discomfort, fatigue, irregular heart beat, lower extremity edema and palpitations.   Cardiovascular risk factors include hypertension, obesity (BMI >= 30 kg/m2) and smoking/ tobacco exposure.  Use of agents associated with hypertension: none.   Wt Readings from Last 3 Encounters:  12/08/19 230 lb 9.6 oz (104.6 kg)  09/06/19 233 lb (105.7 kg)  08/05/19 230 lb (104.3 kg)   Tobacco Abuse: 0.75 packs per day.  ------------------------------------------------------------------------      Allergies  Allergen Reactions  . Atenolol Hives    Urticaria.  . Gabapentin Swelling  . Nortriptyline Swelling  . Amlodipine Swelling  . Ibuprofen Other (See Comments)    With prolonged use swelling, HTN and congestion.  . Latex Hives    Urticaria.  . Meloxicam     Other reaction(s): Other (See Comments) Upset stomach.  . Metoprolol Hives  . Nsaids Swelling  . Paxil Cr [Paroxetine Hcl Er] Other (See Comments)    hallucination     Current Outpatient Medications:  .  acetaminophen (TYLENOL  8 HOUR) 650 MG CR tablet, Take 650 mg by mouth every 8 (eight) hours as needed for pain., Disp: , Rfl:  .  Alpha-Lipoic Acid 600 MG CAPS, Take 600 mg by mouth., Disp: , Rfl:  .  aspirin 81 MG tablet, Take by mouth., Disp: , Rfl:  .  bisoprolol (ZEBETA) 5 MG tablet, TAKE 1/2 TABLET(2.5 MG) BY MOUTH DAILY, Disp: 30 tablet, Rfl: 0 .  Cyanocobalamin (B-12) 3000 MCG SUBL, Place under the tongue., Disp: , Rfl:  .  Omega-3 Fatty Acids (FISH OIL EXTRA STRENGTH) 435 MG CAPS, Take by mouth., Disp: , Rfl:  .  bisoprolol (ZEBETA) 10 MG tablet, Take 1 tablet (10 mg total) by mouth daily., Disp: 90 tablet, Rfl: 0 .  furosemide (LASIX) 40 MG tablet, Take by mouth., Disp: , Rfl:   Review of Systems  Constitutional: Negative.   HENT: Negative.   Eyes: Negative.   Respiratory: Negative.   Cardiovascular: Negative.   Gastrointestinal: Negative.   Endocrine: Negative.   Genitourinary: Negative.   Musculoskeletal: Negative.   Skin: Negative.   Allergic/Immunologic: Negative.   Neurological: Negative.   Hematological: Negative.   Psychiatric/Behavioral: Negative.     Social History   Tobacco Use  . Smoking status: Current Every Day Smoker    Packs/day: 1.00    Years: 20.00    Pack years: 20.00    Types: Cigarettes    Last attempt to quit: 11/17/2015    Years since quitting: 4.0  . Smokeless tobacco: Never Used  .  Tobacco comment: Currently 'vapes' non-nicotine product  Substance Use Topics  . Alcohol use: Not Currently    Alcohol/week: 0.0 standard drinks      Objective:   BP (!) 156/96 (BP Location: Left Arm, Patient Position: Sitting, Cuff Size: Normal)   Pulse 60   Temp (!) 97.5 F (36.4 C) (Temporal)   Wt 230 lb 9.6 oz (104.6 kg)   SpO2 95%   BMI 36.12 kg/m  Vitals:   12/08/19 1545  BP: (!) 156/96  Pulse: 60  Temp: (!) 97.5 F (36.4 C)  TempSrc: Temporal  SpO2: 95%  Weight: 230 lb 9.6 oz (104.6 kg)  Body mass index is 36.12 kg/m.   Physical Exam Constitutional:       Appearance: Normal appearance.  Cardiovascular:     Rate and Rhythm: Normal rate and regular rhythm.     Heart sounds: Normal heart sounds.  Pulmonary:     Effort: Pulmonary effort is normal.     Breath sounds: Normal breath sounds.  Skin:    General: Skin is warm and dry.  Neurological:     Mental Status: She is alert and oriented to person, place, and time. Mental status is at baseline.  Psychiatric:        Mood and Affect: Mood normal.        Behavior: Behavior normal.      No results found for any visits on 12/08/19.     Assessment & Plan    1. Essential hypertension  Patient prefers to be more conservative in her approach. Counseled that zebeta is at maximum dose. She has had intolerance to amlodipine - pedal edema. Counseled 25 mg HCTZ may not be enough to lower BP into range and she would need additional BP medications.   - hydrochlorothiazide (HYDRODIURIL) 25 MG tablet; Take 1 tablet (25 mg total) by mouth daily.  Dispense: 30 tablet; Refill: 2 - Comprehensive Metabolic Panel (CMET) - Lipid Profile - CBC with Differential  2. Benign essential HTN  - bisoprolol (ZEBETA) 10 MG tablet; Take 1 tablet (10 mg total) by mouth daily.  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 Portions of this information were initially documented by [facility] and reviewed by me for thoroughness and accuracy.   The entirety of the information documented in the History of Present Illness, Review of Systems and Physical Exam were personally obtained by me. Portions of this information were initially documented by [facility] and reviewed by me for thoroughness and accuracy.        Trinna Post, PA-C  [facility]

## 2019-12-08 ENCOUNTER — Other Ambulatory Visit: Payer: Self-pay

## 2019-12-08 ENCOUNTER — Encounter: Payer: Self-pay | Admitting: Physician Assistant

## 2019-12-08 ENCOUNTER — Ambulatory Visit (INDEPENDENT_AMBULATORY_CARE_PROVIDER_SITE_OTHER): Payer: Self-pay | Admitting: Physician Assistant

## 2019-12-08 ENCOUNTER — Other Ambulatory Visit: Payer: Self-pay | Admitting: Physician Assistant

## 2019-12-08 VITALS — BP 156/96 | HR 60 | Temp 97.5°F | Wt 230.6 lb

## 2019-12-08 DIAGNOSIS — I10 Essential (primary) hypertension: Secondary | ICD-10-CM

## 2019-12-08 DIAGNOSIS — D6851 Activated protein C resistance: Secondary | ICD-10-CM

## 2019-12-08 MED ORDER — BISOPROLOL FUMARATE 10 MG PO TABS: 10.0000 mg | ORAL_TABLET | Freq: Every day | ORAL | 0 refills | Status: DC

## 2019-12-08 MED ORDER — HYDROCHLOROTHIAZIDE 25 MG PO TABS: 25.0000 mg | ORAL_TABLET | Freq: Every day | ORAL | 2 refills | Status: DC

## 2019-12-08 MED ORDER — HYDROCHLOROTHIAZIDE 25 MG PO TABS: 25.0000 mg | ORAL_TABLET | Freq: Every day | ORAL | 0 refills | Status: DC

## 2019-12-08 NOTE — Patient Instructions (Signed)

## 2020-03-05 NOTE — Progress Notes (Deleted)
Established patient visit    [name]   DOB: 16-May-1971   49 y.o. Female  MRN: [id number] Visit Date: 03/05/2020  Today's healthcare provider: Trinna Post, PA-C   No chief complaint on file.  Subjective    HPI Hypertension, follow-up  BP Readings from Last 3 Encounters:  12/08/19 (!) 156/96  09/06/19 (!) 176/113  08/05/19 (!) 215/138   Wt Readings from Last 3 Encounters:  12/08/19 230 lb 9.6 oz (104.6 kg)  09/06/19 233 lb (105.7 kg)  08/05/19 230 lb (104.3 kg)     She was last seen for hypertension on 12/08/2019, 3 months ago.  BP at that visit was 156/96 Management since that visit includes added Hydrochlorothiazide 25 MG tablet; Take 1 tablet (25 mg total) by mouth daily. .  She reports {excellent/good/fair/poor:19665} compliance with treatment. She {is/is not:9024} having side effects. {document side effects if present:1} She is following a {diet:21022986} diet. She {is/is not:9024} exercising. She {does/does not:200015} smoke.  Use of agents associated with hypertension: {bp agents assoc with hypertension:511::"none"}.   Outside blood pressures are {***enter patient reported home BP readings, or 'not being checked':1}. Symptoms: {Yes/No:20286} chest pain {Yes/No:20286} chest pressure/discomfort {Yes/No:20286} dyspnea (difficulty breathing) {Yes/No:20286} lower extremity edema {Yes/No:20286} orthopnea  {Yes/No:20286} palpitations {Yes/No:20286} paroxysmal nocturnal dyspnea  {Yes/No:20286} syncope  Pertinent labs: Lab Results  Component Value Date   CHOL 259 (H) 03/20/2016   HDL 62 03/20/2016   LDLCALC 174 (H) 03/20/2016   TRIG 116 03/20/2016   CHOLHDL 4.2 03/20/2016   Lab Results  Component Value Date   NA 135 10/20/2017   K 3.6 10/20/2017   CO2 23 10/20/2017   GLUCOSE 136 (H) 10/20/2017   BUN 8 10/20/2017   CREATININE 0.67 10/20/2017   CALCIUM 8.5 (L) 10/20/2017   GFRNONAA >60 10/20/2017   GFRAA >60 10/20/2017     The ASCVD  Risk score (Goff DC Jr., et al., 2013) failed to calculate for the following reasons:   Cannot find a previous HDL lab   Cannot find a previous total cholesterol lab   ---------------------------------------------------------------------------------------------------  {Show patient history (optional):23778::" "}   Medications: Outpatient Medications Prior to Visit  Medication Sig  . acetaminophen (TYLENOL 8 HOUR) 650 MG CR tablet Take 650 mg by mouth every 8 (eight) hours as needed for pain.  . Alpha-Lipoic Acid 600 MG CAPS Take 600 mg by mouth.  Marland Kitchen aspirin 81 MG tablet Take by mouth.  . bisoprolol (ZEBETA) 10 MG tablet Take 1 tablet (10 mg total) by mouth daily.  . Cyanocobalamin (B-12) 3000 MCG SUBL Place under the tongue.  . furosemide (LASIX) 40 MG tablet Take by mouth.  . hydrochlorothiazide (HYDRODIURIL) 25 MG tablet Take 1 tablet (25 mg total) by mouth daily.  . Omega-3 Fatty Acids (FISH OIL EXTRA STRENGTH) 435 MG CAPS Take by mouth.   No facility-administered medications prior to visit.    Review of Systems  Constitutional: Negative.   Respiratory: Negative.   Cardiovascular: Negative.   Hematological: Negative.     {Show previous labs (optional):23779::" "}   Objective    There were no vitals taken for this visit. {Show previous vital signs (optional):23777::" "}  Physical Exam  ***  No results found for any visits on 03/11/20.   Assessment & Plan    ***  No follow-ups on file.      {provider attestation***:1}   Paulene Floor  [facility] [phone] (phone) [phone] (fax)  [place]

## 2020-03-08 ENCOUNTER — Other Ambulatory Visit: Payer: Self-pay | Admitting: Physician Assistant

## 2020-03-08 DIAGNOSIS — I10 Essential (primary) hypertension: Secondary | ICD-10-CM

## 2020-03-08 MED ORDER — BISOPROLOL FUMARATE 10 MG PO TABS: 10.0000 mg | ORAL_TABLET | Freq: Every day | ORAL | 0 refills | Status: DC

## 2020-03-08 MED ORDER — HYDROCHLOROTHIAZIDE 25 MG PO TABS: 25.0000 mg | ORAL_TABLET | Freq: Every day | ORAL | 2 refills | Status: DC

## 2020-03-08 NOTE — Telephone Encounter (Signed)
Copied from [place] [id number]. Topic: Quick Communication - Rx Refill/Question >> Mar 08, 2020  1:32 PM Mcneil, Ja-Kwan wrote: Medication: bisoprolol (ZEBETA) 10 MG tablet and hydrochlorothiazide (HYDRODIURIL) 25 MG tablet  Has the patient contacted their pharmacy? no  Preferred Pharmacy (with phone number or street name): [facility] [place], [place] Phone: [phone]   Fax: [phone]  Agent: Please be advised that RX refills may take up to 3 business days. We ask that you follow-up with your pharmacy.

## 2020-03-11 ENCOUNTER — Ambulatory Visit: Payer: Self-pay | Admitting: Physician Assistant

## 2020-03-21 NOTE — Progress Notes (Deleted)
     Established patient visit   [name]   DOB: 11/04/1971   49 y.o. Female  MRN: [id number] Visit Date: 03/25/2020  Today's healthcare provider: Trinna Post, PA-C   No chief complaint on file.  Subjective    HPI Hypertension, follow-up  BP Readings from Last 3 Encounters:  12/08/19 (!) 156/96  09/06/19 (!) 176/113  08/05/19 (!) 215/138   Wt Readings from Last 3 Encounters:  12/08/19 230 lb 9.6 oz (104.6 kg)  09/06/19 233 lb (105.7 kg)  08/05/19 230 lb (104.3 kg)     She was last seen for hypertension 4 months ago.  BP at that visit was 156/96. Management since that visit includes started bisoprolol 10 MG tablet hydrochlorothiazide 25 MG tablet.  She reports {excellent/good/fair/poor:19665} compliance with treatment. She {is/is not:9024} having side effects. {document side effects if present:1} She is following a {diet:21022986} diet. She {is/is not:9024} exercising. She {does/does not:200015} smoke.  Use of agents associated with hypertension: {bp agents assoc with hypertension:511::"none"}.   Outside blood pressures are {***enter patient reported home BP readings, or 'not being checked':1}. Symptoms: {Yes/No:20286} chest pain {Yes/No:20286} chest pressure {Yes/No:20286} palpitations {Yes/No:20286} dyspnea {Yes/No:20286} orthopnea {Yes/No:20286} paroxysmal nocturnal dyspnea {Yes/No:20286} lower extremity edema {Yes/No:20286} syncope   Pertinent labs: Lab Results  Component Value Date   CHOL 259 (H) 03/20/2016   HDL 62 03/20/2016   LDLCALC 174 (H) 03/20/2016   TRIG 116 03/20/2016   CHOLHDL 4.2 03/20/2016   Lab Results  Component Value Date   NA 135 10/20/2017   K 3.6 10/20/2017   CO2 23 10/20/2017   GLUCOSE 136 (H) 10/20/2017   BUN 8 10/20/2017   CREATININE 0.67 10/20/2017   CALCIUM 8.5 (L) 10/20/2017   GFRNONAA >60 10/20/2017   GFRAA >60 10/20/2017     The ASCVD Risk score (Goff DC Jr., et al., 2013) failed to calculate for  the following reasons:   Cannot find a previous HDL lab   Cannot find a previous total cholesterol lab   ---------------------------------------------------------------------------------------------------   {Show patient history (optional):23778::" "}   Medications: Outpatient Medications Prior to Visit  Medication Sig  . acetaminophen (TYLENOL 8 HOUR) 650 MG CR tablet Take 650 mg by mouth every 8 (eight) hours as needed for pain.  . Alpha-Lipoic Acid 600 MG CAPS Take 600 mg by mouth.  Marland Kitchen aspirin 81 MG tablet Take by mouth.  . bisoprolol (ZEBETA) 10 MG tablet Take 1 tablet (10 mg total) by mouth daily.  . Cyanocobalamin (B-12) 3000 MCG SUBL Place under the tongue.  . furosemide (LASIX) 40 MG tablet Take by mouth.  . hydrochlorothiazide (HYDRODIURIL) 25 MG tablet Take 1 tablet (25 mg total) by mouth daily.  . Omega-3 Fatty Acids (FISH OIL EXTRA STRENGTH) 435 MG CAPS Take by mouth.   No facility-administered medications prior to visit.    Review of Systems  Constitutional: Negative.   Respiratory: Negative.   Cardiovascular: Negative.   Hematological: Negative.     {Show previous labs (optional):23779::" "}  Objective    There were no vitals taken for this visit. {Show previous vital signs (optional):23777::" "}  Physical Exam  ***  No results found for any visits on 03/25/20.  Assessment & Plan     ***  No follow-ups on file.      {provider attestation***:1}   Paulene Floor  [facility] [phone] (phone) [phone] (fax)  [place]

## 2020-03-25 ENCOUNTER — Ambulatory Visit: Payer: Self-pay | Admitting: Physician Assistant

## 2020-04-08 ENCOUNTER — Ambulatory Visit: Payer: Self-pay | Admitting: Physician Assistant

## 2020-05-13 ENCOUNTER — Ambulatory Visit: Payer: Self-pay | Admitting: Physician Assistant

## 2020-05-13 NOTE — Progress Notes (Deleted)
      Established patient visit   [name]   DOB: 1971/06/23   49 y.o. Female  MRN: [id number] Visit Date: 05/13/2020  Today's healthcare provider: Trinna Post, PA-C   No chief complaint on file.  Subjective    HPI Hypertension, follow-up  BP Readings from Last 3 Encounters:  12/08/19 (!) 156/96  09/06/19 (!) 176/113  08/05/19 (!) 215/138   Wt Readings from Last 3 Encounters:  12/08/19 230 lb 9.6 oz (104.6 kg)  09/06/19 233 lb (105.7 kg)  08/05/19 230 lb (104.3 kg)     She was last seen for hypertension 4 months ago.  BP at that visit was 156/96. Management since that visit includes no changes. However, if BP doesn't improve, will need an additional medication.   She reports {excellent/good/fair/poor:19665} compliance with treatment. She {is/is not:9024} having side effects. {document side effects if present:1} She is following a {diet:21022986} diet. She {is/is not:9024} exercising. She {does/does not:200015} smoke.  Use of agents associated with hypertension: {bp agents assoc with hypertension:511::"none"}.   Outside blood pressures are {***enter patient reported home BP readings, or 'not being checked':1}. Symptoms: {Yes/No:20286} chest pain {Yes/No:20286} chest pressure  {Yes/No:20286} palpitations {Yes/No:20286} syncope  {Yes/No:20286} dyspnea {Yes/No:20286} orthopnea  {Yes/No:20286} paroxysmal nocturnal dyspnea {Yes/No:20286} lower extremity edema   Pertinent labs: Lab Results  Component Value Date   CHOL 259 (H) 03/20/2016   HDL 62 03/20/2016   LDLCALC 174 (H) 03/20/2016   TRIG 116 03/20/2016   CHOLHDL 4.2 03/20/2016   Lab Results  Component Value Date   NA 135 10/20/2017   K 3.6 10/20/2017   CREATININE 0.67 10/20/2017   GFRNONAA >60 10/20/2017   GFRAA >60 10/20/2017   GLUCOSE 136 (H) 10/20/2017     The ASCVD Risk score (Goff DC Jr., et al., 2013) failed to calculate for the following reasons:   Cannot find a previous HDL lab    Cannot find a previous total cholesterol lab     {Show patient history (optional):23778::" "}   Medications: Outpatient Medications Prior to Visit  Medication Sig  . acetaminophen (TYLENOL 8 HOUR) 650 MG CR tablet Take 650 mg by mouth every 8 (eight) hours as needed for pain.  . Alpha-Lipoic Acid 600 MG CAPS Take 600 mg by mouth.  Marland Kitchen aspirin 81 MG tablet Take by mouth.  . bisoprolol (ZEBETA) 10 MG tablet Take 1 tablet (10 mg total) by mouth daily.  . Cyanocobalamin (B-12) 3000 MCG SUBL Place under the tongue.  . furosemide (LASIX) 40 MG tablet Take by mouth.  . hydrochlorothiazide (HYDRODIURIL) 25 MG tablet Take 1 tablet (25 mg total) by mouth daily.  . Omega-3 Fatty Acids (FISH OIL EXTRA STRENGTH) 435 MG CAPS Take by mouth.   No facility-administered medications prior to visit.    Review of Systems  {Heme  Chem  Endocrine  Serology  Results Review (optional):23779::" "}  Objective    There were no vitals taken for this visit. {Show previous vital signs (optional):23777::" "}  Physical Exam  ***  No results found for any visits on 05/13/20.  Assessment & Plan     ***  No follow-ups on file.      {provider attestation***:1}   Paulene Floor  [facility] [phone] (phone) [phone] (fax)  [place]

## 2020-06-08 ENCOUNTER — Other Ambulatory Visit: Payer: Self-pay | Admitting: Physician Assistant

## 2020-06-08 DIAGNOSIS — I10 Essential (primary) hypertension: Secondary | ICD-10-CM
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
## 2020-06-11 NOTE — Telephone Encounter (Signed)
Patient did not show up for appointment as scheduled. She also hasn't had labwork in three years and is on medications monitoring this. BP at last visit uncontrolled. I'll give her two weeks of the medication so she can get back into the clinic as a courtesy refill.

## 2020-06-13 NOTE — Telephone Encounter (Signed)
Called patient to schedule appointment per [facility] and no answer. Left voicemail for patient to return call, if patient calls back okay for [facility] to advise patient of message and schedule appointment.

## 2020-12-17 ENCOUNTER — Ambulatory Visit (INDEPENDENT_AMBULATORY_CARE_PROVIDER_SITE_OTHER): Payer: 59 | Admitting: Adult Health

## 2020-12-17 ENCOUNTER — Other Ambulatory Visit: Payer: Self-pay

## 2020-12-17 ENCOUNTER — Encounter: Payer: Self-pay | Admitting: Adult Health

## 2020-12-17 VITALS — BP 188/133 | HR 93 | Temp 98.1°F | Resp 16 | Wt 224.4 lb

## 2020-12-17 DIAGNOSIS — G4700 Insomnia, unspecified: Secondary | ICD-10-CM

## 2020-12-17 DIAGNOSIS — F4321 Adjustment disorder with depressed mood: Secondary | ICD-10-CM | POA: Diagnosis not present

## 2020-12-17 DIAGNOSIS — I10 Essential (primary) hypertension: Secondary | ICD-10-CM

## 2020-12-17 DIAGNOSIS — F419 Anxiety disorder, unspecified: Secondary | ICD-10-CM

## 2020-12-17 MED ORDER — BISOPROLOL FUMARATE 10 MG PO TABS: ORAL_TABLET | ORAL | 0 refills | Status: DC

## 2020-12-17 MED ORDER — HYDROCHLOROTHIAZIDE 25 MG PO TABS: 25.0000 mg | ORAL_TABLET | Freq: Every day | ORAL | 0 refills | Status: DC

## 2020-12-17 NOTE — Progress Notes (Signed)
Established patient visit   [name]   DOB: 10-Mar-1971   50 y.o. Female  MRN: [id number] Visit Date: 12/17/2020  Today's healthcare provider: Marcille Buffy, FNP   Chief Complaint  Patient presents with  . Hypertension   Subjective    HPI  Hypertension, follow-up  She has been out of her blood pressure medication she reports 4- 5 months. She has not had ordered labs in over three years. Trinna Post, PA-C Advised her to come for labs and recheck and courtesy refill was given in July. Patient is here today for that follow up.   See patient message copied in for continuity of care  Below:  [place], Courtney Heys, CMA to [place], [place]    06/13/20 11:14 AM Good morning, Michelle Simpson is requesting you to schedule a follow-up appointment and to have labs as well. She only send in two weeks of your Zebeta 10 MG medication and would like for you to call and schedule appointment to be seen. Please give the office a call at [phone] to schedule appointment.  Her fiance recently passed away unexpectedly. She is now caring for his autistic nephew who is 20 mg. She has had increased anxiety with this she reports. She denies any suicidal or homicidal ideations or intents.     BP Readings from Last 3 Encounters:  12/17/20 (!) 188/133  12/08/19 (!) 156/96  09/06/19 (!) 176/113   Wt Readings from Last 3 Encounters:  12/17/20 224 lb 6.4 oz (101.8 kg)  12/08/19 230 lb 9.6 oz (104.6 kg)  09/06/19 233 lb (105.7 kg)     She was last seen for hypertension 1 years ago.  BP at that visit was 156/96. Management since that visit includes started patient on HCTZ 25mg  and Bisoprolol 10mg .  She reports excellent compliance with treatment. Patient reports that she ran out of medication 6 months ago She is not having side effects.  She is following a poor diet. Patient reports only eating one meal a day She is not exercising. She does smoke.  Use of agents associated  with hypertension: none.   Outside blood pressures are systolic 542-706 and diastolic 237-628. Symptoms: No chest pain No chest pressure  Yes palpitations 1- 2 months before fiance died on 11-18-2023.  No syncope  No dyspnea No orthopnea  No paroxysmal nocturnal dyspnea Yes lower extremity edema since being off fluid pill.    Pertinent labs: Lab Results  Component Value Date   CHOL 259 (H) 03/20/2016   HDL 62 03/20/2016   LDLCALC 174 (H) 03/20/2016   TRIG 116 03/20/2016   CHOLHDL 4.2 03/20/2016   Lab Results  Component Value Date   NA 135 10/20/2017   K 3.6 10/20/2017   CREATININE 0.67 10/20/2017   GFRNONAA >60 10/20/2017   GFRAA >60 10/20/2017   GLUCOSE 136 (H) 10/20/2017     The ASCVD Risk score (Goff DC Jr., et al., 2013) failed to calculate for the following reasons:   Cannot find a previous HDL lab   Cannot find a previous total cholesterol lab   Patient  denies any fever, body aches,chills, rash, chest pain, shortness of breath, nausea, vomiting, or diarrhea.  Denies dizziness, lightheadedness, pre syncopal or syncopal episodes.   ---------------------------------------------------------------------------------------------------  Patient Active Problem List   Diagnosis Date Noted  . Spondylosis of lumbar region without myelopathy or radiculopathy 07/15/2016  . Irritable bowel syndrome 04/29/2016  . 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  Chronic pelvic pain in female 03/20/2016  . Allergic rhinitis 03/20/2016  . Hyperlipidemia 03/20/2016  . Osteoarthritis of hip 03/20/2016  . Heterozygous factor V Leiden mutation ([place]) 03/20/2016  . History of deep venous thrombosis 03/20/2016  . Primary osteoarthritis of both knees 10/23/2014  . DDD (degenerative disc disease), lumbar 10/04/2014  . Chondromalacia of knee, 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  . Neuritis or radiculitis due to rupture of lumbar intervertebral disc 09/12/2014  . Lumbar radiculitis 09/12/2014  . Edema of lower  extremity 09/05/2014  . Bilateral leg edema 09/05/2014  . Hip impingement syndrome 06/19/2014  . Endometriosis 05/06/2010  . Essential hypertension 03/11/2009   Past Medical History:  Diagnosis Date  . B12 deficiency   . Degenerative disc disease, lumbar   . Depression   . DJD (degenerative joint disease)   . Eczema   . Edema   . Endometriosis   . High mean corpuscular hemoglobin concentration (MCHC)   . History of DVT (deep vein thrombosis)   . Hypertension   . IBS (irritable bowel syndrome)   . Osteoarthritis of right hip   . Panic attack    Allergies  Allergen Reactions  . Atenolol Hives    Urticaria.  . Gabapentin Swelling  . Nortriptyline Swelling  . Amlodipine Swelling  . Ibuprofen Other (See Comments)    With prolonged use swelling, HTN and congestion.  . Latex Hives    Urticaria.  . Meloxicam     Other reaction(s): Other (See Comments) Upset stomach.  . Metoprolol Hives  . Nsaids Swelling  . Paxil Cr [Paroxetine Hcl Er] Other (See Comments)    hallucination       Medications: Outpatient Medications Prior to Visit  Medication Sig  . acetaminophen (TYLENOL) 650 MG CR tablet Take 650 mg by mouth every 8 (eight) hours as needed for pain. (Patient not taking: Reported on 12/17/2020)  . Alpha-Lipoic Acid 600 MG CAPS Take 600 mg by mouth. (Patient not taking: Reported on 12/17/2020)  . aspirin 81 MG tablet Take by mouth. (Patient not taking: Reported on 12/17/2020)  . Cyanocobalamin (B-12) 3000 MCG SUBL Place under the tongue. (Patient not taking: Reported on 12/17/2020)  . Omega-3 Fatty Acids (FISH OIL EXTRA STRENGTH) 435 MG CAPS Take by mouth. (Patient not taking: Reported on 12/17/2020)  . [DISCONTINUED] bisoprolol (ZEBETA) 10 MG tablet TAKE 1 TABLET(10 MG) BY MOUTH DAILY (Patient not taking: Reported on 12/17/2020)  . [DISCONTINUED] furosemide (LASIX) 40 MG tablet Take by mouth. (Patient not taking: Reported on 12/17/2020)  . [DISCONTINUED] hydrochlorothiazide  (HYDRODIURIL) 25 MG tablet Take 1 tablet (25 mg total) by mouth daily.   No facility-administered medications prior to visit.    Review of Systems  Constitutional: Positive for fatigue. Negative for activity change, appetite change, chills, diaphoresis, fever and unexpected weight change.  HENT: Negative.   Respiratory: Negative.   Cardiovascular: Positive for palpitations (since losing her fiance. and occasional only. not lasting more than few seconds. ). Negative for chest pain and leg swelling.  Gastrointestinal: Negative.   Genitourinary: Negative.   Musculoskeletal: Negative.   Skin: Negative.   Neurological: Negative.   Psychiatric/Behavioral: Negative.     Last CBC Lab Results  Component Value Date   WBC 14.8 (H) 10/20/2017   HGB 15.7 10/20/2017   HCT 46.9 10/20/2017   MCV 91.0 10/20/2017   MCH 30.4 10/20/2017   RDW 14.2 10/20/2017   PLT 301 123456   Last metabolic panel Lab Results  Component Value Date   GLUCOSE 136 (H)  10/20/2017   NA 135 10/20/2017   K 3.6 10/20/2017   CL 103 10/20/2017   CO2 23 10/20/2017   BUN 8 10/20/2017   CREATININE 0.67 10/20/2017   GFRNONAA >60 10/20/2017   GFRAA >60 10/20/2017   CALCIUM 8.5 (L) 10/20/2017   PROT 7.4 03/20/2016   ALBUMIN 4.8 03/20/2016   LABGLOB 2.6 03/20/2016   AGRATIO 1.8 03/20/2016   BILITOT 0.3 03/20/2016   ALKPHOS 62 03/20/2016   AST 15 03/20/2016   ALT 11 03/20/2016   ANIONGAP 9 10/20/2017       Objective    BP (!) 188/133   Pulse 93   Temp 98.1 F (36.7 C) (Oral)   Resp 16   Wt 224 lb 6.4 oz (101.8 kg)   BMI 35.15 kg/m  BP Readings from Last 3 Encounters:  12/17/20 (!) 188/133  12/08/19 (!) 156/96  09/06/19 (!) 176/113   Wt Readings from Last 3 Encounters:  12/17/20 224 lb 6.4 oz (101.8 kg)  12/08/19 230 lb 9.6 oz (104.6 kg)  09/06/19 233 lb (105.7 kg)       Physical Exam Constitutional:      General: She is not in acute distress.    Appearance: Normal appearance. She is obese.  She is not ill-appearing, toxic-appearing or diaphoretic.  HENT:     Head: Normocephalic.     Right Ear: External ear normal. There is no impacted cerumen.     Left Ear: External ear normal. There is no impacted cerumen.     Nose: Nose normal. No congestion or rhinorrhea.     Mouth/Throat:     Mouth: Mucous membranes are moist.     Pharynx: No oropharyngeal exudate or posterior oropharyngeal erythema.  Cardiovascular:     Rate and Rhythm: Normal rate and regular rhythm.     Pulses: Normal pulses.     Heart sounds: Normal heart sounds. No murmur heard. No friction rub. No gallop.   Pulmonary:     Effort: Pulmonary effort is normal. No respiratory distress.     Breath sounds: Normal breath sounds. No stridor. No wheezing, rhonchi or rales.  Chest:     Chest wall: No tenderness.  Abdominal:     General: There is no distension.     Palpations: Abdomen is soft.     Tenderness: There is no abdominal tenderness.  Musculoskeletal:     Cervical back: Normal range of motion and neck supple. No tenderness.  Lymphadenopathy:     Cervical: No cervical adenopathy.  Neurological:     Mental Status: She is alert.       No results found for any visits on 12/17/20.  Assessment & Plan     Essential hypertension - Plan: CBC with Differential/Platelet, Comprehensive Metabolic Panel (CMET), TSH, Lipid Panel w/o Chol/HDL Ratio, hydrochlorothiazide (HYDRODIURIL) 25 MG tablet  Grieving - Plan: Ambulatory referral to Psychiatry  Benign essential HTN - Plan: bisoprolol (ZEBETA) 10 MG tablet  Anxiety - Plan: Ambulatory referral to Psychiatry  Insomnia, unspecified type   Labs today.  Meds ordered this encounter  Medications  . hydrochlorothiazide (HYDRODIURIL) 25 MG tablet    Sig: Take 1 tablet (25 mg total) by mouth daily.    Dispense:  30 tablet    Refill:  0  . bisoprolol (ZEBETA) 10 MG tablet    Sig: TAKE 1 TABLET(10 MG) BY MOUTH DAILY    Dispense:  30 tablet    Refill:  0    Restart medications as above. ' Call  If not heard from psychiatry referral within 2 weeks.  Diet , exercise, and weight loss.  Follow up for missed primary care with PCP.   Return in about 2 weeks (around 12/31/2020), or if symptoms worsen or fail to improve, for at any time for any worsening symptoms, Go to Emergency room/ urgent care if worse.      {The entirety of the information documented in the History of Present Illness, Review of Systems and Physical Exam were personally obtained by me. Portions of this information were initially documented by the CMA and reviewed by me for thoroughness and accuracy.    Marcille Buffy, [place] [phone] (phone) [phone] (fax)  [place]

## 2020-12-17 NOTE — Patient Instructions (Signed)
Psychiatric/Counseling Resources Discussed As Follows:  If Emergency please seek Emergency Room Care Immediately or Call 911.   [facility] Address:  [place], [place] [address] Phone: [phone] Website : [email or web address]   [facility] Address:  [address] [place], [place] [address] Phone: [phone] Fax: [phone] Website: [email or web address] How To Access Our Services Because our main goal is to meet the needs of our consumers, [facility] operates on a walk-in basis! To access services, there are just 3 easy steps: 1) Walk in any Monday, Wednesday or Friday between 8:00 am and 3:00 pm and complete our consumer paperwork 2) A Comprehensive Clinical Assessment (CCA) will be completed and appropriate service recommendations will be provided 3) Recommendations are sent to [facility] team members and the appropriate staff will call you within days. Advanced Access Open M - F, 8:00 am - 8:00 pm  Mental health crisis services for all age groups  Triage  Psychiatric Evaluations  Involuntary Commitments  [facility]  Address: [address], [place], [place] [address] Website : [email or web address] Walk in's accepted see web site or call for more information Phone : [phone] Also has [facility] [phone]    Psychology Today Find a therapist by searching online in your area or specialist by your diagnosis Website:  [email or web address]        Hypertension, Adult Hypertension is another name for high blood pressure. High blood pressure forces your heart to work harder to pump blood. This can cause problems over time. There are two numbers in a blood pressure reading. There is a top number (systolic) over a bottom number (diastolic). It is best to have a blood pressure that is below 120/80. Healthy choices can help lower your  blood pressure, or you may need medicine to help lower it. What are the causes? The cause of this condition is not known. Some conditions may be related to high blood pressure. What increases the risk?  Smoking.  Having type 2 diabetes mellitus, high cholesterol, or both.  Not getting enough exercise or physical activity.  Being overweight.  Having too much fat, sugar, calories, or salt (sodium) in your diet.  Drinking too much alcohol.  Having long-term (chronic) kidney disease.  Having a family history of high blood pressure.  Age. Risk increases with age.  Race. You may be at higher risk if you are African American.  Gender. Men are at higher risk than women before age 36. After age 23, women are at higher risk than men.  Having obstructive sleep apnea.  Stress. What are the signs or symptoms?  High blood pressure may not cause symptoms. Very high blood pressure (hypertensive crisis) may cause: ? Headache. ? Feelings of worry or nervousness (anxiety). ? Shortness of breath. ? Nosebleed. ? A feeling of being sick to your stomach (nausea). ? Throwing up (vomiting). ? Changes in how you see. ? Very bad chest pain. ? Seizures. How is this treated?  This condition is treated by making healthy lifestyle changes, such as: ? Eating healthy foods. ? Exercising more. ? Drinking less alcohol.  Your health care provider may prescribe medicine if lifestyle changes are not enough to get your blood pressure under control, and if: ? Your top number is above 130. ? Your bottom number is above 80.  Your personal target blood pressure may vary. Follow these instructions at home: Eating and drinking  If told, follow the DASH eating plan. To follow this plan: ?  Fill one half of your plate at each meal with fruits and vegetables. ? Fill one fourth of your plate at each meal with whole grains. Whole grains include whole-wheat pasta, brown rice, and whole-grain bread. ? Eat or  drink low-fat dairy products, such as skim milk or low-fat yogurt. ? Fill one fourth of your plate at each meal with low-fat (lean) proteins. Low-fat proteins include fish, chicken without skin, eggs, beans, and tofu. ? Avoid fatty meat, cured and processed meat, or chicken with skin. ? Avoid pre-made or processed food.  Eat less than 1,500 mg of salt each day.  Do not drink alcohol if: ? Your doctor tells you not to drink. ? You are pregnant, may be pregnant, or are planning to become pregnant.  If you drink alcohol: ? Limit how much you use to:  0-1 drink a day for women.  0-2 drinks a day for men. ? Be aware of how much alcohol is in your drink. In the [place] one drink equals one 12 oz bottle of beer (355 mL), one 5 oz glass of wine (148 mL), or one 1 oz glass of hard liquor (44 mL).   Lifestyle  Work with your doctor to stay at a healthy weight or to lose weight. Ask your doctor what the best weight is for you.  Get at least 30 minutes of exercise most days of the week. This may include walking, swimming, or biking.  Get at least 30 minutes of exercise that strengthens your muscles (resistance exercise) at least 3 days a week. This may include lifting weights or doing Pilates.  Do not use any products that contain nicotine or tobacco, such as cigarettes, e-cigarettes, and chewing tobacco. If you need help quitting, ask your doctor.  Check your blood pressure at home as told by your doctor.  Keep all follow-up visits as told by your doctor. This is important.   Medicines  Take over-the-counter and prescription medicines only as told by your doctor. Follow directions carefully.  Do not skip doses of blood pressure medicine. The medicine does not work as well if you skip doses. Skipping doses also puts you at risk for problems.  Ask your doctor about side effects or reactions to medicines that you should watch for. Contact a doctor if you:  Think you are having a reaction to  the medicine you are taking.  Have headaches that keep coming back (recurring).  Feel dizzy.  Have swelling in your ankles.  Have trouble with your vision. Get help right away if you:  Get a very bad headache.  Start to feel mixed up (confused).  Feel weak or numb.  Feel faint.  Have very bad pain in your: ? Chest. ? Belly (abdomen).  Throw up more than once.  Have trouble breathing. Summary  Hypertension is another name for high blood pressure.  High blood pressure forces your heart to work harder to pump blood.  For most people, a normal blood pressure is less than 120/80.  Making healthy choices can help lower blood pressure. If your blood pressure does not get lower with healthy choices, you may need to take medicine. This information is not intended to replace advice given to you by your health care provider. Make sure you discuss any questions you have with your health care provider. Document Revised: 07/13/2018 Document Reviewed: 07/13/2018 Elsevier Patient Education  2021 [employer]. Complicated Grief Grief is a normal response to the death of someone close to you. Feelings  of fear, anger, and guilt can affect almost everyone who loses a loved one. It is also common to have symptoms of depression while you are grieving. These include problems with sleep, loss of appetite, and lack of energy. They may last for weeks or months after a loss. Complicated grief is different from normal grief or depression. Normal grieving involves sadness and feelings of loss, but those feelings get better and heal over time. Complicated grief is a severe type of grief that lasts for a long time, usually for several months to a year or longer. It interferes with your ability to function normally. Complicated grief may require treatment from a mental health care provider. What are the causes? The cause of this condition is not known. It is not clear why some people continue to struggle  with grief and others do not. What increases the risk? You are more likely to develop this condition if:  The death of your loved one was sudden or unexpected.  The death of your loved one was due to a violent event.  Your loved one died from suicide.  Your loved one was a child or a young person.  You were very close to your loved one, or you were dependent on him or her.  You have a history of depression or anxiety. What are the signs or symptoms? Symptoms of this condition include:  Feeling disbelief or having a lack of emotion (numbness).  Being unable to enjoy good memories of your loved one.  Needing to avoid anything or anyone that reminds you of your loved one.  Being unable to stop thinking about the death.  Feeling intense anger or guilt.  Feeling alone and hopeless.  Feeling that your life is meaningless and empty.  Losing the desire to move on with your life. How is this diagnosed? This condition may be diagnosed based on:  Your symptoms. Complicated grief will be diagnosed if you have ongoing symptoms of grief for 6-12 months or longer.  The effect of symptoms on your life. You may be diagnosed with this condition if your symptoms are interfering with your ability to live your life. Your health care provider may recommend that you see a mental health care provider. Many symptoms of depression are similar to the symptoms of complicated grief. It is important to be evaluated for complicated grief along with other mental health conditions. How is this treated? This condition is most commonly treated with talk therapy. This therapy is offered by a mental health specialist (psychiatrist). During therapy:  You will learn healthy ways to cope with the loss of your loved one.  Your mental health care provider may recommend antidepressant medicines.   Follow these instructions at home: Lifestyle  Take care of yourself. ? Eat on a regular basis, and maintain a  healthy diet. Eat plenty of fruits, vegetables, lean protein, and whole grains. ? Try to get some exercise each day. Aim for 30 minutes of exercise on most days of the week. ? Keep a consistent sleep schedule. Try to get 8 or more hours of sleep each night. ? Start doing the things that you used to enjoy.  Do not use drugs or alcohol to ease your symptoms.  Spend time with friends and loved ones.   General instructions  Take over-the-counter and prescription medicines only as told by your health care provider.  Consider joining a grief (bereavement) support group to help you deal with your loss.  Keep all follow-up visits  as told by your health care provider. This is important. Contact a health care provider if:  Your symptoms prevent you from functioning normally.  Your symptoms do not get better with treatment. Get help right away if:  You have serious thoughts about hurting yourself or someone else.  You have suicidal feelings. If you ever feel like you may hurt yourself or others, or have thoughts about taking your own life, get help right away. You can go to your nearest emergency department or call:  Your local emergency services (911 in the [place]  A suicide crisis helpline, such as the [place] at 7278520710. This is open 24 hours a day. Summary  Complicated grief is a severe type of grief that lasts for a long time. This grief is not likely to go away on its own. Get the help you need.  Some griefs are more difficult than others and can cause this condition. You may need a certain type of treatment to help you recover if the loss of your loved one was sudden, violent, or due to suicide.  You may feel guilty about moving on with your life. Getting help does not mean that you are forgetting your loved one. It means that you are taking care of yourself.  Complicated grief is best treated with talk therapy. Medicines may also be  prescribed.  Seek the help you need, and find support that will help you recover. This information is not intended to replace advice given to you by your health care provider. Make sure you discuss any questions you have with your health care provider. Document Revised: 04/25/2020 Document Reviewed: 04/25/2020 Elsevier Patient Education  2021 [employer]. Hypertension, Adult Hypertension is another name for high blood pressure. High blood pressure forces your heart to work harder to pump blood. This can cause problems over time. There are two numbers in a blood pressure reading. There is a top number (systolic) over a bottom number (diastolic). It is best to have a blood pressure that is below 120/80. Healthy choices can help lower your blood pressure, or you may need medicine to help lower it. What are the causes? The cause of this condition is not known. Some conditions may be related to high blood pressure. What increases the risk?  Smoking.  Having type 2 diabetes mellitus, high cholesterol, or both.  Not getting enough exercise or physical activity.  Being overweight.  Having too much fat, sugar, calories, or salt (sodium) in your diet.  Drinking too much alcohol.  Having long-term (chronic) kidney disease.  Having a family history of high blood pressure.  Age. Risk increases with age.  Race. You may be at higher risk if you are African American.  Gender. Men are at higher risk than women before age 13. After age 80, women are at higher risk than men.  Having obstructive sleep apnea.  Stress. What are the signs or symptoms?  High blood pressure may not cause symptoms. Very high blood pressure (hypertensive crisis) may cause: ? Headache. ? Feelings of worry or nervousness (anxiety). ? Shortness of breath. ? Nosebleed. ? A feeling of being sick to your stomach (nausea). ? Throwing up (vomiting). ? Changes in how you see. ? Very bad chest pain. ? Seizures. How is  this treated?  This condition is treated by making healthy lifestyle changes, such as: ? Eating healthy foods. ? Exercising more. ? Drinking less alcohol.  Your health care provider may prescribe medicine if lifestyle changes  are not enough to get your blood pressure under control, and if: ? Your top number is above 130. ? Your bottom number is above 80.  Your personal target blood pressure may vary. Follow these instructions at home: Eating and drinking  If told, follow the DASH eating plan. To follow this plan: ? Fill one half of your plate at each meal with fruits and vegetables. ? Fill one fourth of your plate at each meal with whole grains. Whole grains include whole-wheat pasta, brown rice, and whole-grain bread. ? Eat or drink low-fat dairy products, such as skim milk or low-fat yogurt. ? Fill one fourth of your plate at each meal with low-fat (lean) proteins. Low-fat proteins include fish, chicken without skin, eggs, beans, and tofu. ? Avoid fatty meat, cured and processed meat, or chicken with skin. ? Avoid pre-made or processed food.  Eat less than 1,500 mg of salt each day.  Do not drink alcohol if: ? Your doctor tells you not to drink. ? You are pregnant, may be pregnant, or are planning to become pregnant.  If you drink alcohol: ? Limit how much you use to:  0-1 drink a day for women.  0-2 drinks a day for men. ? Be aware of how much alcohol is in your drink. In the [place] one drink equals one 12 oz bottle of beer (355 mL), one 5 oz glass of wine (148 mL), or one 1 oz glass of hard liquor (44 mL).   Lifestyle  Work with your doctor to stay at a healthy weight or to lose weight. Ask your doctor what the best weight is for you.  Get at least 30 minutes of exercise most days of the week. This may include walking, swimming, or biking.  Get at least 30 minutes of exercise that strengthens your muscles (resistance exercise) at least 3 days a week. This may include  lifting weights or doing Pilates.  Do not use any products that contain nicotine or tobacco, such as cigarettes, e-cigarettes, and chewing tobacco. If you need help quitting, ask your doctor.  Check your blood pressure at home as told by your doctor.  Keep all follow-up visits as told by your doctor. This is important.   Medicines  Take over-the-counter and prescription medicines only as told by your doctor. Follow directions carefully.  Do not skip doses of blood pressure medicine. The medicine does not work as well if you skip doses. Skipping doses also puts you at risk for problems.  Ask your doctor about side effects or reactions to medicines that you should watch for. Contact a doctor if you:  Think you are having a reaction to the medicine you are taking.  Have headaches that keep coming back (recurring).  Feel dizzy.  Have swelling in your ankles.  Have trouble with your vision. Get help right away if you:  Get a very bad headache.  Start to feel mixed up (confused).  Feel weak or numb.  Feel faint.  Have very bad pain in your: ? Chest. ? Belly (abdomen).  Throw up more than once.  Have trouble breathing. Summary  Hypertension is another name for high blood pressure.  High blood pressure forces your heart to work harder to pump blood.  For most people, a normal blood pressure is less than 120/80.  Making healthy choices can help lower blood pressure. If your blood pressure does not get lower with healthy choices, you may need to take medicine. This information is not  intended to replace advice given to you by your health care provider. Make sure you discuss any questions you have with your health care provider. Document Revised: 07/13/2018 Document Reviewed: 07/13/2018 Elsevier Patient Education  2021 [employer].

## 2020-12-18 LAB — CBC WITH DIFFERENTIAL/PLATELET
Basophils Absolute: 0 10*3/uL (ref 0.0–0.2)
Basos: 0 %
EOS (ABSOLUTE): 0.2 10*3/uL (ref 0.0–0.4)
Eos: 2 %
Hematocrit: 46.1 % (ref 34.0–46.6)
Hemoglobin: 15.5 g/dL (ref 11.1–15.9)
Immature Grans (Abs): 0 10*3/uL (ref 0.0–0.1)
Immature Granulocytes: 0 %
Lymphocytes Absolute: 1.5 10*3/uL (ref 0.7–3.1)
Lymphs: 16 %
MCH: 30.7 pg (ref 26.6–33.0)
MCHC: 33.6 g/dL (ref 31.5–35.7)
MCV: 91 fL (ref 79–97)
Monocytes Absolute: 0.5 10*3/uL (ref 0.1–0.9)
Monocytes: 6 %
Neutrophils Absolute: 7.1 10*3/uL — ABNORMAL HIGH (ref 1.4–7.0)
Neutrophils: 76 %
Platelets: 364 10*3/uL (ref 150–450)
RBC: 5.05 x10E6/uL (ref 3.77–5.28)
RDW: 13.2 % (ref 11.7–15.4)
WBC: 9.4 10*3/uL (ref 3.4–10.8)

## 2020-12-18 LAB — COMPREHENSIVE METABOLIC PANEL
ALT: 11 IU/L (ref 0–32)
AST: 14 IU/L (ref 0–40)
Albumin/Globulin Ratio: 2.1 (ref 1.2–2.2)
Albumin: 4.4 g/dL (ref 3.8–4.8)
Alkaline Phosphatase: 98 IU/L (ref 44–121)
BUN/Creatinine Ratio: 18 (ref 9–23)
BUN: 11 mg/dL (ref 6–24)
Bilirubin Total: 0.3 mg/dL (ref 0.0–1.2)
CO2: 25 mmol/L (ref 20–29)
Calcium: 9.6 mg/dL (ref 8.7–10.2)
Chloride: 102 mmol/L (ref 96–106)
Creatinine, Ser: 0.61 mg/dL (ref 0.57–1.00)
GFR calc Af Amer: 123 mL/min/{1.73_m2} (ref >59)
GFR calc non Af Amer: 107 mL/min/{1.73_m2} (ref >59)
Globulin, Total: 2.1 g/dL (ref 1.5–4.5)
Glucose: 106 mg/dL — ABNORMAL HIGH (ref 65–99)
Potassium: 3.9 mmol/L (ref 3.5–5.2)
Sodium: 142 mmol/L (ref 134–144)
Total Protein: 6.5 g/dL (ref 6.0–8.5)

## 2020-12-18 LAB — LIPID PANEL W/O CHOL/HDL RATIO
Cholesterol, Total: 220 mg/dL — ABNORMAL HIGH (ref 100–199)
HDL: 40 mg/dL (ref >39)
LDL Chol Calc (NIH): 133 mg/dL — ABNORMAL HIGH (ref 0–99)
Triglycerides: 265 mg/dL — ABNORMAL HIGH (ref 0–149)
VLDL Cholesterol Cal: 47 mg/dL — ABNORMAL HIGH (ref 5–40)

## 2020-12-18 LAB — TSH: TSH: 0.677 u[IU]/mL (ref 0.450–4.500)

## 2020-12-18 NOTE — Progress Notes (Signed)
CBC within normal limits.  CMP elevated glucose 106 otherwise within normal limits. Please add on Hemoglobin A1C.  TSH okay.  Total cholesterol, triglycerides and LDL elevated.  Discuss lifestyle modification with patient e.g. increase exercise, fiber, fruits, vegetables, lean meat, and omega 3/fish intake and decrease saturated fat.  If patient following strict diet and exercise program already please schedule follow up appointment with primary care physician

## 2020-12-20 LAB — HEMOGLOBIN A1C
Est. average glucose Bld gHb Est-mCnc: 108 mg/dL
Hgb A1c MFr Bld: 5.4 % (ref 4.8–5.6)

## 2020-12-20 LAB — SPECIMEN STATUS REPORT

## 2020-12-20 NOTE — Progress Notes (Signed)
Hemoglobin A1C is within normal.  CBC within normal limits.  CMP elevated glucose 106 otherwise within normal limits. Please add on Hemoglobin A1C.  TSH okay.  Total cholesterol, triglycerides and LDL elevated. Discuss lifestyle modification with patient e.g. increase exercise, fiber, fruits, vegetables, lean meat, and omega 3/fish intake and decrease saturated fat. If patient following strict diet and exercise program already please schedule follow up appointment with primary care physician

## 2020-12-31 ENCOUNTER — Encounter: Payer: Self-pay | Admitting: Physician Assistant

## 2020-12-31 ENCOUNTER — Other Ambulatory Visit: Payer: Self-pay

## 2020-12-31 ENCOUNTER — Ambulatory Visit (INDEPENDENT_AMBULATORY_CARE_PROVIDER_SITE_OTHER): Payer: 59 | Admitting: Physician Assistant

## 2020-12-31 VITALS — BP 156/97 | HR 62 | Temp 98.4°F | Resp 16 | Ht 67.0 in | Wt 218.0 lb

## 2020-12-31 DIAGNOSIS — G4700 Insomnia, unspecified: Secondary | ICD-10-CM

## 2020-12-31 DIAGNOSIS — I10 Essential (primary) hypertension: Secondary | ICD-10-CM | POA: Diagnosis not present

## 2020-12-31 DIAGNOSIS — D751 Secondary polycythemia: Secondary | ICD-10-CM

## 2020-12-31 DIAGNOSIS — F32A Depression, unspecified: Secondary | ICD-10-CM | POA: Diagnosis not present

## 2020-12-31 MED ORDER — BUSPIRONE HCL 7.5 MG PO TABS: 7.5000 mg | ORAL_TABLET | Freq: Two times a day (BID) | ORAL | 0 refills | Status: AC

## 2020-12-31 MED ORDER — TRAZODONE HCL 50 MG PO TABS: 25.0000 mg | ORAL_TABLET | Freq: Every evening | ORAL | 3 refills | Status: DC | PRN

## 2020-12-31 MED ORDER — LOSARTAN POTASSIUM-HCTZ 50-12.5 MG PO TABS: 1.0000 | ORAL_TABLET | Freq: Every day | ORAL | 0 refills | Status: DC

## 2020-12-31 MED ORDER — HYDROXYZINE HCL 10 MG PO TABS: 10.0000 mg | ORAL_TABLET | Freq: Three times a day (TID) | ORAL | 0 refills | Status: DC | PRN

## 2020-12-31 NOTE — Patient Instructions (Signed)

## 2020-12-31 NOTE — Progress Notes (Signed)
Established patient visit   [name]   DOB: 11/09/1971   50 y.o. Female  MRN: [id number] Visit Date: 12/31/2020  Today's healthcare provider: Trinna Post, PA-C   Chief Complaint  Patient presents with  . Hypertension   Subjective    HPI   She is still grieving the loss of her fiance suddenly in December 2021. She is now caring for her autistic nephew. She has received paperwork from counseling and is intending to schedule with them.   Depression, Follow-up  She  was last seen for this 1 months ago. Changes made at last visit include referral for counseling. Reports she is having difficulty    She reports good compliance with treatment. She reports she has a poor reaction to Paxil. She is having history of insomnia. She intends to do counseling sessions.    Depression screen [facility] 2/9 12/31/2020 12/31/2016  Decreased Interest 1 0  Down, Depressed, Hopeless 1 1  PHQ - 2 Score 2 1  Altered sleeping 3 -  Tired, decreased energy 3 -  Change in appetite 3 -  Feeling bad or failure about yourself  0 -  Trouble concentrating 3 -  Moving slowly or fidgety/restless 0 -  Suicidal thoughts 0 -  PHQ-9 Score 14 -  Difficult doing work/chores Somewhat difficult -    -----------------------------------------------------------------------------------------   Hypertension, follow-up  BP Readings from Last 3 Encounters:  12/31/20 (!) 156/97  12/17/20 (!) 188/133  12/08/19 (!) 156/96   Wt Readings from Last 3 Encounters:  12/31/20 218 lb (98.9 kg)  12/17/20 224 lb 6.4 oz (101.8 kg)  12/08/19 230 lb 9.6 oz (104.6 kg)     She was last seen for hypertension 2 weeks ago.  BP at that visit was 188/133. Management since that visit includes starting back on bisoprolol and HCTZ.  She reports good compliance with treatment. She is not having side effects.  She is following a Regular diet. She is not exercising. She does smoke.  Use of agents associated with  hypertension: none.   Outside blood pressures are not being checked. Symptoms: No chest pain No chest pressure  No palpitations No syncope  No dyspnea No orthopnea  No paroxysmal nocturnal dyspnea No lower extremity edema   Pertinent labs: Lab Results  Component Value Date   CHOL 220 (H) 12/17/2020   HDL 40 12/17/2020   LDLCALC 133 (H) 12/17/2020   TRIG 265 (H) 12/17/2020   CHOLHDL 4.2 03/20/2016   Lab Results  Component Value Date   NA 142 12/17/2020   K 3.9 12/17/2020   CREATININE 0.61 12/17/2020   GFRNONAA 107 12/17/2020   GFRAA 123 12/17/2020   GLUCOSE 106 (H) 12/17/2020     The 10-year ASCVD risk score Mikey Bussing DC Jr., et al., 2013) is: 11.3%   Wt Readings from Last 3 Encounters:  12/31/20 218 lb (98.9 kg)  12/17/20 224 lb 6.4 oz (101.8 kg)  12/08/19 230 lb 9.6 oz (104.6 kg)       Medications: Outpatient Medications Prior to Visit  Medication Sig  . bisoprolol (ZEBETA) 10 MG tablet TAKE 1 TABLET(10 MG) BY MOUTH DAILY  . [DISCONTINUED] hydrochlorothiazide (HYDRODIURIL) 25 MG tablet Take 1 tablet (25 mg total) by mouth daily.  . [DISCONTINUED] acetaminophen (TYLENOL) 650 MG CR tablet Take 650 mg by mouth every 8 (eight) hours as needed for pain. (Patient not taking: No sig reported)  . [DISCONTINUED] Alpha-Lipoic Acid 600 MG CAPS Take 600 mg  by mouth. (Patient not taking: No sig reported)  . [DISCONTINUED] aspirin 81 MG tablet Take by mouth. (Patient not taking: No sig reported)  . [DISCONTINUED] Cyanocobalamin (B-12) 3000 MCG SUBL Place under the tongue. (Patient not taking: No sig reported)  . [DISCONTINUED] Omega-3 Fatty Acids (FISH OIL EXTRA STRENGTH) 435 MG CAPS Take by mouth. (Patient not taking: No sig reported)   No facility-administered medications prior to visit.    Review of Systems  Constitutional: Positive for activity change, appetite change and fatigue.  Respiratory: Negative.   Cardiovascular: Negative for chest pain, palpitations and leg  swelling.  Musculoskeletal: Negative.   Neurological: Negative for dizziness, weakness and headaches.  Psychiatric/Behavioral: Positive for decreased concentration and sleep disturbance. Negative for agitation. The patient is nervous/anxious.        Objective    BP (!) 156/97   Pulse 62   Temp 98.4 F (36.9 C)   Resp 16   Ht 5\' 7"  (1.702 m)   Wt 218 lb (98.9 kg)   BMI 34.14 kg/m     Physical Exam Constitutional:      Appearance: Normal appearance.  Cardiovascular:     Rate and Rhythm: Normal rate and regular rhythm.     Heart sounds: Normal heart sounds.  Pulmonary:     Effort: Pulmonary effort is normal.     Breath sounds: Normal breath sounds.  Skin:    General: Skin is warm and dry.  Neurological:     Mental Status: She is alert and oriented to person, place, and time. Mental status is at baseline.  Psychiatric:        Mood and Affect: Mood is depressed. Affect is tearful.        Behavior: Behavior normal.       No results found for any visits on 12/31/20.  Assessment & Plan    1. Essential hypertension  Start losartan in addition to other medications as below.  - losartan-hydrochlorothiazide (HYZAAR) 50-12.5 MG tablet; Take 1 tablet by mouth daily.  Dispense: 90 tablet; Refill: 0  2. Depression, unspecified depression type  Start as below. She is established with support group and has been referred to individual counseling.   - busPIRone (BUSPAR) 7.5 MG tablet; Take 1 tablet (7.5 mg total) by mouth 2 (two) times daily.  Dispense: 180 tablet; Refill: 0 - hydrOXYzine (ATARAX/VISTARIL) 10 MG tablet; Take 1 tablet (10 mg total) by mouth 3 (three) times daily as needed.  Dispense: 30 tablet; Refill: 0  3. Insomnia, unspecified type  - traZODone (DESYREL) 50 MG tablet; Take 0.5-1 tablets (25-50 mg total) by mouth at bedtime as needed for sleep.  Dispense: 30 tablet; Refill: 3  4. Erythrocytosis  History of this, most recent blood counts normal.   Return in  about 4 weeks (around 01/28/2021) for HTN .      ITrinna Post, PA-C, have reviewed all documentation for this visit. The documentation on 01/01/21 for the exam, diagnosis, procedures, and orders are all accurate and complete.  The entirety of the information documented in the History of Present Illness, Review of Systems and Physical Exam were personally obtained by me. Portions of this information were initially documented by Wilburt Finlay, CMA and reviewed by me for thoroughness and accuracy.     Paulene Floor  [facility] [phone] (phone) [phone] (fax)  [place]

## 2021-01-04 ENCOUNTER — Other Ambulatory Visit: Payer: Self-pay | Admitting: Adult Health

## 2021-01-04 DIAGNOSIS — I10 Essential (primary) hypertension: Secondary | ICD-10-CM

## 2021-01-13 ENCOUNTER — Other Ambulatory Visit: Payer: Self-pay | Admitting: Physician Assistant

## 2021-01-13 DIAGNOSIS — I10 Essential (primary) hypertension: Secondary | ICD-10-CM

## 2021-02-03 ENCOUNTER — Telehealth (INDEPENDENT_AMBULATORY_CARE_PROVIDER_SITE_OTHER): Payer: 59 | Admitting: Psychiatry

## 2021-02-03 ENCOUNTER — Other Ambulatory Visit: Payer: Self-pay

## 2021-02-03 ENCOUNTER — Encounter: Payer: Self-pay | Admitting: Psychiatry

## 2021-02-03 DIAGNOSIS — F4310 Post-traumatic stress disorder, unspecified: Secondary | ICD-10-CM | POA: Diagnosis not present

## 2021-02-03 DIAGNOSIS — Z634 Disappearance and death of family member: Secondary | ICD-10-CM | POA: Diagnosis not present

## 2021-02-03 MED ORDER — MIRTAZAPINE 7.5 MG PO TABS: 7.5000 mg | ORAL_TABLET | Freq: Every day | ORAL | 1 refills | Status: DC

## 2021-02-03 NOTE — Progress Notes (Signed)
Virtual Visit via Video Note  I connected with Michelle Simpson on 02/03/21 at  3:00 PM EDT by a video enabled telemedicine application and verified that I am speaking with the correct person using two identifiers.  Location Provider Location : [facility] Patient Location : Home  Participants: Patient , Provider   I discussed the limitations of evaluation and management by telemedicine and the availability of in person appointments. The patient expressed understanding and agreed to proceed.   I discussed the assessment and treatment plan with the patient. The patient was provided an opportunity to ask questions and all were answered. The patient agreed with the plan and demonstrated an understanding of the instructions.   The patient was advised to call back or seek an in-person evaluation if the symptoms worsen or if the condition fails to improve as anticipated.   Psychiatric Initial Adult Assessment   Patient Identification: Michelle Simpson MRN:  [id number] Date of Evaluation:  02/03/2021 Referral Source: Laverna Peace FNP Chief Complaint:   Chief Complaint    Establish Care; Depression; Anxiety     Visit Diagnosis:    ICD-10-CM   1. PTSD (post-traumatic stress disorder)  F43.10 mirtazapine (REMERON) 7.5 MG tablet  2. Bereavement  Z63.4     History of Present Illness:  Michelle Simpson is a 50 year old Caucasian female, employed, divorced, lives in [place], has a history of sadness, sleep problems, multiple medical problems including high blood pressure, IBS, osteoarthritis, pernicious anemia was evaluated by telemedicine today.  Patient today reports that her boyfriend and best friend of more than 20 years passed away suddenly in 11-23-20.  Patient reports she had to do CPR on him however that did not help.  Patient reports her dad passed away at the age of 60 and at that time also she had to do CPR on her dad which was not successful.  She reports she struggled with traumatic  memories of her dad's death for a very long time and has been in and out of therapy.  She reports her boyfriend's death brought back a lot of those memories.  She currently struggles with intrusive memories, flashbacks, nightmares, mood symptoms, concentration problems, crying spells, on a regular basis.  She reports she also has panic attacks.  She reports she goes into episodes where she has heart palpitations, hyperventilates and just wants to run and escape .  When she gets like this she takes her hydroxyzine which was prescribed to her by her primary care provider recently which helps to some extent.  Taking the hydroxyzine helps her to calm down in 15 to 20 minutes or so.  She reports her anxiety attacks are usually triggered by any kind of confrontation.  She currently takes care off her late boyfriend's nephew who struggles with autism spectrum.  She reports confrontation with him can sometimes trigger her anxiety attacks.  Patient reports she is currently grieving the loss of her late boyfriend.  She often has crying spells, reduced appetite.  She reports she also has IBS and after the death of her boyfriend it was triggered and she hence has lost some weight.  She however reports she is currently trying to eat more and it is getting slightly better.  She continues to follow-up with her primary care provider for the same.  Patient reports she has not been sleeping well.  The nightmares keeps her up at night.  She also has to help her late boyfriend's nephew Michelle Simpson, who may need her  assistance at night.  She is currently prescribed trazodone however she does not like it since it can make her groggy.  She hence has been noncompliant.  Patient denies any suicidality, homicidality or perceptual disturbances.  Patient denies any substance abuse problems.  Patient reports good support system from her family.  She has a biological daughter and another daughter that she raised .  She also is part of  support groups for grief-grief share group.  Patient reports she recently relocated after the death of her late boyfriend.  That also was stressful.  She however is currently trying to settle down.     Associated Signs/Symptoms: Depression Symptoms:  depressed mood, insomnia, difficulty concentrating, anxiety, panic attacks, loss of energy/fatigue, decreased appetite, (Hypo) Manic Symptoms:  Denies Anxiety Symptoms:  Excessive Worry, Psychotic Symptoms:  Denies PTSD Symptoms: Had a traumatic exposure:  as noted above Re-experiencing:  Flashbacks Intrusive Thoughts Nightmares Hypervigilance:  No Hyperarousal:  Difficulty Concentrating Emotional Numbness/Detachment Irritability/Anger Sleep Avoidance:  Decreased Interest/Participation Foreshortened Future  Past Psychiatric History: Patient does report a history of anxiety disorder. She reports she was treated for anxiety 1994 for panic attacks in 2003.  She denies inpatient mental health admissions.  Denies suicide attempts.  She has been in therapy in the past however currently denies having a therapist.  Previous Psychotropic Medications: Yes SSRIs makes her sick.  She tried Paxil in the past which caused her to hallucinate.  Substance Abuse History in the last 12 months:  No.  Consequences of Substance Abuse: Negative  Past Medical History:  Past Medical History:  Diagnosis Date  . B12 deficiency   . Degenerative disc disease, lumbar   . Depression   . DJD (degenerative joint disease)   . Eczema   . Edema   . Endometriosis   . High mean corpuscular hemoglobin concentration (MCHC)   . History of DVT (deep vein thrombosis)   . Hypertension   . IBS (irritable bowel syndrome)   . Osteoarthritis of right hip   . Panic attack   . Pernicious anemia     Past Surgical History:  Procedure Laterality Date  . ABDOMINAL HYSTERECTOMY    . ANKLE SURGERY Right   . ANKLE SURGERY Right    Simpson placed  . Bilateral knee  RFA nerve ablation @ [facility] hospitals    . CHOLECYSTECTOMY    . KNEE SURGERY Left 2012  . LACRIMAL DUCT RECONSTRUCTION    . LAPAROSCOPY    . miscarriage D&C  1995  . TYMPANOSTOMY TUBE PLACEMENT  1977    Family Psychiatric History: Mother-anxiety disorder  Family History:  Family History  Problem Relation Age of Onset  . Hypertension Father   . Hypertension Mother   . Deep vein thrombosis Mother   . Narcolepsy Mother   . Anxiety disorder Mother   . Hypertension Maternal Grandmother   . Hypertension Paternal Grandmother   . Hypertension Brother   . Heart attack Brother   . Liver disease Brother     Social History:   Social History   Socioeconomic History  . Marital status: Divorced    Spouse name: Not on file  . Number of children: 1  . Years of education: Not on file  . Highest education level: Not on file  Occupational History  . Not on file  Tobacco Use  . Smoking status: Current Every Day Smoker    Packs/day: 1.50    Years: 20.00    Pack years: 30.00    Types:  Cigarettes    Last attempt to quit: 11/17/2015    Years since quitting: 5.2  . Smokeless tobacco: Never Used  . Tobacco comment: Worsened since death of boyfriend   Vaping Use  . Vaping Use: Former  . Start date: 11/17/2015  . Quit date: 01/07/2020  Substance and Sexual Activity  . Alcohol use: Not Currently    Alcohol/week: 0.0 standard drinks  . Drug use: No  . Sexual activity: Never  Other Topics Concern  . Not on file  Social History Narrative  . Not on file   Social Determinants of Health   Financial Resource Strain: Not on file  Food Insecurity: Not on file  Transportation Needs: Not on file  Physical Activity: Not on file  Stress: Not on file  Social Connections: Not on file    Additional Social History: Patient was born in [place].  Both her parents are currently deceased.  She graduated college-majored in Art gallery manager.  She currently works as an Glass blower/designer at [employer].   Patient is divorced.  Her boyfriend and best friend of more than 20 years passed away in 2020/11/11.  She has a biological daughter and another daughter whom she raised.  Her late boyfriend's nephew who has a disability lives with her and she cares for him.  Patient currently lives in [place].  She denies any legal problems.  She denies any history of sexual or physical trauma.  She however does have a history of emotional trauma as noted above from providing CPR to her father who passed away when she was 46 yr old and her late boyfriend who passed away late 02/10/2020.  Allergies:   Allergies  Allergen Reactions  . Atenolol Hives    Urticaria.  . Gabapentin Swelling  . Nortriptyline Swelling  . Amlodipine Swelling  . Ibuprofen Other (See Comments)    With prolonged use swelling, HTN and congestion.  . Latex Hives    Urticaria.  . Meloxicam     Other reaction(s): Other (See Comments) Upset stomach.  . Metoprolol Hives  . Nsaids Swelling  . Paxil Cr [Paroxetine Hcl Er] Other (See Comments)    hallucination    Metabolic Disorder Labs: Lab Results  Component Value Date   HGBA1C 5.4 12/17/2020   No results found for: PROLACTIN Lab Results  Component Value Date   CHOL 220 (H) 12/17/2020   TRIG 265 (H) 12/17/2020   HDL 40 12/17/2020   CHOLHDL 4.2 03/20/2016   LDLCALC 133 (H) 12/17/2020   LDLCALC 174 (H) 03/20/2016   Lab Results  Component Value Date   TSH 0.677 12/17/2020    Therapeutic Level Labs: No results found for: LITHIUM No results found for: CBMZ No results found for: VALPROATE  Current Medications: Current Outpatient Medications  Medication Sig Dispense Refill  . bisoprolol (ZEBETA) 10 MG tablet TAKE 1 TABLET(10 MG) BY MOUTH DAILY 30 tablet 0  . busPIRone (BUSPAR) 7.5 MG tablet Take 1 tablet (7.5 mg total) by mouth 2 (two) times daily. 180 tablet 0  . hydrOXYzine (ATARAX/VISTARIL) 10 MG tablet Take 1 tablet (10 mg total) by mouth 3 (three) times daily as  needed. 30 tablet 0  . losartan-hydrochlorothiazide (HYZAAR) 50-12.5 MG tablet Take 1 tablet by mouth daily. 90 tablet 0  . mirtazapine (REMERON) 7.5 MG tablet Take 1 tablet (7.5 mg total) by mouth at bedtime. 30 tablet 1   No current facility-administered medications for this visit.    Musculoskeletal: Strength & Muscle Tone: UTA Gait &  Station: UTA Patient leans: N/A  Psychiatric Specialty Exam: Review of Systems  Constitutional: Positive for appetite change and fatigue.  Psychiatric/Behavioral: Positive for decreased concentration, dysphoric mood and sleep disturbance. The patient is nervous/anxious.   All other systems reviewed and are negative.   There were no vitals taken for this visit.There is no height or weight on file to calculate BMI.  General Appearance: Casual  Eye Contact:  Fair  Speech:  Clear and Coherent  Volume:  Normal  Mood:  Anxious and Depressed  Affect:  Congruent  Thought Process:  Goal Directed and Descriptions of Associations: Intact  Orientation:  Full (Time, Place, and Person)  Thought Content:  Rumination  Suicidal Thoughts:  No  Homicidal Thoughts:  No  Memory:  Immediate;   Fair Recent;   Fair Remote;   Fair  Judgement:  Fair  Insight:  Fair  Psychomotor Activity:  Normal  Concentration:  Concentration: Fair and Attention Span: Fair  Recall:  [employer] of Knowledge:Fair  Language: Fair  Akathisia:  No  Handed:  Right  AIMS (if indicated): UTA  Assets:  Communication Skills Desire for Improvement Housing Social Support Talents/Skills Transportation Vocational/Educational  ADL's:  Intact  Cognition: WNL  Sleep:  Poor   Screenings: PHQ2-9   Flowsheet Row Video Visit from 02/03/2021 in [facility] Visit from 12/31/2020 in [facility] Visit from 12/31/2016 in [facility]  PHQ-2 Total Score 4 2 1   PHQ-9 Total Score 12 14 --    Flowsheet Row Video Visit from  02/03/2021 in [facility] No Risk      Assessment and Plan: Michelle Simpson is a 50 year old Caucasian female, employed, divorced, lives in [place], has a history of multiple medical problems as well as currently going through grief, sleep disorder was evaluated by telemedicine today.  Patient is biologically predisposed given her history of multiple deaths in the family, history of trauma.  Patient will benefit from the following plan. The patient demonstrates the following risk factors for suicide: Chronic risk factors for suicide include: psychiatric disorder of anxiety and medical illness IBS,pernicious anemia. Acute risk factors for suicide include: loss (financial, interpersonal, professional). Protective factors for this patient include: positive social support, positive therapeutic relationship, responsibility to others (children, family), coping skills, hope for the future and religious beliefs against suicide. Considering these factors, the overall suicide risk at this point appears to be low. Patient is appropriate for outpatient follow up.   Plan PTSD-unstable Will refer patient for psychotherapy sessions. Continue BuSpar 7.5 mg p.o. twice daily Start mirtazapine 7.5 mg p.o. nightly  Bereavement-unstable Continue grief support groups. Will refer for grief counseling   I have reviewed the following labs-TSH-dated 12/17/2020-0.67-within normal limits.   Follow-up in clinic in 2 to 3 weeks or sooner if needed.  I have spent atleast 50 minutes with patient today which includes the time spent for preparing to see the patient ( e.g., review of test, records ), obtaining and to review and separately obtained history , ordering medications and test ,psychoeducation and supportive psychotherapy and care coordination,as well as documenting clinical information in electronic health record,interpreting and communication of test results This  note was generated in part or whole with voice recognition software. Voice recognition is usually quite accurate but there are transcription errors that can and very often do occur. I apologize for any typographical errors that were not detected and corrected.  Ursula Alert, MD 3/22/20229:31 AM

## 2021-02-03 NOTE — Patient Instructions (Signed)
Mirtazapine tablets What is this medicine? MIRTAZAPINE (mir TAZ a peen) is used to treat depression. This medicine may be used for other purposes; ask your health care provider or pharmacist if you have questions. COMMON BRAND NAME(S): Remeron What should I tell my health care provider before I take this medicine? They need to know if you have any of these conditions:  bipolar disorder  glaucoma  kidney disease  liver disease  suicidal thoughts  an unusual or allergic reaction to mirtazapine, other medicines, foods, dyes, or preservatives  pregnant or trying to get pregnant  breast-feeding How should I use this medicine? Take this medicine by mouth with a glass of water. Follow the directions on the prescription label. Take your medicine at regular intervals. Do not take your medicine more often than directed. Do not stop taking this medicine suddenly except upon the advice of your doctor. Stopping this medicine too quickly may cause serious side effects or your condition may worsen. A special MedGuide will be given to you by the pharmacist with each prescription and refill. Be sure to read this information carefully each time. Talk to your pediatrician regarding the use of this medicine in children. Special care may be needed. Overdosage: If you think you have taken too much of this medicine contact a poison control center or emergency room at once. NOTE: This medicine is only for you. Do not share this medicine with others. What if I miss a dose? If you miss a dose, take it as soon as you can. If it is almost time for your next dose, take only that dose. Do not take double or extra doses. What may interact with this medicine? Do not take this medicine with any of the following medications:  linezolid  MAOIs like Carbex, Eldepryl, Marplan, Nardil, and Parnate  methylene blue (injected into a vein) This medicine may also interact with the following  medications:  alcohol  antiviral medicines for HIV or AIDS  certain medicines that treat or prevent blood clots like warfarin  certain medicines for depression, anxiety, or psychotic disturbances  certain medicines for fungal infections like ketoconazole and itraconazole  certain medicines for migraine headache like almotriptan, eletriptan, frovatriptan, naratriptan, rizatriptan, sumatriptan, zolmitriptan  certain medicines for seizures like carbamazepine or phenytoin  certain medicines for sleep  cimetidine  erythromycin  fentanyl  lithium  medicines for blood pressure  nefazodone  rasagiline  rifampin  supplements like St. John's wort, kava kava, valerian  tramadol  tryptophan This list may not describe all possible interactions. Give your health care provider a list of all the medicines, herbs, non-prescription drugs, or dietary supplements you use. Also tell them if you smoke, drink alcohol, or use illegal drugs. Some items may interact with your medicine. What should I watch for while using this medicine? Tell your doctor if your symptoms do not get better or if they get worse. Visit your doctor or health care professional for regular checks on your progress. Because it may take several weeks to see the full effects of this medicine, it is important to continue your treatment as prescribed by your doctor. Patients and their families should watch out for new or worsening thoughts of suicide or depression. Also watch out for sudden changes in feelings such as feeling anxious, agitated, panicky, irritable, hostile, aggressive, impulsive, severely restless, overly excited and hyperactive, or not being able to sleep. If this happens, especially at the beginning of treatment or after a change in dose, call your health   care professional. You may get drowsy or dizzy. Do not drive, use machinery, or do anything that needs mental alertness until you know how this medicine  affects you. Do not stand or sit up quickly, especially if you are an older patient. This reduces the risk of dizzy or fainting spells. Alcohol may interfere with the effect of this medicine. Avoid alcoholic drinks. This medicine may cause dry eyes and blurred vision. If you wear contact lenses you may feel some discomfort. Lubricating drops may help. See your eye doctor if the problem does not go away or is severe. Your mouth may get dry. Chewing sugarless gum or sucking hard candy, and drinking plenty of water may help. Contact your doctor if the problem does not go away or is severe. What side effects may I notice from receiving this medicine? Side effects that you should report to your doctor or health care professional as soon as possible:  allergic reactions like skin rash, itching or hives, swelling of the face, lips, or tongue  anxious  changes in vision  chest pain  confusion  elevated mood, decreased need for sleep, racing thoughts, impulsive behavior  eye pain  fast, irregular heartbeat  feeling faint or lightheaded, falls  feeling agitated, angry, or irritable  fever or chills, sore throat  hallucination, loss of contact with reality  loss of balance or coordination  mouth sores  redness, blistering, peeling or loosening of the skin, including inside the mouth  restlessness, pacing, inability to keep still  seizures  stiff muscles  suicidal thoughts or other mood changes  trouble passing urine or change in the amount of urine  trouble sleeping  unusual bleeding or bruising  unusually weak or tired  vomiting Side effects that usually do not require medical attention (report to your doctor or health care professional if they continue or are bothersome):  change in appetite  constipation  dizziness  dry mouth  muscle aches or pains  nausea  tired  weight gain This list may not describe all possible side effects. Call your doctor for  medical advice about side effects. You may report side effects to FDA at 1-800-FDA-1088. Where should I keep my medicine? Keep out of the reach of children. Store at room temperature between 15 and 30 degrees C (59 and 86 degrees F) Protect from light and moisture. Throw away any unused medicine after the expiration date. NOTE: This sheet is a summary. It may not cover all possible information. If you have questions about this medicine, talk to your doctor, pharmacist, or health care provider.  2021 Elsevier/Gold Standard (2016-04-02 17:30:45)  

## 2021-02-11 ENCOUNTER — Other Ambulatory Visit: Payer: Self-pay

## 2021-02-11 ENCOUNTER — Ambulatory Visit: Payer: Self-pay | Admitting: Physician Assistant

## 2021-02-11 ENCOUNTER — Encounter: Payer: Self-pay | Admitting: Adult Health

## 2021-02-11 ENCOUNTER — Ambulatory Visit (INDEPENDENT_AMBULATORY_CARE_PROVIDER_SITE_OTHER): Payer: 59 | Admitting: Adult Health

## 2021-02-11 VITALS — BP 152/87 | HR 62 | Resp 15 | Wt 219.0 lb

## 2021-02-11 DIAGNOSIS — M62830 Muscle spasm of back: Secondary | ICD-10-CM | POA: Diagnosis not present

## 2021-02-11 DIAGNOSIS — I10 Essential (primary) hypertension: Secondary | ICD-10-CM

## 2021-02-11 MED ORDER — HYDROCHLOROTHIAZIDE 25 MG PO TABS: 25.0000 mg | ORAL_TABLET | Freq: Every day | ORAL | 0 refills | Status: DC

## 2021-02-11 MED ORDER — BACLOFEN 10 MG PO TABS: 10.0000 mg | ORAL_TABLET | Freq: Three times a day (TID) | ORAL | 0 refills | Status: DC

## 2021-02-11 MED ORDER — BISOPROLOL FUMARATE 10 MG PO TABS: 10.0000 mg | ORAL_TABLET | Freq: Every day | ORAL | 0 refills | Status: DC

## 2021-02-11 MED ORDER — PREDNISONE 10 MG (21) PO TBPK: ORAL_TABLET | ORAL | 0 refills | Status: DC

## 2021-02-11 MED ORDER — LOSARTAN POTASSIUM 50 MG PO TABS: 50.0000 mg | ORAL_TABLET | Freq: Every day | ORAL | 0 refills | Status: DC

## 2021-02-11 NOTE — Progress Notes (Signed)
Established patient visit   [name]   DOB: 07-07-1971   50 y.o. Female  MRN: [id number] Visit Date: 02/11/2021  Today's healthcare provider: Marcille Buffy, FNP   Chief Complaint  Patient presents with  . Hypertension   Subjective    HPI  Hypertension, follow-up  BP Readings from Last 3 Encounters:  02/11/21 (!) 152/87  12/31/20 (!) 156/97  12/17/20 (!) 188/133   Wt Readings from Last 3 Encounters:  02/11/21 219 lb (99.3 kg)  12/31/20 218 lb (98.9 kg)  12/17/20 224 lb 6.4 oz (101.8 kg)     She was last seen for hypertension 1 months ago.  BP at that visit was 156/97. Management since that visit includes started Losartan-HCTZ 50-12.5mg .  She reports good compliance with treatment. She is not having side effects.  She is following a Regular diet. She is not exercising. She does smoke.  Use of agents associated with hypertension: none.   Outside blood pressures are not being checked.  Patient  denies any fever,chills, rash, chest pain, shortness of breath, nausea, vomiting, or diarrhea.  Denies dizziness, lightheadedness, pre syncopal or syncopal episodes.   Has seen psychiatry since last visit, she is feeling on new medication regimen. She is sleeping well with Buspar.    She has been moving into a new place in the past week and is having lower back soreness. She declined  X ray of lumbar spine.  Denies sciatica.   Symptoms: No chest pain No chest pressure  No palpitations No syncope  No dyspnea No orthopnea  No paroxysmal nocturnal dyspnea Yes lower extremity edema   Pertinent labs: Lab Results  Component Value Date   CHOL 220 (H) 12/17/2020   HDL 40 12/17/2020   LDLCALC 133 (H) 12/17/2020   TRIG 265 (H) 12/17/2020   CHOLHDL 4.2 03/20/2016   Lab Results  Component Value Date   NA 142 12/17/2020   K 3.9 12/17/2020   CREATININE 0.61 12/17/2020   GFRNONAA 107 12/17/2020   GFRAA 123 12/17/2020   GLUCOSE 106 (H) 12/17/2020      The 10-year ASCVD risk score Mikey Bussing DC Jr., et al., 2013) is: 11%   ---------------------------------------------------------------------------------------------------  Patient Active Problem List   Diagnosis Date Noted  . Muscle spasm of back 02/11/2021  . PTSD (post-traumatic stress disorder) 02/03/2021  . Bereavement 02/03/2021  . Spondylosis of lumbar region without myelopathy or radiculopathy 07/15/2016  . Irritable bowel syndrome 04/29/2016  . 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 Chronic pelvic pain in female 03/20/2016  . Allergic rhinitis 03/20/2016  . Hyperlipidemia 03/20/2016  . Osteoarthritis of hip 03/20/2016  . Heterozygous factor V Leiden mutation ([place]) 03/20/2016  . History of deep venous thrombosis 03/20/2016  . Primary osteoarthritis of both knees 10/23/2014  . DDD (degenerative disc disease), lumbar 10/04/2014  . Chondromalacia of knee, 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  . Neuritis or radiculitis due to rupture of lumbar intervertebral disc 09/12/2014  . Lumbar radiculitis 09/12/2014  . Edema of lower extremity 09/05/2014  . Bilateral leg edema 09/05/2014  . Hip impingement syndrome 06/19/2014  . Endometriosis 05/06/2010  . Uncontrolled hypertension 03/11/2009   Past Medical History:  Diagnosis Date  . B12 deficiency   . Degenerative disc disease, lumbar   . Depression   . DJD (degenerative joint disease)   . Eczema   . Edema   . Endometriosis   . High mean corpuscular hemoglobin concentration (MCHC)   . History of DVT (deep vein thrombosis)   .  Hypertension   . IBS (irritable bowel syndrome)   . Osteoarthritis of right hip   . Panic attack   . Pernicious anemia    Allergies  Allergen Reactions  . Atenolol Hives    Urticaria.  . Gabapentin Swelling  . Nortriptyline Swelling  . Amlodipine Swelling  . Ibuprofen Other (See Comments)    With prolonged use swelling, HTN and congestion.  . Latex Hives    Urticaria.  . Meloxicam     Other reaction(s):  Other (See Comments) Upset stomach.  . Metoprolol Hives  . Nsaids Swelling  . Paxil Cr [Paroxetine Hcl Er] Other (See Comments)    hallucination       Medications: Outpatient Medications Prior to Visit  Medication Sig  . busPIRone (BUSPAR) 7.5 MG tablet Take 1 tablet (7.5 mg total) by mouth 2 (two) times daily.  . hydrOXYzine (ATARAX/VISTARIL) 10 MG tablet Take 1 tablet (10 mg total) by mouth 3 (three) times daily as needed.  . mirtazapine (REMERON) 7.5 MG tablet Take 1 tablet (7.5 mg total) by mouth at bedtime.  . [DISCONTINUED] bisoprolol (ZEBETA) 10 MG tablet TAKE 1 TABLET(10 MG) BY MOUTH DAILY  . [DISCONTINUED] losartan-hydrochlorothiazide (HYZAAR) 50-12.5 MG tablet Take 1 tablet by mouth daily.   No facility-administered medications prior to visit.    Review of Systems  Constitutional: Positive for fatigue. Negative for activity change, appetite change, chills, diaphoresis, fever and unexpected weight change.  HENT: Negative.   Eyes: Negative.   Respiratory: Negative.   Cardiovascular: Negative.   Gastrointestinal: Negative.   Genitourinary: Negative.   Musculoskeletal: Positive for arthralgias and back pain.  Neurological: Negative.   Hematological: Negative.   Psychiatric/Behavioral: Negative for dysphoric mood. The patient is nervous/anxious.     Last CBC Lab Results  Component Value Date   WBC 9.4 12/17/2020   HGB 15.5 12/17/2020   HCT 46.1 12/17/2020   MCV 91 12/17/2020   MCH 30.7 12/17/2020   RDW 13.2 12/17/2020   PLT 364 67/89/3810   Last metabolic panel Lab Results  Component Value Date   GLUCOSE 106 (H) 12/17/2020   NA 142 12/17/2020   K 3.9 12/17/2020   CL 102 12/17/2020   CO2 25 12/17/2020   BUN 11 12/17/2020   CREATININE 0.61 12/17/2020   GFRNONAA 107 12/17/2020   GFRAA 123 12/17/2020   CALCIUM 9.6 12/17/2020   PROT 6.5 12/17/2020   ALBUMIN 4.4 12/17/2020   LABGLOB 2.1 12/17/2020   AGRATIO 2.1 12/17/2020   BILITOT 0.3 12/17/2020    ALKPHOS 98 12/17/2020   AST 14 12/17/2020   ALT 11 12/17/2020   ANIONGAP 9 10/20/2017   Last lipids Lab Results  Component Value Date   CHOL 220 (H) 12/17/2020   HDL 40 12/17/2020   LDLCALC 133 (H) 12/17/2020   TRIG 265 (H) 12/17/2020   CHOLHDL 4.2 03/20/2016   Last hemoglobin A1c Lab Results  Component Value Date   HGBA1C 5.4 12/17/2020   Last thyroid functions Lab Results  Component Value Date   TSH 0.677 12/17/2020   Last vitamin D No results found for: Davy Pique, VD25OH Last vitamin B12 and Folate Lab Results  Component Value Date   VITAMINB12 1,388 (H) 03/20/2016       Objective    BP (!) 152/87   Pulse 62   Resp 15   Wt 219 lb (99.3 kg)   SpO2 94%   BMI 34.30 kg/m  BP Readings from Last 3 Encounters:  02/11/21 (!) 152/87  12/31/20 Marland Kitchen)  156/97  12/17/20 (!) 188/133   Wt Readings from Last 3 Encounters:  02/11/21 219 lb (99.3 kg)  12/31/20 218 lb (98.9 kg)  12/17/20 224 lb 6.4 oz (101.8 kg)     Vitals with BMI 02/11/2021 12/31/2020 12/17/2020  Height - 5\' 7"  -  Weight 219 lbs 218 lbs 224 lbs 6 oz  BMI - 38.25 -  Systolic 053 976 734  Diastolic 87 97 193  Pulse 62 62 93     Physical Exam Vitals reviewed.  Constitutional:      General: She is not in acute distress.    Appearance: She is well-developed. She is not diaphoretic.     Interventions: She is not intubated. HENT:     Head: Normocephalic and atraumatic.     Right Ear: External ear normal.     Left Ear: External ear normal.     Nose: Nose normal.     Mouth/Throat:     Pharynx: No oropharyngeal exudate.  Eyes:     General: Lids are normal. No scleral icterus.       Right eye: No discharge.        Left eye: No discharge.     Conjunctiva/sclera: Conjunctivae normal.     Right eye: Right conjunctiva is not injected. No exudate or hemorrhage.    Left eye: Left conjunctiva is not injected. No exudate or hemorrhage.    Pupils: Pupils are equal, round, and reactive to light.   Neck:     Thyroid: No thyroid mass or thyromegaly.     Vascular: Normal carotid pulses. No carotid bruit, hepatojugular reflux or JVD.     Trachea: Trachea and phonation normal. No tracheal tenderness or tracheal deviation.     Meningeal: Brudzinski's sign and Kernig's sign absent.  Cardiovascular:     Rate and Rhythm: Normal rate and regular rhythm.     Pulses: Normal pulses.          Radial pulses are 2+ on the right side and 2+ on the left side.       Dorsalis pedis pulses are 2+ on the right side and 2+ on the left side.       Posterior tibial pulses are 2+ on the right side and 2+ on the left side.     Heart sounds: Normal heart sounds, S1 normal and S2 normal. Heart sounds not distant. No murmur heard. No friction rub. No gallop.   Pulmonary:     Effort: Pulmonary effort is normal. No tachypnea, bradypnea, accessory muscle usage or respiratory distress. She is not intubated.     Breath sounds: Normal breath sounds. No stridor. No wheezing or rales.  Chest:     Chest wall: No tenderness.  Breasts:     Right: No supraclavicular adenopathy.     Left: No supraclavicular adenopathy.    Abdominal:     General: Bowel sounds are normal. There is no distension or abdominal bruit.     Palpations: Abdomen is soft. There is no shifting dullness, fluid wave, hepatomegaly, splenomegaly, mass or pulsatile mass.     Tenderness: There is no abdominal tenderness. There is no guarding or rebound.     Hernia: No hernia is present.  Musculoskeletal:        General: No deformity. Normal range of motion.     Cervical back: Normal, full passive range of motion without pain, normal range of motion and neck supple. No edema, erythema or rigidity. No spinous process tenderness or muscular tenderness. Normal  range of motion.     Thoracic back: Normal.     Lumbar back: Spasms and tenderness present.       Back:  Lymphadenopathy:     Head:     Right side of head: No submental, submandibular,  tonsillar, preauricular, posterior auricular or occipital adenopathy.     Left side of head: No submental, submandibular, tonsillar, preauricular, posterior auricular or occipital adenopathy.     Cervical: No cervical adenopathy.     Right cervical: No superficial, deep or posterior cervical adenopathy.    Left cervical: No superficial, deep or posterior cervical adenopathy.     Upper Body:     Right upper body: No supraclavicular or pectoral adenopathy.     Left upper body: No supraclavicular or pectoral adenopathy.  Skin:    General: Skin is warm and dry.     Coloration: Skin is not pale.     Findings: No abrasion, bruising, burn, ecchymosis, erythema, lesion, petechiae or rash.     Nails: There is no clubbing.  Neurological:     Mental Status: She is alert and oriented to person, place, and time.     GCS: GCS eye subscore is 4. GCS verbal subscore is 5. GCS motor subscore is 6.     Cranial Nerves: No cranial nerve deficit.     Sensory: No sensory deficit.     Motor: No tremor, atrophy, abnormal muscle tone or seizure activity.     Coordination: Coordination normal.     Gait: Gait normal.     Deep Tendon Reflexes: Reflexes are normal and symmetric. Reflexes normal. Babinski sign absent on the right side. Babinski sign absent on the left side.     Reflex Scores:      Tricep reflexes are 2+ on the right side and 2+ on the left side.      Bicep reflexes are 2+ on the right side and 2+ on the left side.      Brachioradialis reflexes are 2+ on the right side and 2+ on the left side.      Patellar reflexes are 2+ on the right side and 2+ on the left side.      Achilles reflexes are 2+ on the right side and 2+ on the left side. Psychiatric:        Speech: Speech normal.        Behavior: Behavior normal.        Thought Content: Thought content normal.        Judgment: Judgment normal.       No results found for any visits on 02/11/21.  Assessment & Plan     1. Uncontrolled  hypertension Blood pressure still not controlled. She has allergy to metoprolol and Atenolol and has not had any issues with Bisoprolol and she reports she has " been on it for years no issues".  - bisoprolol (ZEBETA) 10 MG tablet; Take 1 tablet (10 mg total) by mouth daily.  Dispense: 90 tablet; Refill: 0 - losartan (COZAAR) 50 MG tablet; Take 1 tablet (50 mg total) by mouth daily.  Dispense: 90 tablet; Refill: 0 - hydrochlorothiazide (HYDRODIURIL) 25 MG tablet; Take 1 tablet (25 mg total) by mouth daily.  Dispense: 90 tablet; Refill: 0  Increasing HCTZ to 25 mg qd po from 12.5mg  po qd. - CBC with Differential/Platelet - Comprehensive Metabolic Panel (CMET) - US Renal Artery Stenosis  Monitor blood pressure and heart rate at home keep log and call or seek care if  abnormal.  2. Muscle spasm of back Declined lumbar x ray.  - predniSONE (STERAPRED UNI-PAK 21 TAB) 10 MG (21) TBPK tablet; PO: Take 6 tablets on day 1:Take 5 tablets day 2:Take 4 tablets day 3: Take 3 tablets day 4:Take 2 tablets day five: 5 Take 1 tablet day 6  Dispense: 21 tablet; Refill: 0 - baclofen (LIORESAL) 10 MG tablet; Take 1 tablet (10 mg total) by mouth 3 (three) times daily. Will cause drowsiness.  Dispense: 30 each; Refill: 0   Red Flags discussed. The patient was given clear instructions to go to ER or return to medical center if any red flags develop, symptoms do not improve, worsen or new problems develop. They verbalized understanding.  Return in about 3 months (around 05/14/2021), or if symptoms worsen or fail to improve, for at any time for any worsening symptoms, Go to Emergency room/ urgent care if worse.     The entirety of the information documented in the History of Present Illness, Review of Systems and Physical Exam were personally obtained by me. Portions of this information were initially documented by the CMA and reviewed by me for thoroughness and accuracy.     Marcille Buffy, [place] [phone] (phone) [phone] (fax)  [place]

## 2021-02-11 NOTE — Patient Instructions (Signed)
Recheck blood pressure in 2 months and also follow up to recheck for hypertension.  Hypertension, Adult Hypertension is another name for high blood pressure. High blood pressure forces your heart to work harder to pump blood. This can cause problems over time. There are two numbers in a blood pressure reading. There is a top number (systolic) over a bottom number (diastolic). It is best to have a blood pressure that is below 120/80. Healthy choices can help lower your blood pressure, or you may need medicine to help lower it. What are the causes? The cause of this condition is not known. Some conditions may be related to high blood pressure. What increases the risk?  Smoking.  Having type 2 diabetes mellitus, high cholesterol, or both.  Not getting enough exercise or physical activity.  Being overweight.  Having too much fat, sugar, calories, or salt (sodium) in your diet.  Drinking too much alcohol.  Having long-term (chronic) kidney disease.  Having a family history of high blood pressure.  Age. Risk increases with age.  Race. You may be at higher risk if you are African American.  Gender. Men are at higher risk than women before age 33. After age 15, women are at higher risk than men.  Having obstructive sleep apnea.  Stress. What are the signs or symptoms?  High blood pressure may not cause symptoms. Very high blood pressure (hypertensive crisis) may cause: ? Headache. ? Feelings of worry or nervousness (anxiety). ? Shortness of breath. ? Nosebleed. ? A feeling of being sick to your stomach (nausea). ? Throwing up (vomiting). ? Changes in how you see. ? Very bad chest pain. ? Seizures. How is this treated?  This condition is treated by making healthy lifestyle changes, such as: ? Eating healthy foods. ? Exercising more. ? Drinking less alcohol.  Your health care provider may prescribe medicine if lifestyle changes are not enough to get your blood pressure  under control, and if: ? Your top number is above 130. ? Your bottom number is above 80.  Your personal target blood pressure may vary. Follow these instructions at home: Eating and drinking  If told, follow the DASH eating plan. To follow this plan: ? Fill one half of your plate at each meal with fruits and vegetables. ? Fill one fourth of your plate at each meal with whole grains. Whole grains include whole-wheat pasta, brown rice, and whole-grain bread. ? Eat or drink low-fat dairy products, such as skim milk or low-fat yogurt. ? Fill one fourth of your plate at each meal with low-fat (lean) proteins. Low-fat proteins include fish, chicken without skin, eggs, beans, and tofu. ? Avoid fatty meat, cured and processed meat, or chicken with skin. ? Avoid pre-made or processed food.  Eat less than 1,500 mg of salt each day.  Do not drink alcohol if: ? Your doctor tells you not to drink. ? You are pregnant, may be pregnant, or are planning to become pregnant.  If you drink alcohol: ? Limit how much you use to:  0-1 drink a day for women.  0-2 drinks a day for men. ? Be aware of how much alcohol is in your drink. In the [place] one drink equals one 12 oz bottle of beer (355 mL), one 5 oz glass of wine (148 mL), or one 1 oz glass of hard liquor (44 mL).   Lifestyle  Work with your doctor to stay at a healthy weight or to lose weight. Ask your doctor what  the best weight is for you.  Get at least 30 minutes of exercise most days of the week. This may include walking, swimming, or biking.  Get at least 30 minutes of exercise that strengthens your muscles (resistance exercise) at least 3 days a week. This may include lifting weights or doing Pilates.  Do not use any products that contain nicotine or tobacco, such as cigarettes, e-cigarettes, and chewing tobacco. If you need help quitting, ask your doctor.  Check your blood pressure at home as told by your doctor.  Keep all follow-up  visits as told by your doctor. This is important.   Medicines  Take over-the-counter and prescription medicines only as told by your doctor. Follow directions carefully.  Do not skip doses of blood pressure medicine. The medicine does not work as well if you skip doses. Skipping doses also puts you at risk for problems.  Ask your doctor about side effects or reactions to medicines that you should watch for. Contact a doctor if you:  Think you are having a reaction to the medicine you are taking.  Have headaches that keep coming back (recurring).  Feel dizzy.  Have swelling in your ankles.  Have trouble with your vision. Get help right away if you:  Get a very bad headache.  Start to feel mixed up (confused).  Feel weak or numb.  Feel faint.  Have very bad pain in your: ? Chest. ? Belly (abdomen).  Throw up more than once.  Have trouble breathing. Summary  Hypertension is another name for high blood pressure.  High blood pressure forces your heart to work harder to pump blood.  For most people, a normal blood pressure is less than 120/80.  Making healthy choices can help lower blood pressure. If your blood pressure does not get lower with healthy choices, you may need to take medicine. This information is not intended to replace advice given to you by your health care provider. Make sure you discuss any questions you have with your health care provider. Document Revised: 07/13/2018 Document Reviewed: 07/13/2018 Elsevier Patient Education  2021 [place]. Muscle Cramps and Spasms Muscle cramps and spasms are when muscles tighten by themselves. They usually get better within minutes. Muscle cramps are painful. They are usually stronger and last longer than muscle spasms. Muscle spasms may or may not be painful. They can last a few seconds or much longer. Cramps and spasms can affect any muscle, but they occur most often in the calf muscles of the leg. They are  usually not caused by a serious problem. In many cases, the cause is not known. Some common causes include:  Doing more physical work or exercise than your body is ready for.  Using the muscles too much (overuse) by repeating certain movements too many times.  Staying in a certain position for a long time.  Playing a sport or doing an activity without preparing properly.  Using bad form or technique while playing a sport or doing an activity.  Not having enough water in your body (dehydration).  Injury.  Side effects of some medicines.  Low levels of the salts and minerals in your blood (electrolytes), such as low potassium or calcium. Follow these instructions at home: Managing pain and stiffness  Massage, stretch, and relax the muscle. Do this for many minutes at a time.  If told, put heat on tight or tense muscles as often as told by your doctor. Use the heat source that your doctor recommends,  such as a moist heat pack or a heating pad. ? Place a towel between your skin and the heat source. ? Leave the heat on for 20-30 minutes. ? Remove the heat if your skin turns bright red. This is very important if you are not able to feel pain, heat, or cold. You may have a greater risk of getting burned.  If told, put ice on the affected area. This may help if you are sore or have pain after a cramp or spasm. ? Put ice in a plastic bag. ? Place a towel between your skin and the bag. ? Leave the ice on for 20 minutes, 2-3 times a day.  Try taking hot showers or baths to help relax tight muscles.      Eating and drinking  Drink enough fluid to keep your pee (urine) pale yellow.  Eat a healthy diet to help ensure that your muscles work well. This should include: ? Fruits and vegetables. ? Lean protein. ? Whole grains. ? Low-fat or nonfat dairy products. General instructions  If you are having cramps often, avoid intense exercise for several days.  Take over-the-counter and  prescription medicines only as told by your doctor.  Watch for any changes in your symptoms.  Keep all follow-up visits as told by your doctor. This is important. Contact a doctor if:  Your cramps or spasms get worse or happen more often.  Your cramps or spasms do not get better with time. Summary  Muscle cramps and spasms are when muscles tighten by themselves. They usually get better within minutes.  Cramps and spasms occur most often in the calf muscles of the leg.  Massage, stretch, and relax the muscle. This may help the cramp or spasm go away.  Drink enough fluid to keep your pee (urine) pale yellow. This information is not intended to replace advice given to you by your health care provider. Make sure you discuss any questions you have with your health care provider. Document Revised: 03/28/2018 Document Reviewed: 03/28/2018 Elsevier Patient Education  [place].

## 2021-02-24 ENCOUNTER — Telehealth: Payer: Self-pay | Admitting: Adult Health

## 2021-02-24 DIAGNOSIS — I10 Essential (primary) hypertension: Secondary | ICD-10-CM

## 2021-02-24 NOTE — Telephone Encounter (Signed)
Discontinue her Baclofen- muscle relaxer She can decrease Losartan to half tablet.  Increase hydration.  She needs an office visit, ok to place referral to cardiology for heart rate evaluation. \ Go to the ED / UC is advised if symptoms worsening at any time or if persist at any time. Advised to go NOW if symptoms severe.

## 2021-02-24 NOTE — Telephone Encounter (Signed)
Spoke with patient and advised as below, scheduled appt tomorrow at 3 to discuss further plan with you, will place in referral for cardiology today. KW

## 2021-02-24 NOTE — Telephone Encounter (Signed)
Pt saw Sharyn Lull 3/29 and had low heart rate. Pt was to report to [facility] this week.  Pt states her heart rate is about 52. It has dropped below 50. Pt states she is worn out, exhausted, sleeping all the time. She feels like Sharyn Lull still may need to switch some meds around.

## 2021-02-25 ENCOUNTER — Ambulatory Visit (INDEPENDENT_AMBULATORY_CARE_PROVIDER_SITE_OTHER): Payer: 59 | Admitting: Adult Health

## 2021-02-25 ENCOUNTER — Other Ambulatory Visit: Payer: Self-pay

## 2021-02-25 ENCOUNTER — Encounter: Payer: Self-pay | Admitting: Adult Health

## 2021-02-25 VITALS — BP 119/76 | HR 61 | Resp 16 | Wt 215.6 lb

## 2021-02-25 DIAGNOSIS — R001 Bradycardia, unspecified: Secondary | ICD-10-CM

## 2021-02-25 DIAGNOSIS — R9431 Abnormal electrocardiogram [ECG] [EKG]: Secondary | ICD-10-CM

## 2021-02-25 DIAGNOSIS — I10 Essential (primary) hypertension: Secondary | ICD-10-CM | POA: Diagnosis not present

## 2021-02-25 MED ORDER — LOSARTAN POTASSIUM 50 MG PO TABS: 25.0000 mg | ORAL_TABLET | Freq: Every day | ORAL | 0 refills | Status: DC

## 2021-02-25 MED ORDER — BISOPROLOL FUMARATE 5 MG PO TABS: 5.0000 mg | ORAL_TABLET | Freq: Every day | ORAL | 0 refills | Status: DC

## 2021-02-25 NOTE — Patient Instructions (Signed)
Meds ordered this encounter  Medications  . losartan (COZAAR) 50 MG tablet    Sig: Take 0.5 tablets (25 mg total) by mouth daily.    Dispense:  90 tablet    Refill:  0  . bisoprolol (ZEBETA) 5 MG tablet    Sig: Take 1 tablet (5 mg total) by mouth daily.    Dispense:  90 tablet    Refill:  0    Hypertension, Adult Hypertension is another name for high blood pressure. High blood pressure forces your heart to work harder to pump blood. This can cause problems over time. There are two numbers in a blood pressure reading. There is a top number (systolic) over a bottom number (diastolic). It is best to have a blood pressure that is below 120/80. Healthy choices can help lower your blood pressure, or you may need medicine to help lower it. What are the causes? The cause of this condition is not known. Some conditions may be related to high blood pressure. What increases the risk?  Smoking.  Having type 2 diabetes mellitus, high cholesterol, or both.  Not getting enough exercise or physical activity.  Being overweight.  Having too much fat, sugar, calories, or salt (sodium) in your diet.  Drinking too much alcohol.  Having long-term (chronic) kidney disease.  Having a family history of high blood pressure.  Age. Risk increases with age.  Race. You may be at higher risk if you are African American.  Gender. Men are at higher risk than women before age 72. After age 90, women are at higher risk than men.  Having obstructive sleep apnea.  Stress. What are the signs or symptoms?  High blood pressure may not cause symptoms. Very high blood pressure (hypertensive crisis) may cause: ? Headache. ? Feelings of worry or nervousness (anxiety). ? Shortness of breath. ? Nosebleed. ? A feeling of being sick to your stomach (nausea). ? Throwing up (vomiting). ? Changes in how you see. ? Very bad chest pain. ? Seizures. How is this treated?  This condition is treated by making  healthy lifestyle changes, such as: ? Eating healthy foods. ? Exercising more. ? Drinking less alcohol.  Your health care provider may prescribe medicine if lifestyle changes are not enough to get your blood pressure under control, and if: ? Your top number is above 130. ? Your bottom number is above 80.  Your personal target blood pressure may vary. Follow these instructions at home: Eating and drinking  If told, follow the DASH eating plan. To follow this plan: ? Fill one half of your plate at each meal with fruits and vegetables. ? Fill one fourth of your plate at each meal with whole grains. Whole grains include whole-wheat pasta, brown rice, and whole-grain bread. ? Eat or drink low-fat dairy products, such as skim milk or low-fat yogurt. ? Fill one fourth of your plate at each meal with low-fat (lean) proteins. Low-fat proteins include fish, chicken without skin, eggs, beans, and tofu. ? Avoid fatty meat, cured and processed meat, or chicken with skin. ? Avoid pre-made or processed food.  Eat less than 1,500 mg of salt each day.  Do not drink alcohol if: ? Your doctor tells you not to drink. ? You are pregnant, may be pregnant, or are planning to become pregnant.  If you drink alcohol: ? Limit how much you use to:  0-1 drink a day for women.  0-2 drinks a day for men. ? Be aware of how  much alcohol is in your drink. In the [place] one drink equals one 12 oz bottle of beer (355 mL), one 5 oz glass of wine (148 mL), or one 1 oz glass of hard liquor (44 mL).   Lifestyle  Work with your doctor to stay at a healthy weight or to lose weight. Ask your doctor what the best weight is for you.  Get at least 30 minutes of exercise most days of the week. This may include walking, swimming, or biking.  Get at least 30 minutes of exercise that strengthens your muscles (resistance exercise) at least 3 days a week. This may include lifting weights or doing Pilates.  Do not use any  products that contain nicotine or tobacco, such as cigarettes, e-cigarettes, and chewing tobacco. If you need help quitting, ask your doctor.  Check your blood pressure at home as told by your doctor.  Keep all follow-up visits as told by your doctor. This is important.   Medicines  Take over-the-counter and prescription medicines only as told by your doctor. Follow directions carefully.  Do not skip doses of blood pressure medicine. The medicine does not work as well if you skip doses. Skipping doses also puts you at risk for problems.  Ask your doctor about side effects or reactions to medicines that you should watch for. Contact a doctor if you:  Think you are having a reaction to the medicine you are taking.  Have headaches that keep coming back (recurring).  Feel dizzy.  Have swelling in your ankles.  Have trouble with your vision. Get help right away if you:  Get a very bad headache.  Start to feel mixed up (confused).  Feel weak or numb.  Feel faint.  Have very bad pain in your: ? Chest. ? Belly (abdomen).  Throw up more than once.  Have trouble breathing. Summary  Hypertension is another name for high blood pressure.  High blood pressure forces your heart to work harder to pump blood.  For most people, a normal blood pressure is less than 120/80.  Making healthy choices can help lower blood pressure. If your blood pressure does not get lower with healthy choices, you may need to take medicine. This information is not intended to replace advice given to you by your health care provider. Make sure you discuss any questions you have with your health care provider. Document Revised: 07/13/2018 Document Reviewed: 07/13/2018 Elsevier Patient Education  2021 [employer].

## 2021-02-25 NOTE — Progress Notes (Signed)
Established patient visit   [name]   DOB: 18-Jun-1971   49 y.o. Female  MRN: [id number] Visit Date: 02/25/2021  Today's healthcare provider: Marcille Buffy, FNP   Chief Complaint  Patient presents with  . Hypertension   Subjective    HPI  Hypertension, follow-up  BP Readings from Last 3 Encounters:  02/25/21 119/76  02/11/21 (!) 152/87  12/31/20 (!) 156/97   Wt Readings from Last 3 Encounters:  02/25/21 215 lb 9.6 oz (97.8 kg)  02/11/21 219 lb (99.3 kg)  12/31/20 218 lb (98.9 kg)     She was last seen for hypertension 2 weeks ago.  BP at that visit was 152/87. Management since that visit includes increased HCTZ from 12.5mg  to 25mg  . On 02/24/21 patient reported HR below 50 and was instructed to decrease losartan to half tablet.   She reports excellent compliance with treatment. She is not having side effects.  She is following a Regular diet. She is not exercising. She does not smoke.  Use of agents associated with hypertension: none.   Outside blood pressures are not being checked. Symptoms: No chest pain No chest pressure  No palpitations No syncope  No dyspnea No orthopnea  No paroxysmal nocturnal dyspnea No lower extremity edema   Pertinent labs: Lab Results  Component Value Date   CHOL 220 (H) 12/17/2020   HDL 40 12/17/2020   LDLCALC 133 (H) 12/17/2020   TRIG 265 (H) 12/17/2020   CHOLHDL 4.2 03/20/2016   Lab Results  Component Value Date   NA 142 12/17/2020   K 3.9 12/17/2020   CREATININE 0.61 12/17/2020   GFRNONAA 107 12/17/2020   GFRAA 123 12/17/2020   GLUCOSE 106 (H) 12/17/2020     The 10-year ASCVD risk score Mikey Bussing DC Jr., et al., 2013) is: 6.9%   ---------------------------------------------------------------------------------------------------  Patient Active Problem List   Diagnosis Date Noted  . Muscle spasm of back 02/11/2021  . PTSD (post-traumatic stress disorder) 02/03/2021  . Bereavement 02/03/2021   . Spondylosis of lumbar region without myelopathy or radiculopathy 07/15/2016  . Irritable bowel syndrome 04/29/2016  . 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 Chronic pelvic pain in female 03/20/2016  . Allergic rhinitis 03/20/2016  . Hyperlipidemia 03/20/2016  . Osteoarthritis of hip 03/20/2016  . Heterozygous factor V Leiden mutation ([place]) 03/20/2016  . History of deep venous thrombosis 03/20/2016  . Primary osteoarthritis of both knees 10/23/2014  . DDD (degenerative disc disease), lumbar 10/04/2014  . Chondromalacia of knee, 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  . Neuritis or radiculitis due to rupture of lumbar intervertebral disc 09/12/2014  . Lumbar radiculitis 09/12/2014  . Edema of lower extremity 09/05/2014  . Bilateral leg edema 09/05/2014  . Hip impingement syndrome 06/19/2014  . Endometriosis 05/06/2010  . Uncontrolled hypertension 03/11/2009   Past Medical History:  Diagnosis Date  . B12 deficiency   . Degenerative disc disease, lumbar   . Depression   . DJD (degenerative joint disease)   . Eczema   . Edema   . Endometriosis   . High mean corpuscular hemoglobin concentration (MCHC)   . History of DVT (deep vein thrombosis)   . Hypertension   . IBS (irritable bowel syndrome)   . Osteoarthritis of right hip   . Panic attack   . Pernicious anemia    Allergies  Allergen Reactions  . Atenolol Hives    Urticaria.  . Gabapentin Swelling  . Nortriptyline Swelling  . Amlodipine Swelling  . Ibuprofen Other (  See Comments)    With prolonged use swelling, HTN and congestion.  . Latex Hives    Urticaria.  . Meloxicam     Other reaction(s): Other (See Comments) Upset stomach.  . Metoprolol Hives  . Nsaids Swelling  . Paxil Cr [Paroxetine Hcl Er] Other (See Comments)    hallucination       Medications: Outpatient Medications Prior to Visit  Medication Sig  . busPIRone (BUSPAR) 7.5 MG tablet Take 1 tablet (7.5 mg total) by mouth 2 (two) times daily.  . hydrochlorothiazide  (HYDRODIURIL) 25 MG tablet Take 1 tablet (25 mg total) by mouth daily.  . hydrOXYzine (ATARAX/VISTARIL) 10 MG tablet Take 1 tablet (10 mg total) by mouth 3 (three) times daily as needed.  . predniSONE (STERAPRED UNI-PAK 21 TAB) 10 MG (21) TBPK tablet PO: Take 6 tablets on day 1:Take 5 tablets day 2:Take 4 tablets day 3: Take 3 tablets day 4:Take 2 tablets day five: 5 Take 1 tablet day 6  . [DISCONTINUED] bisoprolol (ZEBETA) 10 MG tablet Take 1 tablet (10 mg total) by mouth daily.  . [DISCONTINUED] losartan (COZAAR) 50 MG tablet Take 1 tablet (50 mg total) by mouth daily.  . [DISCONTINUED] mirtazapine (REMERON) 7.5 MG tablet Take 1 tablet (7.5 mg total) by mouth at bedtime.  . [DISCONTINUED] baclofen (LIORESAL) 10 MG tablet Take 1 tablet (10 mg total) by mouth 3 (three) times daily. Will cause drowsiness.   No facility-administered medications prior to visit.    Review of Systems  Constitutional: Negative.   Respiratory: Negative.   Cardiovascular: Negative.        Objective    BP 119/76   Pulse 61   Resp 16   Wt 215 lb 9.6 oz (97.8 kg)   SpO2 97%   BMI 33.77 kg/m     Physical Exam Vitals reviewed.  Constitutional:      General: She is not in acute distress.    Appearance: She is well-developed. She is not diaphoretic.     Interventions: She is not intubated. HENT:     Head: Normocephalic and atraumatic.     Right Ear: External ear normal.     Left Ear: External ear normal.     Nose: Nose normal.     Mouth/Throat:     Pharynx: No oropharyngeal exudate.  Eyes:     General: Lids are normal. No scleral icterus.       Right eye: No discharge.        Left eye: No discharge.     Conjunctiva/sclera: Conjunctivae normal.     Right eye: Right conjunctiva is not injected. No exudate or hemorrhage.    Left eye: Left conjunctiva is not injected. No exudate or hemorrhage.    Pupils: Pupils are equal, round, and reactive to light.  Neck:     Thyroid: No thyroid mass or  thyromegaly.     Vascular: Normal carotid pulses. No carotid bruit, hepatojugular reflux or JVD.     Trachea: Trachea and phonation normal. No tracheal tenderness or tracheal deviation.     Meningeal: Brudzinski's sign and Kernig's sign absent.  Cardiovascular:     Rate and Rhythm: Normal rate and regular rhythm.     Pulses: Normal pulses.          Radial pulses are 2+ on the right side and 2+ on the left side.       Dorsalis pedis pulses are 2+ on the right side and 2+ on the left side.  Posterior tibial pulses are 2+ on the right side and 2+ on the left side.     Heart sounds: Normal heart sounds, S1 normal and S2 normal. Heart sounds not distant. No murmur heard. No friction rub. No gallop.   Pulmonary:     Effort: Pulmonary effort is normal. No tachypnea, bradypnea, accessory muscle usage or respiratory distress. She is not intubated.     Breath sounds: Normal breath sounds. No stridor. No wheezing or rales.  Chest:     Chest wall: No tenderness.  Breasts:     Right: No supraclavicular adenopathy.     Left: No supraclavicular adenopathy.    Abdominal:     General: Bowel sounds are normal. There is no distension or abdominal bruit.     Palpations: Abdomen is soft. There is no shifting dullness, fluid wave, hepatomegaly, splenomegaly, mass or pulsatile mass.     Tenderness: There is no abdominal tenderness. There is no guarding or rebound.     Hernia: No hernia is present.  Musculoskeletal:        General: No tenderness or deformity. Normal range of motion.     Cervical back: Full passive range of motion without pain, normal range of motion and neck supple. No edema, erythema or rigidity. No spinous process tenderness or muscular tenderness. Normal range of motion.  Lymphadenopathy:     Head:     Right side of head: No submental, submandibular, tonsillar, preauricular, posterior auricular or occipital adenopathy.     Left side of head: No submental, submandibular, tonsillar,  preauricular, posterior auricular or occipital adenopathy.     Cervical: No cervical adenopathy.     Right cervical: No superficial, deep or posterior cervical adenopathy.    Left cervical: No superficial, deep or posterior cervical adenopathy.     Upper Body:     Right upper body: No supraclavicular or pectoral adenopathy.     Left upper body: No supraclavicular or pectoral adenopathy.  Skin:    General: Skin is warm and dry.     Coloration: Skin is not pale.     Findings: No abrasion, bruising, burn, ecchymosis, erythema, lesion, petechiae or rash.     Nails: There is no clubbing.  Neurological:     Mental Status: She is alert and oriented to person, place, and time.     GCS: GCS eye subscore is 4. GCS verbal subscore is 5. GCS motor subscore is 6.     Cranial Nerves: No cranial nerve deficit.     Sensory: No sensory deficit.     Motor: No tremor, atrophy, abnormal muscle tone or seizure activity.     Coordination: Coordination normal.     Gait: Gait normal.     Deep Tendon Reflexes: Reflexes are normal and symmetric. Reflexes normal. Babinski sign absent on the right side. Babinski sign absent on the left side.     Reflex Scores:      Tricep reflexes are 2+ on the right side and 2+ on the left side.      Bicep reflexes are 2+ on the right side and 2+ on the left side.      Brachioradialis reflexes are 2+ on the right side and 2+ on the left side.      Patellar reflexes are 2+ on the right side and 2+ on the left side.      Achilles reflexes are 2+ on the right side and 2+ on the left side. Psychiatric:  Speech: Speech normal.        Behavior: Behavior normal.        Thought Content: Thought content normal.        Judgment: Judgment normal.       No results found for any visits on 02/25/21.  Assessment & Plan     Bradycardia - Plan: EKG 12-Lead, Ambulatory referral to Cardiology  Abnormal EKG - Plan: Ambulatory referral to Cardiology  Uncontrolled hypertension -  Plan: Ambulatory referral to Cardiology, losartan (COZAAR) 50 MG tablet, bisoprolol (ZEBETA) 5 MG tablet   Current Meds  Medication Sig  . busPIRone (BUSPAR) 7.5 MG tablet Take 1 tablet (7.5 mg total) by mouth 2 (two) times daily.  . hydrochlorothiazide (HYDRODIURIL) 25 MG tablet Take 1 tablet (25 mg total) by mouth daily.  . hydrOXYzine (ATARAX/VISTARIL) 10 MG tablet Take 1 tablet (10 mg total) by mouth 3 (three) times daily as needed.  . predniSONE (STERAPRED UNI-PAK 21 TAB) 10 MG (21) TBPK tablet PO: Take 6 tablets on day 1:Take 5 tablets day 2:Take 4 tablets day 3: Take 3 tablets day 4:Take 2 tablets day five: 5 Take 1 tablet day 6  . [DISCONTINUED] bisoprolol (ZEBETA) 10 MG tablet Take 1 tablet (10 mg total) by mouth daily.  . [DISCONTINUED] losartan (COZAAR) 50 MG tablet Take 1 tablet (50 mg total) by mouth daily.  . [DISCONTINUED] mirtazapine (REMERON) 7.5 MG tablet Take 1 tablet (7.5 mg total) by mouth at bedtime.    The entirety of the information documented in the History of Present Illness, Review of Systems and Physical Exam were personally obtained by me. Portions of this information were initially documented by the CMA and reviewed by me for thoroughness and accuracy.   Red Flags discussed. The patient was given clear instructions to go to ER or return to medical center if any red flags develop, symptoms do not improve, worsen or new problems develop. They verbalized understanding.   Return in about 2 weeks (around 03/11/2021), or if symptoms worsen or fail to improve, for at any time for any worsening symptoms, Go to Emergency room/ urgent care if worse.      The entirety of the information documented in the History of Present Illness, Review of Systems and Physical Exam were personally obtained by me. Portions of this information were initially documented by the CMA and reviewed by me for thoroughness and accuracy.      Marcille Buffy, [place] [phone] (phone) [phone] (fax)  [place]

## 2021-02-26 ENCOUNTER — Encounter: Payer: Self-pay | Admitting: Psychiatry

## 2021-02-26 ENCOUNTER — Telehealth (INDEPENDENT_AMBULATORY_CARE_PROVIDER_SITE_OTHER): Payer: 59 | Admitting: Psychiatry

## 2021-02-26 ENCOUNTER — Ambulatory Visit (INDEPENDENT_AMBULATORY_CARE_PROVIDER_SITE_OTHER): Payer: 59 | Admitting: Licensed Clinical Social Worker

## 2021-02-26 DIAGNOSIS — F4310 Post-traumatic stress disorder, unspecified: Secondary | ICD-10-CM

## 2021-02-26 DIAGNOSIS — Z634 Disappearance and death of family member: Secondary | ICD-10-CM

## 2021-02-26 NOTE — Progress Notes (Signed)
Virtual Visit via Video Note  I connected with VERDELLA LAIDLAW on 02/26/21 at  1:00 PM EDT by a video enabled telemedicine application and verified that I am speaking with the correct person using two identifiers.  Location: Patient: home Provider: [facility]   I discussed the limitations of evaluation and management by telemedicine and the availability of in person appointments. The patient expressed understanding and agreed to proceed.  I discussed the assessment and treatment plan with the patient. The patient was provided an opportunity to ask questions and all were answered. The patient agreed with the plan and demonstrated an understanding of the instructions.   The patient was advised to call back or seek an in-person evaluation if the symptoms worsen or if the condition fails to improve as anticipated.  I provided 60 minutes of non-face-to-face time during this encounter.   Yohana Bartha R Marika Mahaffy, LCSW    THERAPIST PROGRESS NOTE  Session Time: 1-2p  Participation Level: Active  Behavioral Response: Neat and Well GroomedAlertAnxious and Depressed  Type of Therapy: Individual Therapy  Treatment Goals addressed: Anxiety and Coping  Interventions: Supportive and Other: grief counseling  Summary: CHIRSTINA HAAN is a 50 y.o. female who presents with symptoms consistent with PTSD.   Pt reports that boyfriend died suddenly in December--pt performed CPR. Pt had an incident in the past where she performed CPR on her father and lost him when she was 33yo.  Pt feels this recent death has triggered underlying thoughts/feelings about father's death that pt has not dealt with in all these years.  Allowed pt to explore relationship with her boyfriend and express as much as she can about his loss. Pt was good about identifying triggers and identifying coping skills that are helpful. Pt has lots of self help books and has been involved in grief share classes.   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 Pt reports that there are emotional meltdowns and other behavioral concerns that she has to deal with on a daily basis. Pt is trying hard to use rewards and consequences to modify nephew's behavior.   Allowed pt to explore her personal grief journey and encouraged her to focus on self-care and healing emotionally as she continues on the journey.   History of anxiety/panic attacks in college.   Continued recommendations are as follows: self care behaviors, positive social engagements, focusing on overall work/home/life balance, and focusing on positive physical and emotional wellness.   Suicidal/Homicidal: No  Therapist Response: initial session--assessment and treatment plan development.  Plan: Return again in 4 weeks. Complete [facility] next appt.  Diagnosis: Axis I: Bereavement and Post Traumatic Stress Disorder    Axis II: No diagnosis    Rachel Bo Dasha Kawabata, LCSW 02/26/2021

## 2021-02-26 NOTE — Progress Notes (Addendum)
Virtual Visit via Video Note  I connected with Michelle Simpson on 02/26/21 at  2:00 PM EDT by a video enabled telemedicine application and verified that I am speaking with the correct person using two identifiers.  Location Provider Location : [facility] Patient Location : Home  Participants: Patient , Provider    I discussed the limitations of evaluation and management by telemedicine and the availability of in person appointments. The patient expressed understanding and agreed to proceed.    I discussed the assessment and treatment plan with the patient. The patient was provided an opportunity to ask questions and all were answered. The patient agreed with the plan and demonstrated an understanding of the instructions.   The patient was advised to call back or seek an in-person evaluation if the symptoms worsen or if the condition fails to improve as anticipated.   [facility] MD OP Progress Note  02/26/2021 3:44 PM SHELLEY COCKE  MRN:  [id number]  Chief Complaint:  Chief Complaint    Follow-up; Anxiety; Depression     HPI: Michelle Simpson is a 50 year old Caucasian female, employed, divorced, lives in [place], has a history of PTSD, bereavement, high blood pressure, IBS, osteoarthritis, pernicious anemia was evaluated by telemedicine today.  Patient reports she started taking the mirtazapine which helped to some extent with her sleep.  She however noticed that she started sleeping excessively 13 to 14 hours and continued to feel groggy later on.  She reports recently she started having bradycardia as well as problems with her blood pressure.  Her primary care provider hence decided to take her off of some of the medications and advised her to also stop taking the mirtazapine since it was contributing to excessive sleepiness.  She hence stopped taking it.  Last night in spite of not being on it she reports she slept okay.  She continues to be on the BuSpar.  Denies side effects.  She reports she  continues to have crying spells, grieves the loss of her boyfriend but believes she is making progress.  She has started therapy and reports therapy sessions are beneficial.  Patient denies any suicidality, homicidality or perceptual disturbances.  She reports her nightmares has improved especially when she was on the mirtazapine she did not have it at night.  She did have some vivid dreams during the day when she would take a nap.  Patient denies any other concerns today.    Visit Diagnosis:    ICD-10-CM   1. PTSD (post-traumatic stress disorder)  F43.10   2. Bereavement  Z63.4     Past Psychiatric History: I have reviewed past psychiatric history from my progress note on 02/03/2021.  SSRIs make her sick.  She tried Paxil in the past which caused her to hallucinate.  Past Medical History:  Past Medical History:  Diagnosis Date  . B12 deficiency   . Degenerative disc disease, lumbar   . Depression   . DJD (degenerative joint disease)   . Eczema   . Edema   . Endometriosis   . High mean corpuscular hemoglobin concentration (MCHC)   . History of DVT (deep vein thrombosis)   . Hypertension   . IBS (irritable bowel syndrome)   . Osteoarthritis of right hip   . Panic attack   . Pernicious anemia     Past Surgical History:  Procedure Laterality Date  . ABDOMINAL HYSTERECTOMY    . ANKLE SURGERY Right   . ANKLE SURGERY Right    plate placed  .  Bilateral knee RFA nerve ablation @ [facility] hospitals    . CHOLECYSTECTOMY    . KNEE SURGERY Left 2012  . LACRIMAL DUCT RECONSTRUCTION    . LAPAROSCOPY    . miscarriage D&C  1995  . TYMPANOSTOMY TUBE PLACEMENT  1977    Family Psychiatric History: I have reviewed family psychiatric history from my progress note on 02/03/2021  Family History:  Family History  Problem Relation Age of Onset  . Hypertension Father   . Hypertension Mother   . Deep vein thrombosis Mother   . Narcolepsy Mother   . Anxiety disorder Mother   .  Hypertension Maternal Grandmother   . Hypertension Paternal Grandmother   . Hypertension Brother   . Heart attack Brother   . Liver disease Brother     Social History: Reviewed social history from my progress note on 02/03/2021 Social History   Socioeconomic History  . Marital status: Divorced    Spouse name: Not on file  . Number of children: 1  . Years of education: Not on file  . Highest education level: Not on file  Occupational History  . Not on file  Tobacco Use  . Smoking status: Current Every Day Smoker    Packs/day: 1.00    Years: 20.00    Pack years: 20.00    Types: Cigarettes    Last attempt to quit: 11/17/2015    Years since quitting: 5.2  . Smokeless tobacco: Never Used  . Tobacco comment: reported 02/26/2021  Vaping Use  . Vaping Use: Former  . Start date: 11/17/2015  . Quit date: 01/07/2020  Substance and Sexual Activity  . Alcohol use: Not Currently    Alcohol/week: 0.0 standard drinks  . Drug use: No  . Sexual activity: Never  Other Topics Concern  . Not on file  Social History Narrative  . Not on file   Social Determinants of Health   Financial Resource Strain: Not on file  Food Insecurity: Not on file  Transportation Needs: Not on file  Physical Activity: Not on file  Stress: Not on file  Social Connections: Not on file    Allergies:  Allergies  Allergen Reactions  . Atenolol Hives    Urticaria.  . Gabapentin Swelling  . Nortriptyline Swelling  . Amlodipine Swelling  . Ibuprofen Other (See Comments)    With prolonged use swelling, HTN and congestion.  . Latex Hives    Urticaria.  . Meloxicam     Other reaction(s): Other (See Comments) Upset stomach.  . Metoprolol Hives  . Nsaids Swelling  . Paxil Cr [Paroxetine Hcl Er] Other (See Comments)    hallucination    Metabolic Disorder Labs: Lab Results  Component Value Date   HGBA1C 5.4 12/17/2020   No results found for: PROLACTIN Lab Results  Component Value Date   CHOL 220 (H)  12/17/2020   TRIG 265 (H) 12/17/2020   HDL 40 12/17/2020   CHOLHDL 4.2 03/20/2016   LDLCALC 133 (H) 12/17/2020   LDLCALC 174 (H) 03/20/2016   Lab Results  Component Value Date   TSH 0.677 12/17/2020    Therapeutic Level Labs: No results found for: LITHIUM No results found for: VALPROATE No components found for:  CBMZ  Current Medications: Current Outpatient Medications  Medication Sig Dispense Refill  . bisoprolol (ZEBETA) 5 MG tablet Take 1 tablet (5 mg total) by mouth daily. 90 tablet 0  . busPIRone (BUSPAR) 7.5 MG tablet Take 1 tablet (7.5 mg total) by mouth 2 (two) times  daily. 180 tablet 0  . hydrochlorothiazide (HYDRODIURIL) 25 MG tablet Take 1 tablet (25 mg total) by mouth daily. 90 tablet 0  . hydrOXYzine (ATARAX/VISTARIL) 10 MG tablet Take 1 tablet (10 mg total) by mouth 3 (three) times daily as needed. 30 tablet 0  . losartan (COZAAR) 50 MG tablet Take 0.5 tablets (25 mg total) by mouth daily. 90 tablet 0  . predniSONE (STERAPRED UNI-PAK 21 TAB) 10 MG (21) TBPK tablet PO: Take 6 tablets on day 1:Take 5 tablets day 2:Take 4 tablets day 3: Take 3 tablets day 4:Take 2 tablets day five: 5 Take 1 tablet day 6 21 tablet 0   No current facility-administered medications for this visit.     Musculoskeletal: Strength & Muscle Tone: UTA Gait & Station: UTA Patient leans: N/A  Psychiatric Specialty Exam: Review of Systems  Psychiatric/Behavioral: Positive for sleep disturbance.       Grieving  All other systems reviewed and are negative.   There were no vitals taken for this visit.There is no height or weight on file to calculate BMI.  General Appearance: Casual  Eye Contact:  Fair  Speech:  Clear and Coherent  Volume:  Normal  Mood:  Grieving  Affect:  Congruent  Thought Process:  Goal Directed and Descriptions of Associations: Intact  Orientation:  Full (Time, Place, and Person)  Thought Content: Logical   Suicidal Thoughts:  No  Homicidal Thoughts:  No  Memory:   Immediate;   Fair Recent;   Fair Remote;   Fair  Judgement:  Fair  Insight:  Fair  Psychomotor Activity:  Normal  Concentration:  Concentration: Fair and Attention Span: Fair  Recall:  [employer] of Knowledge: Fair  Language: Fair  Akathisia:  No  Handed:  Right  AIMS (if indicated): UTA  Assets:  Communication Skills Desire for Improvement Housing Social Support  ADL's:  Intact  Cognition: WNL  Sleep:  Excessive-improving   Screenings: PHQ2-9   Flowsheet Row Video Visit from 02/26/2021 in [facility] Video Visit from 02/03/2021 in [facility] Office Visit from 12/31/2020 in [facility] Visit from 12/31/2016 in [facility]  PHQ-2 Total Score 2 4 2 1   PHQ-9 Total Score 10 12 14  --    Flowsheet Row Video Visit from 02/26/2021 in [facility] Video Visit from 02/03/2021 in [facility] No Risk No Risk       Assessment and Plan: SABINE TENENBAUM is a 50 year old Caucasian female, employed, divorced, lives in [place], has a history of multiple medical problems as well as bereavement, sleep problems was evaluated by telemedicine today.  Patient is biologically predisposed given history of multiple deaths in the family, history of trauma.  Patient with recent medical problems including adverse side effects to mirtazapine will benefit from the following plan.  Plan PTSD-improving Continue BuSpar 7.5 mg p.o. twice daily Discontinue mirtazapine for noncompliance and side effects Continue CBT with Ms. Christina Hussami  Bereavement-improving Continue grief support groups. Continue CBT  Discussed with patient to continue to follow-up with primary care provider for management of her blood pressure and heart rate problems.  Once she is stable she could return for further medication changes for her PTSD and sleep.  However  patient advised to reach out sooner if she is not doing well.  Until then she can continue psychotherapy sessions on a more frequent basis.  Follow-up in clinic in 2 months or  sooner in person.  This note was generated in part or whole with voice recognition software. Voice recognition is usually quite accurate but there are transcription errors that can and very often do occur. I apologize for any typographical errors that were not detected and corrected.        Ursula Alert, MD 02/26/2021, 3:44 PM

## 2021-03-05 ENCOUNTER — Other Ambulatory Visit: Payer: Self-pay

## 2021-03-05 ENCOUNTER — Ambulatory Visit
Admission: RE | Admit: 2021-03-05 | Discharge: 2021-03-05 | Disposition: A | Discharge status: Home / Self Care | Payer: 59 | Attending: Adult Health | Admitting: Adult Health

## 2021-03-05 ENCOUNTER — Ambulatory Visit
Admission: RE | Admit: 2021-03-05 | Discharge: 2021-03-05 | Disposition: A | Discharge status: Home / Self Care | Payer: 59 | Source: Ambulatory Visit | Attending: Adult Health | Admitting: Adult Health

## 2021-03-05 ENCOUNTER — Encounter: Payer: Self-pay | Admitting: Adult Health

## 2021-03-05 ENCOUNTER — Ambulatory Visit (INDEPENDENT_AMBULATORY_CARE_PROVIDER_SITE_OTHER): Payer: 59 | Admitting: Adult Health

## 2021-03-05 VITALS — BP 135/92 | HR 59 | Ht 67.0 in | Wt 212.6 lb

## 2021-03-05 DIAGNOSIS — E559 Vitamin D deficiency, unspecified: Secondary | ICD-10-CM | POA: Diagnosis not present

## 2021-03-05 DIAGNOSIS — D6851 Activated protein C resistance: Secondary | ICD-10-CM

## 2021-03-05 DIAGNOSIS — R5383 Other fatigue: Secondary | ICD-10-CM | POA: Insufficient documentation

## 2021-03-05 DIAGNOSIS — E538 Deficiency of other specified B group vitamins: Secondary | ICD-10-CM | POA: Insufficient documentation

## 2021-03-05 DIAGNOSIS — Z8739 Personal history of other diseases of the musculoskeletal system and connective tissue: Secondary | ICD-10-CM | POA: Diagnosis not present

## 2021-03-05 DIAGNOSIS — F17200 Nicotine dependence, unspecified, uncomplicated: Secondary | ICD-10-CM | POA: Diagnosis not present

## 2021-03-05 NOTE — Progress Notes (Signed)
Established patient visit   [name]   DOB: September 25, 1971   50 y.o. Female  MRN: [id number] Visit Date: 03/05/2021  Today's healthcare provider: Marcille Buffy, FNP   Chief Complaint  Patient presents with  . Hypertension   Subjective    HPI  Hypertension, follow-up  BP Readings from Last 3 Encounters:  03/05/21 (!) 135/92  02/25/21 119/76  02/11/21 (!) 152/87   Wt Readings from Last 3 Encounters:  03/05/21 212 lb 9.6 oz (96.4 kg)  02/25/21 215 lb 9.6 oz (97.8 kg)  02/11/21 219 lb (99.3 kg)     She was last seen for hypertension 1 weeks ago.  BP at that visit was 152/87. Management since that visit includespatient was started on Bisoprolol, increasing HCTZ 12.5mg  to 25mg .  She reports excellent compliance with treatment. She is not having side effects.  She is following a Regular diet. She is sometimes exercising. She does smoke. Seeing cardiology next week still having fatigue.  Use of agents associated with hypertension: none.  Patient continues to complain of fatigue, she has been scheduled cardiology for next week, she denies any chest pain, she does have a history of bradycardia which is thought to be induced by medication.  However since she is female and has risk factors including smoking, hypertension, hyperlipidemia will have her see cardiology for evaluation and possible stress test.  She does stay in the woods frequently she reports that she has a dog that has had ticks inside the house.  She denies any tick bites herself.  We will check her for Lyme's as well.  Outside blood pressures are n/a. Symptoms: Yes chest pain No chest pressure  No palpitations No syncope  No dyspnea No orthopnea  No paroxysmal nocturnal dyspnea No lower extremity edema   Patient  denies any fever, body aches,chills, rash, chest pain, shortness of breath, nausea, vomiting, or diarrhea.  Denies dizziness, lightheadedness, pre syncopal or syncopal episodes.    Pertinent labs: Lab Results  Component Value Date   CHOL 220 (H) 12/17/2020   HDL 40 12/17/2020   LDLCALC 133 (H) 12/17/2020   TRIG 265 (H) 12/17/2020   CHOLHDL 4.2 03/20/2016   Lab Results  Component Value Date   NA 142 12/17/2020   K 3.9 12/17/2020   CREATININE 0.61 12/17/2020   GFRNONAA 107 12/17/2020   GFRAA 123 12/17/2020   GLUCOSE 106 (H) 12/17/2020     The 10-year ASCVD risk score Mikey Bussing DC Jr., et al., 2013) is: 8.8%   ---------------------------------------------------------------------------------------------------     Medications: Outpatient Medications Prior to Visit  Medication Sig  . bisoprolol (ZEBETA) 5 MG tablet Take 1 tablet (5 mg total) by mouth daily.  . busPIRone (BUSPAR) 7.5 MG tablet Take 1 tablet (7.5 mg total) by mouth 2 (two) times daily.  . hydrochlorothiazide (HYDRODIURIL) 25 MG tablet Take 1 tablet (25 mg total) by mouth daily.  . hydrOXYzine (ATARAX/VISTARIL) 10 MG tablet Take 1 tablet (10 mg total) by mouth 3 (three) times daily as needed.  Marland Kitchen losartan (COZAAR) 50 MG tablet Take 0.5 tablets (25 mg total) by mouth daily.  . predniSONE (STERAPRED UNI-PAK 21 TAB) 10 MG (21) TBPK tablet PO: Take 6 tablets on day 1:Take 5 tablets day 2:Take 4 tablets day 3: Take 3 tablets day 4:Take 2 tablets day five: 5 Take 1 tablet day 6 (Patient not taking: Reported on 03/05/2021)   No facility-administered medications prior to visit.    Review of Systems  Constitutional: Positive for activity change and fatigue. Negative for appetite change, chills, diaphoresis, fever and unexpected weight change.  Respiratory: Positive for shortness of breath. Negative for cough, choking, chest tightness, wheezing and stridor.   Cardiovascular: Positive for palpitations. Negative for chest pain and leg swelling.  Gastrointestinal: Negative.   Genitourinary: Negative.   Musculoskeletal: Positive for arthralgias.  Neurological: Negative.   Hematological: Negative for  adenopathy.  Psychiatric/Behavioral: The patient is nervous/anxious.        Objective    BP (!) 135/92 (BP Location: Right Arm, Patient Position: Sitting, Cuff Size: Large)   Pulse (!) 59   Ht 5\' 7"  (1.702 m)   Wt 212 lb 9.6 oz (96.4 kg)   SpO2 96%   BMI 33.30 kg/m     Physical Exam Vitals reviewed.  Constitutional:      General: She is not in acute distress.    Appearance: Normal appearance. She is not ill-appearing, toxic-appearing or diaphoretic.  HENT:     Head: Normocephalic and atraumatic.  Eyes:     Extraocular Movements: Extraocular movements intact.     Conjunctiva/sclera: Conjunctivae normal.     Pupils: Pupils are equal, round, and reactive to light.  Cardiovascular:     Rate and Rhythm: Normal rate and regular rhythm.     Pulses: Normal pulses.     Heart sounds: Normal heart sounds.  Abdominal:     General: There is no distension.     Palpations: Abdomen is soft.     Tenderness: There is no abdominal tenderness.  Skin:    General: Skin is warm.     Findings: No erythema or rash.  Neurological:     Mental Status: She is oriented to person, place, and time.  Psychiatric:        Mood and Affect: Mood normal.        Behavior: Behavior normal.        Thought Content: Thought content normal.        Judgment: Judgment normal.      No results found for any visits on 03/05/21.  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 DG Chest 2 View  . Lyme Disease, IgM, Early Test w/ Rflx  . CBC with Differential/Platelet  . Comprehensive Metabolic Panel (CMET)  . B12  . TSH  . ANA Direct w/Reflex if  Positive   Advise her to keep her follow-up with cardiology for evaluation, need for ZIO monitor, possible stress test and further work-up given her severe's fatigue and cardiology risk factors including smoker, hypertension, hyperlipidemia.  EKG was sent to cardiology for review as well at previous visit Red Flags discussed. The patient was given clear instructions to go to ER or return to medical center if any red flags develop, symptoms do not improve, worsen or new problems develop. They verbalized understanding.   Return in about 1 month (around 04/04/2021), or if symptoms worsen or fail to improve, for at any time for any worsening symptoms, Go to Emergency room/ urgent care if worse.     The entirety of the information documented in the History of Present Illness, Review of  Systems and Physical Exam were personally obtained by me. Portions of this information were initially documented by the CMA and reviewed by me for thoroughness and accuracy.      Marcille Buffy, [place] [phone] (phone) [phone] (fax)  [place]

## 2021-03-05 NOTE — Patient Instructions (Signed)

## 2021-03-06 LAB — COMPREHENSIVE METABOLIC PANEL
ALT: 18 IU/L (ref 0–32)
AST: 12 IU/L (ref 0–40)
Albumin/Globulin Ratio: 1.9 (ref 1.2–2.2)
Albumin: 4.5 g/dL (ref 3.8–4.8)
Alkaline Phosphatase: 98 IU/L (ref 44–121)
BUN/Creatinine Ratio: 9 (ref 9–23)
BUN: 7 mg/dL (ref 6–24)
Bilirubin Total: 0.4 mg/dL (ref 0.0–1.2)
CO2: 24 mmol/L (ref 20–29)
Calcium: 10 mg/dL (ref 8.7–10.2)
Chloride: 100 mmol/L (ref 96–106)
Creatinine, Ser: 0.77 mg/dL (ref 0.57–1.00)
Globulin, Total: 2.4 g/dL (ref 1.5–4.5)
Glucose: 106 mg/dL — ABNORMAL HIGH (ref 65–99)
Potassium: 4.5 mmol/L (ref 3.5–5.2)
Sodium: 140 mmol/L (ref 134–144)
Total Protein: 6.9 g/dL (ref 6.0–8.5)
eGFR: 94 mL/min/{1.73_m2} (ref >59)

## 2021-03-06 LAB — CBC WITH DIFFERENTIAL/PLATELET
Basophils Absolute: 0 10*3/uL (ref 0.0–0.2)
Basos: 0 %
EOS (ABSOLUTE): 0.1 10*3/uL (ref 0.0–0.4)
Eos: 1 %
Hematocrit: 46 % (ref 34.0–46.6)
Hemoglobin: 16.1 g/dL — ABNORMAL HIGH (ref 11.1–15.9)
Immature Grans (Abs): 0 10*3/uL (ref 0.0–0.1)
Immature Granulocytes: 0 %
Lymphocytes Absolute: 1.5 10*3/uL (ref 0.7–3.1)
Lymphs: 13 %
MCH: 31.7 pg (ref 26.6–33.0)
MCHC: 35 g/dL (ref 31.5–35.7)
MCV: 91 fL (ref 79–97)
Monocytes Absolute: 0.6 10*3/uL (ref 0.1–0.9)
Monocytes: 5 %
Neutrophils Absolute: 8.8 10*3/uL — ABNORMAL HIGH (ref 1.4–7.0)
Neutrophils: 81 %
Platelets: 323 10*3/uL (ref 150–450)
RBC: 5.08 x10E6/uL (ref 3.77–5.28)
RDW: 14.1 % (ref 11.7–15.4)
WBC: 11.1 10*3/uL — ABNORMAL HIGH (ref 3.4–10.8)

## 2021-03-06 LAB — VITAMIN B12: Vitamin B-12: 302 pg/mL (ref 232–1245)

## 2021-03-06 LAB — ANA W/REFLEX IF POSITIVE: Anti Nuclear Antibody (ANA): NEGATIVE

## 2021-03-06 LAB — TSH: TSH: 0.748 u[IU]/mL (ref 0.450–4.500)

## 2021-03-06 LAB — LYME, IGM, EARLY TEST/REFLEX: LYME DISEASE AB, QUANT, IGM: <0.8 index (ref 0.00–0.79)

## 2021-03-10 ENCOUNTER — Ambulatory Visit (INDEPENDENT_AMBULATORY_CARE_PROVIDER_SITE_OTHER): Payer: 59 | Admitting: Cardiology

## 2021-03-10 ENCOUNTER — Encounter: Payer: Self-pay | Admitting: Cardiology

## 2021-03-10 ENCOUNTER — Other Ambulatory Visit: Payer: Self-pay

## 2021-03-10 VITALS — BP 100/68 | HR 71 | Ht 67.0 in | Wt 210.0 lb

## 2021-03-10 DIAGNOSIS — R0683 Snoring: Secondary | ICD-10-CM | POA: Diagnosis not present

## 2021-03-10 DIAGNOSIS — I10 Essential (primary) hypertension: Secondary | ICD-10-CM | POA: Diagnosis not present

## 2021-03-10 DIAGNOSIS — R0602 Shortness of breath: Secondary | ICD-10-CM

## 2021-03-10 DIAGNOSIS — F17200 Nicotine dependence, unspecified, uncomplicated: Secondary | ICD-10-CM

## 2021-03-10 DIAGNOSIS — R5383 Other fatigue: Secondary | ICD-10-CM | POA: Diagnosis not present

## 2021-03-10 NOTE — Progress Notes (Signed)
Cardiology Office Note:    Date:  03/10/2021   ID:  Michelle Simpson, DOB September 20, 1971, MRN [id number]  PCP:  Chrismon, Dennis E, [place]  Cardiologist:  Kate Sable, MD  Advanced Practice Provider:  No care team member to display Electrophysiologist:  None       Referring MD: Sharmon Leyden*   Chief Complaint  Patient presents with  . New patient    Referred by PCP for low HR, Elevated BP, and abnormal EKG. Meds reviewed verbally with patient.     Michelle Simpson is a 50 y.o. female who is being seen today for the evaluation of abnormal EKG, bradycardia at the request of Flinchum, Kelby Aline, F*.  History of Present Illness:    Michelle Simpson is a 50 y.o. female with a hx of hypertension, current smoker x25+ years, who presents due to bradycardia.  Patient has a history of hypertension, lost insurance and as such did not take BP meds for about 18 months.  Was recently restarted on blood pressure meds 2 months ago when her systolic was noted to be over 213Y and diastolic over 865H.  Medication list included bisoprolol, which patient took 10 mg daily.  Her heart rates were noted to be low, with rates as low as 40s to 50s.  Bisoprolol was reduced to 5 mg, with improvement, although she occasionally notices heart rate in the 50s.  She states being able to walk about a mile a fast pace without symptoms of chest pain or shortness of breath.  She also notes fatigue, usually occurring after she takes her BP meds in the morning.  Endorsed snoring, sleeps for about 12 hours, still has fatigue during the day, feels sleepy.  Currently smokes, is working on quitting.  Recently lost her fianc and is going through grief, takes BuSpar and Atarax for mood stabilizing.  Past Medical History:  Diagnosis Date  . B12 deficiency   . Degenerative disc disease, lumbar   . Depression   . DJD (degenerative joint disease)   . Eczema   . Edema   . Endometriosis    . High mean corpuscular hemoglobin concentration (MCHC)   . History of DVT (deep vein thrombosis)   . Hypertension   . IBS (irritable bowel syndrome)   . Osteoarthritis of right hip   . Panic attack   . Pernicious anemia     Past Surgical History:  Procedure Laterality Date  . ABDOMINAL HYSTERECTOMY    . ANKLE SURGERY Right   . ANKLE SURGERY Right    plate placed  . Bilateral knee RFA nerve ablation @ [facility] hospitals    . CHOLECYSTECTOMY    . KNEE SURGERY Left 2012  . LACRIMAL DUCT RECONSTRUCTION    . LAPAROSCOPY    . miscarriage D&C  1995  . TYMPANOSTOMY TUBE PLACEMENT  1977    Current Medications: Current Meds  Medication Sig  . busPIRone (BUSPAR) 7.5 MG tablet Take 1 tablet (7.5 mg total) by mouth 2 (two) times daily.  . hydrochlorothiazide (HYDRODIURIL) 25 MG tablet Take 1 tablet (25 mg total) by mouth daily.  . hydrOXYzine (ATARAX/VISTARIL) 10 MG tablet Take 1 tablet (10 mg total) by mouth 3 (three) times daily as needed.  Marland Kitchen losartan (COZAAR) 50 MG tablet Take 0.5 tablets (25 mg total) by mouth daily.  . [DISCONTINUED] bisoprolol (ZEBETA) 5 MG tablet Take 1 tablet (5 mg total) by mouth daily.  Allergies:   Atenolol, Gabapentin, Nortriptyline, Amlodipine, Ibuprofen, Latex, Meloxicam, Metoprolol, Nsaids, and Paxil cr [paroxetine hcl er]   Social History   Socioeconomic History  . Marital status: Divorced    Spouse name: Not on file  . Number of children: 1  . Years of education: Not on file  . Highest education level: Not on file  Occupational History  . Not on file  Tobacco Use  . Smoking status: Current Every Day Smoker    Packs/day: 1.00    Years: 20.00    Pack years: 20.00    Types: Cigarettes    Last attempt to quit: 11/17/2015    Years since quitting: 5.3  . Smokeless tobacco: Never Used  Vaping Use  . Vaping Use: Former  . Start date: 11/17/2015  . Quit date: 01/07/2020  Substance and Sexual Activity  . Alcohol use: Not Currently    Alcohol/week:  0.0 standard drinks  . Drug use: No  . Sexual activity: Never  Other Topics Concern  . Not on file  Social History Narrative  . Not on file   Social Determinants of Health   Financial Resource Strain: Not on file  Food Insecurity: Not on file  Transportation Needs: Not on file  Physical Activity: Not on file  Stress: Not on file  Social Connections: Not on file     Family History: The patient's family history includes Anxiety disorder in her mother; Deep vein thrombosis in her mother; Heart attack in her brother; Hypertension in her brother, father, maternal grandmother, mother, and paternal grandmother; Liver disease in her brother; Narcolepsy in her mother.  ROS:   Please see the history of present illness.     All other systems reviewed and are negative.  EKGs/Labs/Other Studies Reviewed:    The following studies were reviewed today:   EKG:  EKG is  ordered today.  The ekg ordered today demonstrates normal sinus rhythm,  Recent Labs: 03/05/2021: ALT 18; BUN 7; Creatinine, Ser 0.77; Hemoglobin 16.1; Platelets 323; Potassium 4.5; Sodium 140; TSH 0.748  Recent Lipid Panel    Component Value Date/Time   CHOL 220 (H) 12/17/2020 0917   TRIG 265 (H) 12/17/2020 0917   HDL 40 12/17/2020 0917   CHOLHDL 4.2 03/20/2016 1010   LDLCALC 133 (H) 12/17/2020 0917     Risk Assessment/Calculations:      Physical Exam:    VS:  BP 100/68 (BP Location: Left Arm, Patient Position: Sitting, Cuff Size: Large)   Pulse 71   Ht 5\' 7"  (1.702 m)   Wt 210 lb (95.3 kg)   SpO2 93%   BMI 32.89 kg/m     Wt Readings from Last 3 Encounters:  03/10/21 210 lb (95.3 kg)  03/05/21 212 lb 9.6 oz (96.4 kg)  02/25/21 215 lb 9.6 oz (97.8 kg)     GEN:  Well nourished, well developed in no acute distress HEENT: Normal NECK: No JVD; No carotid bruits LYMPHATICS: No lymphadenopathy CARDIAC: RRR, no murmurs, rubs, gallops RESPIRATORY:  Clear to auscultation without rales, wheezing or rhonchi   ABDOMEN: Soft, non-tender, non-distended MUSCULOSKELETAL:  No edema; No deformity  SKIN: Warm and dry NEUROLOGIC:  Alert and oriented x 3 PSYCHIATRIC:  Normal affect   ASSESSMENT:    1. Other fatigue   2. Snoring   3. Primary hypertension   4. Smoking   5. SOB (shortness of breath)    PLAN:    In order of problems listed above:  1. Fatigue, unsure if this  is secondary to overcorrection of blood pressure.  Will obtain echo to evaluate any dysfunction.  We will stop bisoprolol due to history of low heart rates, fatigue, low normal BP today. 2. History of snoring, fatigue, daytime somnolence.  Refer to pulm medicine for OSA eval. 3. Hypertension, blood pressure low normal today.  Stop bisoprolol.  Continue Cozaar and HCTZ. 4. Current smoker, cessation advised.  Follow-up after echocardiogram.     Medication Adjustments/Labs and Tests Ordered: Current medicines are reviewed at length with the patient today.  Concerns regarding medicines are outlined above.  Orders Placed This Encounter  Procedures  . Ambulatory referral to Pulmonology  . EKG 12-Lead  . ECHOCARDIOGRAM COMPLETE   No orders of the defined types were placed in this encounter.   Patient Instructions  Medication Instructions:   1.  STOP taking your Bisoprolol.   *If you need a refill on your cardiac medications before your next appointment, please call your pharmacy*   Lab Work: None ordered If you have labs (blood work) drawn today and your tests are completely normal, you will receive your results only by: Marland Kitchen MyChart Message (if you have MyChart) OR . A paper copy in the mail If you have any lab test that is abnormal or we need to change your treatment, we will call you to review the results.   Testing/Procedures:  1.  Your physician has requested that you have an echocardiogram. Echocardiography is a painless test that uses sound waves to create images of your heart. It provides your doctor with  information about the size and shape of your heart and how well your heart's chambers and valves are working. This procedure takes approximately one hour. There are no restrictions for this procedure.    Follow-Up: At [facility], you and your health needs are our priority.  As part of our continuing mission to provide you with exceptional heart care, we have created designated Provider Care Teams.  These Care Teams include your primary Cardiologist (physician) and Advanced Practice Providers (APPs -  Physician Assistants and Nurse Practitioners) who all work together to provide you with the care you need, when you need it.  We recommend signing up for the patient portal called "MyChart".  Sign up information is provided on this After Visit Summary.  MyChart is used to connect with patients for Virtual Visits (Telemedicine).  Patients are able to view lab/test results, encounter notes, upcoming appointments, etc.  Non-urgent messages can be sent to your provider as well.   To learn more about what you can do with MyChart, go to [email or web address].    Your next appointment  Follow up after Echo  The format for your next appointment:   In Person  Provider:   Kate Sable, MD   Other Instructions      Signed, Kate Sable, MD  03/10/2021 5:26 PM    [place]

## 2021-03-10 NOTE — Patient Instructions (Signed)
Medication Instructions:   1.  STOP taking your Bisoprolol.   *If you need a refill on your cardiac medications before your next appointment, please call your pharmacy*   Lab Work: None ordered If you have labs (blood work) drawn today and your tests are completely normal, you will receive your results only by: Marland Kitchen MyChart Message (if you have MyChart) OR . A paper copy in the mail If you have any lab test that is abnormal or we need to change your treatment, we will call you to review the results.   Testing/Procedures:  1.  Your physician has requested that you have an echocardiogram. Echocardiography is a painless test that uses sound waves to create images of your heart. It provides your doctor with information about the size and shape of your heart and how well your heart's chambers and valves are working. This procedure takes approximately one hour. There are no restrictions for this procedure.    Follow-Up: At [facility], you and your health needs are our priority.  As part of our continuing mission to provide you with exceptional heart care, we have created designated Provider Care Teams.  These Care Teams include your primary Cardiologist (physician) and Advanced Practice Providers (APPs -  Physician Assistants and Nurse Practitioners) who all work together to provide you with the care you need, when you need it.  We recommend signing up for the patient portal called "MyChart".  Sign up information is provided on this After Visit Summary.  MyChart is used to connect with patients for Virtual Visits (Telemedicine).  Patients are able to view lab/test results, encounter notes, upcoming appointments, etc.  Non-urgent messages can be sent to your provider as well.   To learn more about what you can do with MyChart, go to [email or web address].    Your next appointment  Follow up after Echo  The format for your next appointment:   In Person  Provider:   Kate Sable,  MD   Other Instructions

## 2021-03-19 ENCOUNTER — Other Ambulatory Visit: Payer: Self-pay

## 2021-03-19 ENCOUNTER — Ambulatory Visit (INDEPENDENT_AMBULATORY_CARE_PROVIDER_SITE_OTHER): Payer: 59 | Admitting: Licensed Clinical Social Worker

## 2021-03-19 DIAGNOSIS — F4310 Post-traumatic stress disorder, unspecified: Secondary | ICD-10-CM | POA: Diagnosis not present

## 2021-03-19 DIAGNOSIS — Z634 Disappearance and death of family member: Secondary | ICD-10-CM | POA: Diagnosis not present

## 2021-03-19 NOTE — Progress Notes (Signed)
In-office Outpatient Therapy Note  I connected with Michelle Simpson on 03/19/21 at  1:00 PM EDT in person and verified that I am speaking with the correct person using two identifiers.  Location: Patient: [facility]  Provider: [facility]   I discussed the assessment and treatment plan with the patient. The patient was provided an opportunity to ask questions and all were answered. The patient agreed with the plan and demonstrated an understanding of the instructions.   I provided 60 minutes of face-to-face time during this encounter.   Kodee Drury R Emmilee Reamer, LCSW    THERAPIST PROGRESS NOTE  Session Time: 1-2p  Participation Level: Active  Behavioral Response: CasualAlertAnxious  Type of Therapy: Individual Therapy  Treatment Goals addressed: Anxiety and Coping  Interventions: Supportive  Summary: Michelle Simpson is a 50 y.o. female who presents with continuing symptoms related to trauma and bereavement. Pt reporting overall mood is stable and that stress/anxiety is continuing to be triggered by situational stressors. Pt reporting good quality/quantity of sleep.  Allowed pt to explore and express thoughts and feelings about stress associated with step-nephew with ASD. Pt states that she is trying to manage his behaviors and has little time for self care because taking care of him is 24/7.  Discussed self care tips when he is sleeping/occupied elsewhere. Discussed importance of respite care.   Discussed pt trying to remodel bathroom: needs to paint wall, place fixtures, then finish trim. Pt puts date of Sunday by 5pm to allow time to meet with friend on Sunday.   Discussed non-medication ways of managing symptoms versus taking medication in the past--had pt review pros/cons of her personal experience.   Pt still having vivid dreams that are waking pt up.  Continued recommendations are as follows: self care behaviors, positive social engagements, focusing on overall work/home/life balance, and  focusing on positive physical and emotional wellness.    Suicidal/Homicidal: No  Therapist Response: Michelle Simpson is able to tell the story of her anxiety complete with attempts to resolve it and understand suggestions of others. Michelle Simpson is able to identify the major life conflicts from the past and present that form basis for current anxiety that she is experiencing. Michelle Simpson is able to explore traumas/grief from the past and articulate overall psychological impact. These behaviors are reflective of both personal growth and progress. Treatment to continue as indicated.  Plan: Return again in 3 weeks.  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

## 2021-03-20 ENCOUNTER — Ambulatory Visit
Admission: RE | Admit: 2021-03-20 | Discharge: 2021-03-20 | Disposition: A | Discharge status: Home / Self Care | Payer: 59 | Source: Ambulatory Visit | Attending: Adult Health | Admitting: Adult Health

## 2021-03-20 ENCOUNTER — Other Ambulatory Visit: Payer: Self-pay

## 2021-03-20 DIAGNOSIS — I10 Essential (primary) hypertension: Secondary | ICD-10-CM | POA: Insufficient documentation

## 2021-03-20 NOTE — Progress Notes (Signed)
See renal ultrasound needs review since I am no longer at practice.

## 2021-03-25 ENCOUNTER — Other Ambulatory Visit: Payer: Self-pay

## 2021-03-25 DIAGNOSIS — N200 Calculus of kidney: Secondary | ICD-10-CM

## 2021-03-26 ENCOUNTER — Other Ambulatory Visit: Payer: Self-pay | Admitting: Family Medicine

## 2021-03-26 DIAGNOSIS — N200 Calculus of kidney: Secondary | ICD-10-CM

## 2021-04-02 ENCOUNTER — Ambulatory Visit (INDEPENDENT_AMBULATORY_CARE_PROVIDER_SITE_OTHER): Payer: 59 | Admitting: Urology

## 2021-04-02 ENCOUNTER — Encounter: Payer: Self-pay | Admitting: Urology

## 2021-04-02 ENCOUNTER — Ambulatory Visit
Admission: RE | Admit: 2021-04-02 | Discharge: 2021-04-02 | Disposition: A | Discharge status: Home / Self Care | Payer: 59 | Attending: Urology | Admitting: Urology

## 2021-04-02 ENCOUNTER — Other Ambulatory Visit: Payer: Self-pay

## 2021-04-02 ENCOUNTER — Ambulatory Visit
Admission: RE | Admit: 2021-04-02 | Discharge: 2021-04-02 | Disposition: A | Discharge status: Home / Self Care | Payer: 59 | Source: Ambulatory Visit | Attending: Urology | Admitting: Urology

## 2021-04-02 VITALS — BP 152/89 | HR 73 | Ht 67.0 in | Wt 214.0 lb

## 2021-04-02 DIAGNOSIS — N200 Calculus of kidney: Secondary | ICD-10-CM

## 2021-04-02 NOTE — Progress Notes (Signed)
04/02/2021 9:54 AM   Michelle Simpson 1971/10/28 [id number]  Referring provider: Margo Common, PA-C [address] [place],  [place] [address]  Chief Complaint  Patient presents with  . Nephrolithiasis    HPI: 50 year old female who presents today for further evaluation of an incidental right lower pole kidney stone.  She underwent renal ultrasound duplex on 03/21/2019 which showed no evidence of renal artery stenosis or obstruction.  Incidentally, she was found of a small 6 mm right lower pole kidney stone.  In comparison to previous imaging, she did have a CT scan in 2013 which showed "right nephrolithiasis".  I was able to review these images personally today and the stone of the time measured about 3.5 mm.  Presumably this is the same stone.  She denies any overt flank pain but she does have chronic back pain as well as pelvic pain from her history of endometriosis.  She also reports today she has intermittent urinary symptoms, sometimes she feels like she can empty and other times she has trouble going to the bathroom.  She denies any urgency or urge incontinence.  She occasionally weeks when she laughs coughs and sneeze but this is not bothersome to her.  No dysuria, gross hematuria or history of flank pain.  No personal history of kidney stones prior to this.    PMH: Past Medical History:  Diagnosis Date  . B12 deficiency   . Degenerative disc disease, lumbar   . Depression   . DJD (degenerative joint disease)   . Eczema   . Edema   . Endometriosis   . High mean corpuscular hemoglobin concentration (MCHC)   . History of DVT (deep vein thrombosis)   . Hypertension   . IBS (irritable bowel syndrome)   . Osteoarthritis of right hip   . Panic attack   . Pernicious anemia     Surgical History: Past Surgical History:  Procedure Laterality Date  . ABDOMINAL HYSTERECTOMY    . ANKLE SURGERY Right   . ANKLE SURGERY Right    plate placed  . Bilateral knee RFA  nerve ablation @ [facility] hospitals    . CHOLECYSTECTOMY    . KNEE SURGERY Left 2012  . LACRIMAL DUCT RECONSTRUCTION    . LAPAROSCOPY    . miscarriage D&C  1995  . TYMPANOSTOMY TUBE PLACEMENT  1977    Home Medications:  Allergies as of 04/02/2021      Reactions   Atenolol Hives   Urticaria.   Gabapentin Swelling   Nortriptyline Swelling   Amlodipine Swelling   Ibuprofen Other (See Comments)   With prolonged use swelling, HTN and congestion.   Latex Hives   Urticaria.   Meloxicam    Other reaction(s): Other (See Comments) Upset stomach.   Metoprolol Hives   Nsaids Swelling   Paxil Cr [paroxetine Hcl Er] Other (See Comments)   hallucination      Medication List       Accurate as of Apr 02, 2021  9:54 AM. If you have any questions, ask your nurse or doctor.        busPIRone 7.5 MG tablet Commonly known as: BUSPAR Take 7.5 mg by mouth 2 (two) times daily.   hydrochlorothiazide 25 MG tablet Commonly known as: HYDRODIURIL Take 1 tablet (25 mg total) by mouth daily.   hydrOXYzine 10 MG tablet Commonly known as: ATARAX/VISTARIL Take 1 tablet (10 mg total) by mouth 3 (three) times daily as needed.   losartan 50 MG tablet Commonly  known as: Cozaar Take 0.5 tablets (25 mg total) by mouth daily.       Allergies:  Allergies  Allergen Reactions  . Atenolol Hives    Urticaria.  . Gabapentin Swelling  . Nortriptyline Swelling  . Amlodipine Swelling  . Ibuprofen Other (See Comments)    With prolonged use swelling, HTN and congestion.  . Latex Hives    Urticaria.  . Meloxicam     Other reaction(s): Other (See Comments) Upset stomach.  . Metoprolol Hives  . Nsaids Swelling  . Paxil Cr [Paroxetine Hcl Er] Other (See Comments)    hallucination    Family History: Family History  Problem Relation Age of Onset  . Hypertension Father   . Hypertension Mother   . Deep vein thrombosis Mother   . Narcolepsy Mother   . Anxiety disorder Mother   . Hypertension Maternal  Grandmother   . Hypertension Paternal Grandmother   . Hypertension Brother   . Heart attack Brother   . Liver disease Brother     Social History:  reports that she has been smoking cigarettes. She has a 20.00 pack-year smoking history. She has never used smokeless tobacco. She reports previous alcohol use. She reports that she does not use drugs.   Physical Exam: BP (!) 152/89   Pulse 73   Ht 5\' 7"  (1.702 m)   Wt 214 lb (97.1 kg)   BMI 33.52 kg/m   Constitutional:  Alert and oriented, No acute distress. HEENT: [place] AT, moist mucus membranes.  Trachea midline, no masses. Cardiovascular: No clubbing, cyanosis, or edema. Respiratory: Normal respiratory effort, no increased work of breathing. Skin: No rashes, bruises or suspicious lesions. Neurologic: Grossly intact, no focal deficits, moving all 4 extremities. Psychiatric: Normal mood and affect.  Laboratory Data: Lab Results  Component Value Date   WBC 11.1 (H) 03/05/2021   HGB 16.1 (H) 03/05/2021   HCT 46.0 03/05/2021   MCV 91 03/05/2021   PLT 323 03/05/2021    Lab Results  Component Value Date   CREATININE 0.77 03/05/2021    Lab Results  Component Value Date   HGBA1C 5.4 12/17/2020    Urinalysis Negative  Pertinent Imaging: Duplex renal ultrasound images personally reviewed as well as CT scan from 2013 as outlined above.   Assessment & Plan:    1. Right kidney stone Incidental kidney stone, asymptomatic  We discussed that ultrasound can often overestimate or underestimate the size of the stone.  Based on her images from 2013, the stone was about 3.5 mm at the time.  We will get a KUB today to assess whether or not there is been any significant interval growth.  That the stone is relatively small and stable in size, would recommend conservative management.  We discussed general stone prevention techniques including drinking plenty water with goal of producing 2.5 L urine daily, increased citric acid intake,  avoidance of high oxalate containing foods, and decreased salt intake.  Information about dietary recommendations given today.   - Urinalysis, Complete  Hollice Espy, MD  [facility] [address] [place], [place] [address] [phone]

## 2021-04-03 ENCOUNTER — Other Ambulatory Visit: Payer: Self-pay | Admitting: Family Medicine

## 2021-04-03 DIAGNOSIS — F32A Depression, unspecified: Secondary | ICD-10-CM

## 2021-04-03 DIAGNOSIS — Z634 Disappearance and death of family member: Secondary | ICD-10-CM

## 2021-04-03 NOTE — Telephone Encounter (Signed)
Medication Refill - Medication:   busPIRone (BUSPAR) 7.5 MG tablet  Has the patient contacted their pharmacy? Yes.  Pt stated the pharmacy sent over the request but has heard no response, only 2 pills left.   Preferred Pharmacy (with phone number or street name):   [id number] - [address], [place]  [place] [address]  Phone: [phone] Fax: [phone]    Agent: Please be advised that RX refills may take up to 3 business days. We ask that you follow-up with your pharmacy.

## 2021-04-03 NOTE — Telephone Encounter (Signed)
Please review for refill.  Listed as historical med. Next OV: 04/11/21

## 2021-04-04 LAB — URINALYSIS, COMPLETE
Bilirubin, UA: NEGATIVE
Glucose, UA: NEGATIVE
Ketones, UA: NEGATIVE
Leukocytes,UA: NEGATIVE
Nitrite, UA: NEGATIVE
Protein,UA: NEGATIVE
Specific Gravity, UA: 1.015 (ref 1.005–1.030)
Urobilinogen, Ur: 0.2 mg/dL (ref 0.2–1.0)
pH, UA: 7 (ref 5.0–7.5)

## 2021-04-04 LAB — MICROSCOPIC EXAMINATION: Bacteria, UA: NONE SEEN

## 2021-04-07 ENCOUNTER — Telehealth: Payer: Self-pay

## 2021-04-07 NOTE — Telephone Encounter (Signed)
-----   Message from Hollice Espy, MD sent at 04/07/2021  9:00 AM EDT ----- [name] is not visible on KUB, likely very small.  Recommend observation.  Hollice Espy, MD

## 2021-04-07 NOTE — Telephone Encounter (Signed)
Pt called stating that she has been out of this medication since 04/04/21. Pt is requesting to have this sent over asap. Please advise.

## 2021-04-08 MED ORDER — BUSPIRONE HCL 7.5 MG PO TABS: 7.5000 mg | ORAL_TABLET | Freq: Two times a day (BID) | ORAL | 0 refills | Status: DC

## 2021-04-09 ENCOUNTER — Ambulatory Visit: Payer: Self-pay | Admitting: Urology

## 2021-04-11 ENCOUNTER — Ambulatory Visit (INDEPENDENT_AMBULATORY_CARE_PROVIDER_SITE_OTHER): Payer: 59 | Admitting: Family Medicine

## 2021-04-11 ENCOUNTER — Other Ambulatory Visit: Payer: Self-pay

## 2021-04-11 ENCOUNTER — Encounter: Payer: Self-pay | Admitting: Family Medicine

## 2021-04-11 VITALS — BP 149/98 | HR 75 | Temp 98.1°F | Resp 16 | Wt 216.2 lb

## 2021-04-11 DIAGNOSIS — Z634 Disappearance and death of family member: Secondary | ICD-10-CM | POA: Diagnosis not present

## 2021-04-11 DIAGNOSIS — R5383 Other fatigue: Secondary | ICD-10-CM

## 2021-04-11 DIAGNOSIS — I10 Essential (primary) hypertension: Secondary | ICD-10-CM

## 2021-04-11 DIAGNOSIS — G4710 Hypersomnia, unspecified: Secondary | ICD-10-CM

## 2021-04-11 MED ORDER — VALSARTAN 80 MG PO TABS
80.0000 mg | ORAL_TABLET | Freq: Every day | ORAL | 0 refills | Status: DC
Start: 2021-04-11 — End: 2021-04-21

## 2021-04-11 NOTE — Progress Notes (Signed)
Established patient visit   [name]   DOB: 03-17-1971   50 y.o. Female  MRN: [id number] Visit Date: 04/11/2021  Today's healthcare provider: Lelon Huh, MD   Chief Complaint  Patient presents with  . Fatigue   Subjective    HPI  Follow up for fatigue:  The patient was last seen for this 1 month ago (seen by Laverna Peace, NP. During that visit labs were ordered and were normal. Xrays shows sign of chronic bronchitis due to smoking.Recommned trying a COPD inhaler like Advair 250 disc1 puff twice a day. Patient declined.   She reports good compliance with treatment.  Patient has an appointment to see a sleep doctor and have an Echocardiogram next week. Since that visit the cardiologist discontinued bisoprolol due to bradycardia. Prior to that losartan had been reduced from 50 to 25mg  daily.  She feels that condition is slightly improved. She is not having side effects.    -----------------------------------------------------------------------------------------      Medications: Outpatient Medications Prior to Visit  Medication Sig  . busPIRone (BUSPAR) 7.5 MG tablet Take 1 tablet (7.5 mg total) by mouth 2 (two) times daily.  . hydrochlorothiazide (HYDRODIURIL) 25 MG tablet Take 1 tablet (25 mg total) by mouth daily.  . hydrOXYzine (ATARAX/VISTARIL) 10 MG tablet Take 1 tablet (10 mg total) by mouth 3 (three) times daily as needed.  Marland Kitchen losartan (COZAAR) 50 MG tablet Take 0.5 tablets (25 mg total) by mouth daily.   No facility-administered medications prior to visit.    Review of Systems  Constitutional: Positive for fatigue. Negative for appetite change, chills and fever.  Respiratory: Negative for chest tightness and shortness of breath.   Cardiovascular: Negative for chest pain and palpitations.  Gastrointestinal: Negative for abdominal pain, nausea and vomiting.  Musculoskeletal: Positive for back pain.  Neurological: Negative for dizziness  and weakness.       Objective    BP (!) 149/98 (BP Location: Right Arm, Patient Position: Sitting, Cuff Size: Large)   Pulse 75   Temp 98.1 F (36.7 C) (Temporal)   Resp 16   Wt 216 lb 3.2 oz (98.1 kg)   BMI 33.86 kg/m     Physical Exam   General appearance: Obese female, cooperative and in no acute distress Head: Normocephalic, without obvious abnormality, atraumatic Respiratory: Respirations even and unlabored, normal respiratory rate Extremities: All extremities are intact.  Skin: Skin color, texture, turgor normal. No rashes seen  Psych: Appropriate mood and affect. Neurologic: Mental status: Alert, oriented to person, place, and time, thought content appropriate.    Assessment & Plan     1. Fatigue, unspecified type Slightly improved. Has been taken off betablocker since last visit which likely helped. Also strongly suspect OSA. Has also been scheduled for echo by cardiology.   2. Excessive sleepiness Is scheduled for sleep study.   3. Bereavement She feels buspirone has been very helpful and wishes to continue current regiment.   4. Primary hypertension Up since bisoprolol has been stopped and losartan was discontinued. Change losartan to valsartan (DIOVAN) 80 MG tablet; Take 1 tablet (80 mg total) by mouth daily. Continue hctz.        The entirety of the information documented in the History of Present Illness, Review of Systems and Physical Exam were personally obtained by me. Portions of this information were initially documented by the CMA and reviewed by me for thoroughness and accuracy.      Lelon Huh, MD  [place] [phone] (phone) [phone] (fax)  [place]

## 2021-04-11 NOTE — Patient Instructions (Addendum)
   Stop losartan and start valsartan one tablet daily in its place.    [name], NP will be joining [facility] in July 2022. You can call in the practice at the end of June to schedule an appointment to follow up with Ms. Michelle Simpson

## 2021-04-14 NOTE — Progress Notes (Signed)
@Patient  ID: Michelle Simpson, female    DOB: [birth year], 49 y.o.   MRN: [id number]  Chief Complaint  Patient presents with  . Consult    10- 12 hours of sleep, still wake up not refreshed.     Referring provider: Kate Sable, MD  HPI: 50 year old female, current smoker. PMH significant for hypertension, allergic rhinitis, IBS, factor V leiden mutation, b12 deficiency, OA, DDD, endometriosis, hyperlipidemia.  04/15/2021 Patient presents today for sleep consult. She has symptoms of snoring, restless sleep and daytime fatigue. Her partner has told her in the past that she snores loudly. Her sleep worsened after loss of her partner past away in December. She has recurrent nightmares specific to his passing, she found him not breathing and had to perform CPR. She is seeing a psychiatrist and therapist once a month. She has taken trazodone in the past to help with her insomnia but states that it "knocked her out too deeply". She was recently taking low dose Remeron but she was taken off of it due to bradycardia.  A typical bedtime is between 11-12am. It takes her an hour to fall asleep. She wakes up frequently to use the restroom. She takes diuretic in the morning. She gets out of bed 7:30am during the work week. On weekends she will get 12-14 hours of sleep and still feels tired. She will lay on the couch the rest of the day. She will fall asleep for a couple of hours during the day. She cares for her former boyfriends special needs nephew. Her mother had narcolepsy. Denies narcolepsy symtpoms sleep walking or cataplexy. She will eat sugar in the middle of the night when she wakes up to use the restroom. She has lost 30 lbs related to her grief. She takes buspar 7.5mg  twice a day, if she does not take this medication she is more wheepy. She is also on as needed atarax which she has only taken three times.   Sleep questionnaire: Previous sleep studies- None  Symptoms- Loud snoring, restless sleep,  daytime fatigue  Bedtime- 11pm-12am Time to fall asleep- 1 hour Nocturnal awakenings- 3-4 Out of bed in morning- 7:30am  Weight changes- lost 30 lbs  Epworth score-9  Allergies  Allergen Reactions  . Atenolol Hives    Urticaria.  . Gabapentin Swelling  . Nortriptyline Swelling  . Amlodipine Swelling  . Ibuprofen Other (See Comments)    With prolonged use swelling, HTN and congestion.  . Latex Hives    Urticaria.  . Meloxicam     Other reaction(s): Other (See Comments) Upset stomach.  . Metoprolol Hives  . Nsaids Swelling  . Paxil Cr [Paroxetine Hcl Er] Other (See Comments)    hallucination    Immunization History  Administered Date(s) Administered  . Influenza-Unspecified 11/17/2016  . Moderna SARS-COV2 Booster Vaccination 11/22/2020  . Moderna Sars-Covid-2 Vaccination 02/20/2020, 03/12/2020    Past Medical History:  Diagnosis Date  . B12 deficiency   . Degenerative disc disease, lumbar   . Depression   . DJD (degenerative joint disease)   . Eczema   . Edema   . Endometriosis   . High mean corpuscular hemoglobin concentration (MCHC)   . History of DVT (deep vein thrombosis)   . Hypertension   . IBS (irritable bowel syndrome)   . Osteoarthritis of right hip   . Panic attack   . Pernicious anemia     Tobacco History: Social History   Tobacco Use  Smoking Status Current Every Day  Smoker  . Packs/day: 1.50  . Years: 20.00  . Pack years: 30.00  . Types: Cigarettes  Smokeless Tobacco Never Used  Tobacco Comment   1.5 ppd 04/15/2021   Ready to quit: Not Answered Counseling given: Not Answered Comment: 1.5 ppd 04/15/2021   Outpatient Medications Prior to Visit  Medication Sig Dispense Refill  . busPIRone (BUSPAR) 7.5 MG tablet Take 1 tablet (7.5 mg total) by mouth 2 (two) times daily. 180 tablet 0  . hydrochlorothiazide (HYDRODIURIL) 25 MG tablet Take 1 tablet (25 mg total) by mouth daily. 90 tablet 0  . hydrOXYzine (ATARAX/VISTARIL) 10 MG tablet Take  1 tablet (10 mg total) by mouth 3 (three) times daily as needed. 30 tablet 0  . valsartan (DIOVAN) 80 MG tablet Take 1 tablet (80 mg total) by mouth daily. (Patient not taking: Reported on 04/15/2021) 90 tablet 0   No facility-administered medications prior to visit.   Review of Systems  Review of Systems  Constitutional: Positive for fatigue.  HENT: Negative.   Respiratory: Negative for cough, chest tightness and shortness of breath.   Cardiovascular: Positive for leg swelling.   Physical Exam  BP (!) 150/90 (BP Location: Left Arm, Patient Position: Sitting, Cuff Size: Normal)   Pulse 74   Temp 98.2 F (36.8 C) (Temporal)   Ht 5\' 7"  (1.702 m)   Wt 213 lb 6.4 oz (96.8 kg)   SpO2 97%   BMI 33.42 kg/m  Physical Exam Constitutional:      Appearance: Normal appearance.  HENT:     Head: Normocephalic and atraumatic.  Cardiovascular:     Rate and Rhythm: Normal rate and regular rhythm.     Comments: Trace LLE edema  Pulmonary:     Effort: Pulmonary effort is normal.     Breath sounds: No wheezing or rhonchi.  Musculoskeletal:        General: Normal range of motion.  Skin:    General: Skin is warm and dry.  Neurological:     General: No focal deficit present.     Mental Status: She is alert and oriented to person, place, and time. Mental status is at baseline.  Psychiatric:        Mood and Affect: Mood normal.        Behavior: Behavior normal.        Thought Content: Thought content normal.        Judgment: Judgment normal.      Lab Results:  CBC    Component Value Date/Time   WBC 11.1 (H) 03/05/2021 1526   WBC 14.8 (H) 10/20/2017 1324   RBC 5.08 03/05/2021 1526   RBC 5.15 10/20/2017 1324   HGB 16.1 (H) 03/05/2021 1526   HCT 46.0 03/05/2021 1526   PLT 323 03/05/2021 1526   MCV 91 03/05/2021 1526   MCV 93 09/22/2012 1600   MCH 31.7 03/05/2021 1526   MCH 30.4 10/20/2017 1324   MCHC 35.0 03/05/2021 1526   MCHC 33.4 10/20/2017 1324   RDW 14.1 03/05/2021 1526    RDW 14.2 09/22/2012 1600   LYMPHSABS 1.5 03/05/2021 1526   LYMPHSABS 2.8 09/22/2012 1600   MONOABS 0.7 10/20/2017 1324   MONOABS 0.9 09/22/2012 1600   EOSABS 0.1 03/05/2021 1526   EOSABS 0.1 09/22/2012 1600   BASOSABS 0.0 03/05/2021 1526   BASOSABS 0.1 09/22/2012 1600    BMET    Component Value Date/Time   NA 140 03/05/2021 1526   NA 138 09/22/2012 1600   K 4.5 03/05/2021  1526   K 3.9 09/22/2012 1600   CL 100 03/05/2021 1526   CL 103 09/22/2012 1600   CO2 24 03/05/2021 1526   CO2 23 09/22/2012 1600   GLUCOSE 106 (H) 03/05/2021 1526   GLUCOSE 136 (H) 10/20/2017 1324   GLUCOSE 78 09/22/2012 1600   BUN 7 03/05/2021 1526   BUN 12 09/22/2012 1600   CREATININE 0.77 03/05/2021 1526   CREATININE 0.86 09/22/2012 1600   CALCIUM 10.0 03/05/2021 1526   CALCIUM 9.0 09/22/2012 1600   GFRNONAA 107 12/17/2020 0917   GFRNONAA >60 09/22/2012 1600   GFRAA 123 12/17/2020 0917   GFRAA >60 09/22/2012 1600    BNP No results found for: BNP  ProBNP No results found for: PROBNP  Imaging: DG Abd 1 View  Result Date: 04/04/2021 CLINICAL DATA:  Right-sided renal calculi EXAM: ABDOMEN - 1 VIEW COMPARISON:  03/20/2021 FINDINGS: Two supine frontal views of the abdomen and pelvis are obtained. No radiopaque urinary tract calculi are identified. Specifically, the right renal calculus referenced on previous ultrasound is not identified on this exam. Bowel gas pattern is unremarkable. No abdominal masses. Mild lower lumbar spondylosis. IMPRESSION: 1. No radiopaque urinary tract calculi. Electronically Signed   By: Randa Ngo M.D.   On: 04/04/2021 08:53   US Renal Artery Stenosis  Result Date: 03/20/2021 CLINICAL DATA:  50 year old female with uncontrolled hypertension EXAM: RENAL/URINARY TRACT ULTRASOUND RENAL DUPLEX DOPPLER ULTRASOUND COMPARISON:  None. FINDINGS: Right Kidney: Length: 10.0 x 4.4 x 4.4 cm for a total volume of 101 mL. Echogenicity within normal limits. No soft tissue mass or  hydronephrosis visualized. Approximately 6 mm echogenic focus present within the lower pole consistent with nonobstructing nephrolithiasis. Left Kidney: Length: 9.1 x 5.2 x 4.9 cm for a total volume of 120.3 mL. Echogenicity within normal limits. No mass or hydronephrosis visualized. Bladder:  Within normal limits RENAL DUPLEX ULTRASOUND Right Renal Artery Velocities: Origin:  137 cm/sec Mid:  118 cm/sec Hilum:  73 cm/sec Interlobar:  37 cm/sec Arcuate:  37 cm/sec Left Renal Artery Velocities: Origin:  117 cm/sec Mid:  157 cm/sec Hilum:  110 cm/sec Interlobar:  38 cm/sec Arcuate:  25 cm/sec Aortic Velocity:  95 cm/sec Right Renal-Aortic Ratios: Origin: 1.5 Mid:  1.2 Hilum: 0.8 Interlobar: 0.4 Arcuate: 0.4 Left Renal-Aortic Ratios: Origin: 1.2 Mid: 1.7 Hilum: 1.2 Interlobar: 0.4 Arcuate: 0.3 IMPRESSION: 1. No evidence of hemodynamically significant renal artery stenosis. 2. No evidence of hydronephrosis. 3. Small 6 mm nonobstructing stone in the lower pole of the right kidney. Signed, Criselda Peaches, MD, [facility] Electronically Signed   By: Jacqulynn Cadet M.D.   On: 03/20/2021 13:54     Assessment & Plan:   Snoring - Patient reports symptoms of insomnia, snoring, restless sleep and daytime sleepiness. Epworth 9/24. Sleep worsened after losing her partner in December 2021. She takes Buspar 7.5mg  BID and prn atarax for anxiety. In the past she has been on trazodone for insomnia but it was too strong for her. Ordering HST to assess for underlying sleep apnea. We reviewed risk of untreated sleep apnea including cardiovascular, heart arrhthymias, pulm HTN, DM and stroke. We also briefly reviewed treatment options. If sleep study does not show significant sleep apnea we will need to address sleep hygiene and potential medication adjustments will need to be addressed with psych.   Smoker - Current 1.5 packs per day smoker. She has a 30 pack  year hx. She has no respiratory complaints at  this time. Discussed lung cancer screening referral, patient would like to hold off at this time. Encourage she taper amount she smokes and pick quit date.    Martyn Ehrich, NP 04/15/2021

## 2021-04-15 ENCOUNTER — Ambulatory Visit (INDEPENDENT_AMBULATORY_CARE_PROVIDER_SITE_OTHER): Payer: 59 | Admitting: Primary Care

## 2021-04-15 ENCOUNTER — Encounter: Payer: Self-pay | Admitting: Primary Care

## 2021-04-15 ENCOUNTER — Other Ambulatory Visit: Payer: Self-pay

## 2021-04-15 VITALS — BP 150/90 | HR 74 | Temp 98.2°F | Ht 67.0 in | Wt 213.4 lb

## 2021-04-15 DIAGNOSIS — R0683 Snoring: Secondary | ICD-10-CM | POA: Diagnosis not present

## 2021-04-15 DIAGNOSIS — F17200 Nicotine dependence, unspecified, uncomplicated: Secondary | ICD-10-CM

## 2021-04-15 DIAGNOSIS — F17210 Nicotine dependence, cigarettes, uncomplicated: Secondary | ICD-10-CM | POA: Diagnosis not present

## 2021-04-15 NOTE — Patient Instructions (Signed)
Recommendations: - Establish and follow a normal sleep schedule as much as possible (aim to go to bed at the same times every night and get out of bed at the same time every day) - Recommend you try not to get more than 10 hours of sleep each night. Over sleeping can cause fatigue as well - Aim to get 20-30 mins of physical exercise 5 days a week - Try and get some early morning sunlight to help with fatigue   Orders: - Home sleep study re: snoring/ daytime sleepiness  Follow-up: - 4 week televisit to review sleep study results

## 2021-04-15 NOTE — Assessment & Plan Note (Addendum)
-   Current 1.5 packs per day smoker. She has a 30 pack year hx. She has no respiratory complaints at this time. Discussed lung cancer screening referral, patient would like to hold off at this time. Encourage she taper amount she smokes and pick quit date.

## 2021-04-15 NOTE — Assessment & Plan Note (Addendum)
-   Patient reports symptoms of insomnia, snoring, restless sleep and daytime sleepiness. Epworth 9/24. Sleep worsened after losing her partner in December 2021. She takes Buspar 7.5mg  BID and prn atarax for anxiety. In the past she has been on trazodone for insomnia but it was too strong for her. Ordering HST to assess for underlying sleep apnea. We reviewed risk of untreated sleep apnea including cardiovascular, heart arrhthymias, pulm HTN, DM and stroke. We also briefly reviewed treatment options. If sleep study does not show significant sleep apnea we will need to address sleep hygiene and potential medication adjustments will need to be addressed with psych.

## 2021-04-15 NOTE — Progress Notes (Signed)
Reviewed and agree with assessment/plan.   Chesley Mires, MD [facility] Pulmonary/Critical Care 04/15/2021, 7:03 PM Pager:  [phone]

## 2021-04-17 ENCOUNTER — Other Ambulatory Visit: Payer: Self-pay

## 2021-04-17 ENCOUNTER — Ambulatory Visit (INDEPENDENT_AMBULATORY_CARE_PROVIDER_SITE_OTHER): Payer: 59

## 2021-04-17 DIAGNOSIS — R0602 Shortness of breath: Secondary | ICD-10-CM | POA: Diagnosis not present

## 2021-04-17 LAB — ECHOCARDIOGRAM COMPLETE
AR max vel: 3.06 cm2
AV Area VTI: 3.17 cm2
AV Area mean vel: 2.93 cm2
AV Mean grad: 5 mmHg
AV Peak grad: 10.5 mmHg
Ao pk vel: 1.62 m/s
Area-P 1/2: 3.03 cm2
Calc EF: 51.4 %
S' Lateral: 3 cm
Single Plane A2C EF: 51 %
Single Plane A4C EF: 53.4 %

## 2021-04-21 ENCOUNTER — Other Ambulatory Visit: Payer: Self-pay

## 2021-04-21 ENCOUNTER — Telehealth: Payer: Self-pay

## 2021-04-21 MED ORDER — LOSARTAN POTASSIUM 50 MG PO TABS: 25.0000 mg | ORAL_TABLET | Freq: Every day | ORAL | 3 refills | Status: DC

## 2021-04-21 NOTE — Telephone Encounter (Signed)
Called patient to review Echocardiogram Results.   Patient states that she went to see her PCP and they charged her BP medication from losartan to Valsartan.  She said at her last OV with PCP her BP was 150s/90s - they use the automatic BP cuff.  Patient stated that she has not picked this medication up because she was unsure of the change and would rather Dr. Garen Lah to changed her BP medication if he thought it was needed.   She stated that she is okay with not changing medication but wanted to see what Dr. Thereasa Solo thoughts.

## 2021-04-21 NOTE — Telephone Encounter (Signed)
Was able to return call to Michelle Simpson's with Michelle Simpson recommendations of  "Please have patient continue losartan and HCTZ as prescribed. Frequent BP checks at home recommended. No need to switch medication to valsartan. If her blood pressure is not well controlled, we can always increase dose of losartan. Keep follow-up appointment with myself as scheduled."  Michelle Simpson is very thankful for the return and advice, she stated she prefers Michelle Simpson play with heart meds and not PCP, she will continue as prescribe and will f/u at schedule appt and will bring her BP log with her for review. Nothing further at this time.

## 2021-04-23 ENCOUNTER — Other Ambulatory Visit: Payer: Self-pay

## 2021-04-23 ENCOUNTER — Ambulatory Visit (INDEPENDENT_AMBULATORY_CARE_PROVIDER_SITE_OTHER): Payer: 59 | Admitting: Licensed Clinical Social Worker

## 2021-04-23 DIAGNOSIS — F4310 Post-traumatic stress disorder, unspecified: Secondary | ICD-10-CM

## 2021-04-23 DIAGNOSIS — Z634 Disappearance and death of family member: Secondary | ICD-10-CM | POA: Diagnosis not present

## 2021-04-23 NOTE — Progress Notes (Signed)
Outpatient Therapy in office  Note  I connected with CHLOEANN ALFRED on 04/23/21 at  1:00 PM EDT and verified that I am speaking with the correct person using two identifiers.  Location: [name]  Provider: [facility]    I discussed the assessment and treatment plan with the patient. The patient was provided an opportunity to ask questions and all were answered. The patient agreed with the plan and demonstrated an understanding of the instructions.   I provided 60 minutes of face-to-face time during this encounter.   Emmanuelle Hibbitts R Caprisha Bridgett, LCSW    THERAPIST PROGRESS NOTE  Session Time: 1-2p  Participation Level: Active  Behavioral Response: Neat and Well GroomedAlertAnxious and Depressed  Type of Therapy: Individual Therapy  Treatment Goals addressed: Anxiety and Coping  Interventions: CBT, DBT, Supportive and Other: grief focused  Summary: Michelle Simpson is a 50 y.o. female who presents with symptoms consistent with PTSD.  Pt reports that mood is more stable this session than at last session. Pt reports that she is being intentional about using coping skills that are helpful to manage stress and anxiety.  Pt reports that she is getting good quality and quantity of sleep.  Allowed pt to explore and express thoughts and feelings associated with recent life situations and external stressors. Pt feels that nephews care continues to be a stressor for her. Pt did find day program for nephew--nephew got upset and thought that [facility] was putting him in a group home. Once he realized that it was a day program, he was glad to have the opportunity to interact with peers. This made Michelle Simpson happy.   Explored personal goals:  Michelle Simpson is happy about getting the wall painted. Still has the items outside of the wall that she needs to get things done. Michelle Simpson has elephant ears that she needs to pick up at her friend's--and some landscape rocks that she needs to pick up. These items are free and pt wants to  take advantage of that and use them. Pt also has a goal to visit her great Aunt at [employer] in October.   Pt has goals to go and see a movie soon as part of her self care plan.  Encouraged pt and offered unconditional positive support and gently holding pt accountable for her goals/behaviors.   Continued recommendations are as follows: self care behaviors, positive social engagements, focusing on overall work/home/life balance, and focusing on positive physical and emotional wellness.   Suicidal/Homicidal: No  Therapist Response: Tenika is able to tell the story of her anxiety complete with attempts to resolve it and understand suggestions of others. Lillar is able to identify the major life conflicts from the past and present that form basis for current anxiety that she is experiencing. [name] is able to explore traumas/grief from the past and articulate overall psychological impact. These behaviors are reflective of both personal growth and progress. Treatment to continue as indicated.  Plan: Return again in 4 weeks.  Diagnosis: Axis I: Post Traumatic Stress Disorder    Axis II: No diagnosis    Michelle Bo Armarion Greek, LCSW 04/23/2021

## 2021-04-29 ENCOUNTER — Ambulatory Visit (INDEPENDENT_AMBULATORY_CARE_PROVIDER_SITE_OTHER): Payer: 59 | Admitting: Psychiatry

## 2021-04-29 ENCOUNTER — Encounter: Payer: Self-pay | Admitting: Psychiatry

## 2021-04-29 ENCOUNTER — Other Ambulatory Visit: Payer: Self-pay

## 2021-04-29 VITALS — BP 175/115 | HR 87 | Temp 97.7°F | Wt 212.2 lb

## 2021-04-29 DIAGNOSIS — F4310 Post-traumatic stress disorder, unspecified: Secondary | ICD-10-CM | POA: Diagnosis not present

## 2021-04-29 DIAGNOSIS — Z634 Disappearance and death of family member: Secondary | ICD-10-CM | POA: Diagnosis not present

## 2021-04-29 NOTE — Progress Notes (Signed)
[facility] MD OP Progress Note  04/29/2021 5:47 PM Michelle Simpson  MRN:  [id number]  Chief Complaint:  Chief Complaint   Follow-up; GRIEF; Anxiety    HPI: Michelle Simpson is a 50 year old Caucasian female, employed, divorced, lives in [place], has a history of PTSD, bereavement, high blood pressure, IBS, osteoarthritis, pernicious anemia was evaluated in office today.  Patient today reports she is currently taking it one day at a time with regards to her grief.  She continues to have certain triggers that can make her grief worse.  She however reports she has been surrounding herself with her social support system and that makes it easier.  She reports she is busy taking care of a lot of things, trying to get guardianship of the estate, trying to find a day program for her boyfriend's nephew who has a disability and who lives with her.  She currently cares for him.  Patient reports sleep as restless.  She does not feel rested when she wakes up in the morning.  She also feels tired during the day.  She has a sleep study scheduled.  She does have trazodone as needed available from a previous prescription however she does not want to take it however would like to take it on the day of her sleep study.  Patient reports she continues to be in psychotherapy sessions and that is beneficial.  She denies any suicidality, homicidality or perceptual disturbances.  Patient denies any other concerns today.  Visit Diagnosis:    ICD-10-CM   1. PTSD (post-traumatic stress disorder)  F43.10     2. Bereavement  Z63.4       Past Psychiatric History: Reviewed past psychiatric history from progress note on 02/03/2021.  Past trials of Paxil-hallucination.  SSRIs makes her sick.  Past Medical History:  Past Medical History:  Diagnosis Date   B12 deficiency    Degenerative disc disease, lumbar    Depression    DJD (degenerative joint disease)    Eczema    Edema    Endometriosis    High mean corpuscular  hemoglobin concentration (MCHC)    History of DVT (deep vein thrombosis)    Hypertension    IBS (irritable bowel syndrome)    Osteoarthritis of right hip    Panic attack    Pernicious anemia     Past Surgical History:  Procedure Laterality Date   ABDOMINAL HYSTERECTOMY     ANKLE SURGERY Right    ANKLE SURGERY Right    plate placed   Bilateral knee RFA nerve ablation @ [facility]     CHOLECYSTECTOMY     KNEE SURGERY Left 2012   LACRIMAL DUCT RECONSTRUCTION     LAPAROSCOPY     miscarriage D&C  1995   TYMPANOSTOMY [place]    Family Psychiatric History: I have reviewed family psychiatric history from progress note on 02/03/2021  Family History:  Family History  Problem Relation Age of Onset   Hypertension Father    Hypertension Mother    Deep vein thrombosis Mother    Narcolepsy Mother    Anxiety disorder Mother    Hypertension Maternal Grandmother    Hypertension Paternal Grandmother    Hypertension Brother    Heart attack Brother    Liver disease Brother     Social History: Reviewed social history from progress note on 02/03/2021 Social History   Socioeconomic History   Marital status: Divorced    Spouse name: Not on file  Number of children: 1   Years of education: Not on file   Highest education level: Not on file  Occupational History   Not on file  Tobacco Use   Smoking status: Every Day    Packs/day: 1.00    Years: 20.00    Pack years: 20.00    Types: Cigarettes   Smokeless tobacco: Never   Tobacco comments:    1 ppd - reported 04/29/2021  Vaping Use   Vaping Use: Former   Start date: 11/17/2015   Quit date: 01/07/2020  Substance and Sexual Activity   Alcohol use: Not Currently    Alcohol/week: 0.0 standard drinks   Drug use: No   Sexual activity: Never  Other Topics Concern   Not on file  Social History Narrative   Not on file   Social Determinants of Health   Financial Resource Strain: Not on file  Food Insecurity: Not on  file  Transportation Needs: Not on file  Physical Activity: Not on file  Stress: Not on file  Social Connections: Not on file    Allergies:  Allergies  Allergen Reactions   Atenolol Hives    Urticaria.   Gabapentin Swelling   Nortriptyline Swelling   Amlodipine Swelling   Ibuprofen Other (See Comments)    With prolonged use swelling, HTN and congestion.   Latex Hives    Urticaria.   Meloxicam     Other reaction(s): Other (See Comments) Upset stomach.   Metoprolol Hives   Nsaids Swelling   Paxil Cr [Paroxetine Hcl Er] Other (See Comments)    hallucination    Metabolic Disorder Labs: Lab Results  Component Value Date   HGBA1C 5.4 12/17/2020   No results found for: PROLACTIN Lab Results  Component Value Date   CHOL 220 (H) 12/17/2020   TRIG 265 (H) 12/17/2020   HDL 40 12/17/2020   CHOLHDL 4.2 03/20/2016   LDLCALC 133 (H) 12/17/2020   LDLCALC 174 (H) 03/20/2016   Lab Results  Component Value Date   TSH 0.748 03/05/2021   TSH 0.677 12/17/2020    Therapeutic Level Labs: No results found for: LITHIUM No results found for: VALPROATE No components found for:  CBMZ  Current Medications: Current Outpatient Medications  Medication Sig Dispense Refill   busPIRone (BUSPAR) 7.5 MG tablet Take 1 tablet (7.5 mg total) by mouth 2 (two) times daily. 180 tablet 0   hydrochlorothiazide (HYDRODIURIL) 25 MG tablet Take 1 tablet (25 mg total) by mouth daily. 90 tablet 0   hydrOXYzine (ATARAX/VISTARIL) 10 MG tablet Take 1 tablet (10 mg total) by mouth 3 (three) times daily as needed. 30 tablet 0   losartan (COZAAR) 50 MG tablet Take 0.5 tablets (25 mg total) by mouth daily. 45 tablet 3   No current facility-administered medications for this visit.     Musculoskeletal: Strength & Muscle Tone: within normal limits Gait & Station: normal Patient leans: N/A  Psychiatric Specialty Exam: Review of Systems  Psychiatric/Behavioral:  Positive for sleep disturbance.         Grieving  All other systems reviewed and are negative.  Blood pressure (!) 175/115, pulse 87, temperature 97.7 F (36.5 C), temperature source Temporal, weight 212 lb 3.2 oz (96.3 kg).Body mass index is 33.24 kg/m.  General Appearance: Fairly Groomed  Eye Contact:  Fair  Speech:  Clear and Coherent  Volume:  Normal  Mood:   Grieving  Affect:  Appropriate  Thought Process:  Goal Directed and Descriptions of Associations: Intact  Orientation:  Full (Time, Place, and Person)  Thought Content: Logical   Suicidal Thoughts:  No  Homicidal Thoughts:  No  Memory:  Immediate;   Fair Recent;   Fair Remote;   Fair  Judgement:  Good  Insight:  Good  Psychomotor Activity:  Normal  Concentration:  Concentration: Good and Attention Span: Good  Recall:  Good  Fund of Knowledge: Good  Language: Good  Akathisia:  No  Handed:  Right  AIMS (if indicated): done  Assets:  Communication Skills Desire for Improvement Financial Resources/Insurance Housing Social Support Talents/Skills Transportation Vocational/Educational  ADL's:  Intact  Cognition: WNL  Sleep:  Poor   Screenings: GAD-7    Flowsheet Row Office Visit from 04/29/2021 in [facility]  Total GAD-7 Score 5      PHQ2-9    [place] Visit from 04/29/2021 in [facility] Video Visit from 02/26/2021 in [facility] Video Visit from 02/03/2021 in [facility] Office Visit from 12/31/2020 in [facility] Visit from 12/31/2016 in [facility]  PHQ-2 Total Score 2 2 4 2 1   PHQ-9 Total Score 7 10 12 14  --      [place] Office Visit from 04/29/2021 in [facility] from 03/19/2021 in [facility] Video Visit from 02/26/2021 in [facility] No Risk No Risk No Risk         Assessment and Plan: Michelle Simpson is a 50 year old Caucasian female, employed, divorced, lives in [place], has a history of multiple medical problems as well as bereavement, sleep problems was evaluated in office today.  Patient is currently making progress with regards to her grief.  Discussed plan as noted below.  Plan PTSD-improving BuSpar 7.5 mg p.o. twice daily Continue CBT with Ms. Christina Hussami  Bereavement-improving Continue grief counseling Continue CPT  Patient with elevated blood pressure reading today-, blood pressure was repeated however it continued to remain high, has upcoming appointment with cardiologist on Thursday.  Patient advised to follow-up with her providers for management.  Follow-up in clinic in 3 months or sooner if needed.  This note was generated in part or whole with voice recognition software. Voice recognition is usually quite accurate but there are transcription errors that can and very often do occur. I apologize for any typographical errors that were not detected and corrected.      Ursula Alert, MD 04/30/2021, 11:19 AM

## 2021-05-02 ENCOUNTER — Encounter: Payer: Self-pay | Admitting: Cardiology

## 2021-05-02 ENCOUNTER — Other Ambulatory Visit: Payer: Self-pay

## 2021-05-02 ENCOUNTER — Ambulatory Visit (INDEPENDENT_AMBULATORY_CARE_PROVIDER_SITE_OTHER): Payer: 59 | Admitting: Cardiology

## 2021-05-02 VITALS — BP 130/94 | HR 78 | Ht 67.0 in | Wt 212.0 lb

## 2021-05-02 DIAGNOSIS — I10 Essential (primary) hypertension: Secondary | ICD-10-CM

## 2021-05-02 DIAGNOSIS — R5383 Other fatigue: Secondary | ICD-10-CM | POA: Diagnosis not present

## 2021-05-02 DIAGNOSIS — F17200 Nicotine dependence, unspecified, uncomplicated: Secondary | ICD-10-CM

## 2021-05-02 MED ORDER — LOSARTAN POTASSIUM 50 MG PO TABS: 50.0000 mg | ORAL_TABLET | Freq: Every day | ORAL | 3 refills | Status: DC

## 2021-05-02 NOTE — Progress Notes (Signed)
Cardiology Office Note:    Date:  05/02/2021   ID:  Aura Camps, DOB 1971-01-09, MRN [id number]  PCP:  Chrismon, Dennis E, [place]  Cardiologist:  Kate Sable, MD  Advanced Practice Provider:  No care team member to display Electrophysiologist:  None       Referring MD: Margo Common, PA-C   Chief Complaint  Patient presents with   Other    Follow up post ECHO. Meds reviewed verbally with patient.      History of Present Illness:    Michelle Simpson is a 50 y.o. female with a hx of hypertension, current smoker x25+ years, who presents for follow-up.    She was last seen due to bradycardia and fatigue.  Her symptoms of low heart rates improved with reducing bisoprolol.  Bisoprolol was stopped after last visit.  Echocardiogram was also obtained to evaluate shortness of breath/fatigue symptoms.  Endorsed snoring, and was referred to pulmonary medicine for sleep apnea evaluation.    Patient's symptoms of fatigue overall are improved, blood pressures have been elevated of late.  Has no new concerns at this time.    Past Medical History:  Diagnosis Date   B12 deficiency    Degenerative disc disease, lumbar    Depression    DJD (degenerative joint disease)    Eczema    Edema    Endometriosis    High mean corpuscular hemoglobin concentration (MCHC)    History of DVT (deep vein thrombosis)    Hypertension    IBS (irritable bowel syndrome)    Osteoarthritis of right hip    Panic attack    Pernicious anemia     Past Surgical History:  Procedure Laterality Date   ABDOMINAL HYSTERECTOMY     ANKLE SURGERY Right    ANKLE SURGERY Right    plate placed   Bilateral knee RFA nerve ablation @ [place]     KNEE SURGERY Left 2012   LACRIMAL DUCT RECONSTRUCTION     LAPAROSCOPY     miscarriage D&C  1995   TYMPANOSTOMY TUBE PLACEMENT  1977    Current Medications: Current Meds  Medication Sig   busPIRone  (BUSPAR) 7.5 MG tablet Take 1 tablet (7.5 mg total) by mouth 2 (two) times daily.   hydrochlorothiazide (HYDRODIURIL) 25 MG tablet Take 1 tablet (25 mg total) by mouth daily.   hydrOXYzine (ATARAX/VISTARIL) 10 MG tablet Take 1 tablet (10 mg total) by mouth 3 (three) times daily as needed.   [DISCONTINUED] losartan (COZAAR) 50 MG tablet Take 0.5 tablets (25 mg total) by mouth daily.     Allergies:   Atenolol, Gabapentin, Nortriptyline, Amlodipine, Ibuprofen, Latex, Meloxicam, Metoprolol, Nsaids, and Paxil cr [paroxetine hcl er]   Social History   Socioeconomic History   Marital status: Divorced    Spouse name: Not on file   Number of children: 1   Years of education: Not on file   Highest education level: Not on file  Occupational History   Not on file  Tobacco Use   Smoking status: Every Day    Packs/day: 1.00    Years: 20.00    Pack years: 20.00    Types: Cigarettes   Smokeless tobacco: Never   Tobacco comments:    1 ppd - reported 04/29/2021  Vaping Use   Vaping Use: Former   Start date: 11/17/2015   Quit date: 01/07/2020  Substance and Sexual Activity  Alcohol use: Not Currently    Alcohol/week: 0.0 standard drinks   Drug use: No   Sexual activity: Never  Other Topics Concern   Not on file  Social History Narrative   Not on file   Social Determinants of Health   Financial Resource Strain: Not on file  Food Insecurity: Not on file  Transportation Needs: Not on file  Physical Activity: Not on file  Stress: Not on file  Social Connections: Not on file     Family History: The patient's family history includes Anxiety disorder in her mother; Deep vein thrombosis in her mother; Heart attack in her brother; Hypertension in her brother, father, maternal grandmother, mother, and paternal grandmother; Liver disease in her brother; Narcolepsy in her mother.  ROS:   Please see the history of present illness.     All other systems reviewed and are  negative.  EKGs/Labs/Other Studies Reviewed:    The following studies were reviewed today:   EKG:  EKG not ordered today.    Recent Labs: 03/05/2021: ALT 18; BUN 7; Creatinine, Ser 0.77; Hemoglobin 16.1; Platelets 323; Potassium 4.5; Sodium 140; TSH 0.748  Recent Lipid Panel    Component Value Date/Time   CHOL 220 (H) 12/17/2020 0917   TRIG 265 (H) 12/17/2020 0917   HDL 40 12/17/2020 0917   CHOLHDL 4.2 03/20/2016 1010   LDLCALC 133 (H) 12/17/2020 0917     Risk Assessment/Calculations:      Physical Exam:    VS:  BP (!) 130/94 (BP Location: Left Arm, Patient Position: Sitting, Cuff Size: Normal)   Pulse 78   Ht 5\' 7"  (1.702 m)   Wt 212 lb (96.2 kg)   SpO2 96%   BMI 33.20 kg/m     Wt Readings from Last 3 Encounters:  05/02/21 212 lb (96.2 kg)  04/15/21 213 lb 6.4 oz (96.8 kg)  04/11/21 216 lb 3.2 oz (98.1 kg)     GEN:  Well nourished, well developed in no acute distress HEENT: Normal NECK: No JVD; No carotid bruits LYMPHATICS: No lymphadenopathy CARDIAC: RRR, no murmurs, rubs, gallops RESPIRATORY:  Clear to auscultation without rales, wheezing or rhonchi  ABDOMEN: Soft, non-tender, non-distended MUSCULOSKELETAL:  No edema; No deformity  SKIN: Warm and dry NEUROLOGIC:  Alert and oriented x 3 PSYCHIATRIC:  Normal affect   ASSESSMENT:    1. Other fatigue   2. Primary hypertension   3. Smoking     PLAN:    In order of problems listed above:  Fatigue, echocardiogram with normal systolic function, EF 60 to 65%, impaired laxation.  Symptoms overall improved with stopping bisoprolol.  Possible sleep apnea may be contributing.  Keep appointment with pulmonary medicine for sleep apnea eval. Hypertension, BP improved but elevated.  Increase losartan to 50 mg daily, continue HCTZ. Current smoker, cessation advised.  Follow-up in 3 months     Medication Adjustments/Labs and Tests Ordered: Current medicines are reviewed at length with the patient today.   Concerns regarding medicines are outlined above.  No orders of the defined types were placed in this encounter.  Meds ordered this encounter  Medications   losartan (COZAAR) 50 MG tablet    Sig: Take 1 tablet (50 mg total) by mouth daily.    Dispense:  90 tablet    Refill:  3     Patient Instructions  Medication Instructions:   Your physician has recommended you make the following change in your medication:    INCREASE your Losartan to 50 MG  once a day.  *If you need a refill on your cardiac medications before your next appointment, please call your pharmacy*   Lab Work: None ordered If you have labs (blood work) drawn today and your tests are completely normal, you will receive your results only by: [place] (if you have MyChart) OR A paper copy in the mail If you have any lab test that is abnormal or we need to change your treatment, we will call you to review the results.   Testing/Procedures: None ordered   Follow-Up: At [facility], you and your health needs are our priority.  As part of our continuing mission to provide you with exceptional heart care, we have created designated Provider Care Teams.  These Care Teams include your primary Cardiologist (physician) and Advanced Practice Providers (APPs -  Physician Assistants and Nurse Practitioners) who all work together to provide you with the care you need, when you need it.  We recommend signing up for the patient portal called "MyChart".  Sign up information is provided on this After Visit Summary.  MyChart is used to connect with patients for Virtual Visits (Telemedicine).  Patients are able to view lab/test results, encounter notes, upcoming appointments, etc.  Non-urgent messages can be sent to your provider as well.   To learn more about what you can do with MyChart, go to [email or web address].    Your next appointment:   3 month(s)  The format for your next appointment:   In Person  Provider:    Kate Sable, MD   Other Instructions    Signed, Kate Sable, MD  05/02/2021 4:43 PM    [place]

## 2021-05-02 NOTE — Patient Instructions (Signed)
Medication Instructions:   Your physician has recommended you make the following change in your medication:    INCREASE your Losartan to 50 MG once a day.  *If you need a refill on your cardiac medications before your next appointment, please call your pharmacy*   Lab Work: None ordered If you have labs (blood work) drawn today and your tests are completely normal, you will receive your results only by: [place] (if you have MyChart) OR A paper copy in the mail If you have any lab test that is abnormal or we need to change your treatment, we will call you to review the results.   Testing/Procedures: None ordered   Follow-Up: At [facility], you and your health needs are our priority.  As part of our continuing mission to provide you with exceptional heart care, we have created designated Provider Care Teams.  These Care Teams include your primary Cardiologist (physician) and Advanced Practice Providers (APPs -  Physician Assistants and Nurse Practitioners) who all work together to provide you with the care you need, when you need it.  We recommend signing up for the patient portal called "MyChart".  Sign up information is provided on this After Visit Summary.  MyChart is used to connect with patients for Virtual Visits (Telemedicine).  Patients are able to view lab/test results, encounter notes, upcoming appointments, etc.  Non-urgent messages can be sent to your provider as well.   To learn more about what you can do with MyChart, go to [email or web address].    Your next appointment:   3 month(s)  The format for your next appointment:   In Person  Provider:   Kate Sable, MD   Other Instructions

## 2021-05-15 ENCOUNTER — Ambulatory Visit: Payer: Self-pay | Admitting: Family Medicine

## 2021-05-21 ENCOUNTER — Ambulatory Visit: Payer: 59 | Admitting: Primary Care

## 2021-05-22 ENCOUNTER — Telehealth: Payer: Self-pay

## 2021-05-22 NOTE — Telephone Encounter (Signed)
Checking on home sleep test that was ordered by Derl Barrow. Thank you!

## 2021-05-22 NOTE — Telephone Encounter (Signed)
I only have 1 machine in [place] and she will need to move out her appt with Beth. I just tried calling her to get her setup for next week and the voice mailbox is full and I couldn't leave message

## 2021-05-23 ENCOUNTER — Ambulatory Visit: Payer: 59 | Admitting: Primary Care

## 2021-05-23 ENCOUNTER — Other Ambulatory Visit: Payer: Self-pay

## 2021-05-28 ENCOUNTER — Telehealth: Payer: Self-pay

## 2021-05-28 ENCOUNTER — Other Ambulatory Visit: Payer: Self-pay | Admitting: Family Medicine

## 2021-05-28 DIAGNOSIS — I10 Essential (primary) hypertension: Secondary | ICD-10-CM

## 2021-05-28 MED ORDER — HYDROCHLOROTHIAZIDE 25 MG PO TABS: 25.0000 mg | ORAL_TABLET | Freq: Every day | ORAL | 0 refills | Status: DC

## 2021-05-28 NOTE — Telephone Encounter (Signed)
[facility] faxed refill request for the following medications:  hydrochlorothiazide (HYDRODIURIL) 25 MG tablet    Please advise.  

## 2021-05-29 ENCOUNTER — Encounter: Payer: Self-pay | Admitting: Family Medicine

## 2021-05-29 ENCOUNTER — Other Ambulatory Visit: Payer: Self-pay

## 2021-05-29 ENCOUNTER — Ambulatory Visit (INDEPENDENT_AMBULATORY_CARE_PROVIDER_SITE_OTHER): Payer: 59 | Admitting: Family Medicine

## 2021-05-29 VITALS — BP 166/99 | HR 73 | Temp 98.6°F | Wt 214.0 lb

## 2021-05-29 DIAGNOSIS — M5416 Radiculopathy, lumbar region: Secondary | ICD-10-CM | POA: Diagnosis not present

## 2021-05-29 DIAGNOSIS — M5136 Other intervertebral disc degeneration, lumbar region: Secondary | ICD-10-CM

## 2021-05-29 DIAGNOSIS — M170 Bilateral primary osteoarthritis of knee: Secondary | ICD-10-CM | POA: Diagnosis not present

## 2021-05-29 DIAGNOSIS — I10 Essential (primary) hypertension: Secondary | ICD-10-CM | POA: Diagnosis not present

## 2021-05-29 MED ORDER — METHOCARBAMOL 500 MG PO TABS: 500.0000 mg | ORAL_TABLET | Freq: Four times a day (QID) | ORAL | 2 refills | Status: AC

## 2021-05-29 MED ORDER — PREDNISONE 10 MG PO TABS: ORAL_TABLET | ORAL | 0 refills | Status: DC

## 2021-05-29 NOTE — Progress Notes (Signed)
Acute Office Visit  Subjective:    Patient ID: Michelle Simpson, female    DOB: [birth year], 50 y.o.   MRN: [id number]  No chief complaint on file.   HPI Patient is in today for back pain.  She states she has had issues with her back for years.  She has been told she has DDD.  She states that she moved in March and had to be doing much more lifting and being on her feet more.  She has history of use of Baclofen and Steroid taper.  She currently uses Tylenol and massage therapy.  She gets little relief.  She has had epidural injections in the back with no relief. She had imaging of her back in 2017 (x-ray) and 2015 (MRI)  Past Medical History:  Diagnosis Date   B12 deficiency    Degenerative disc disease, lumbar    Depression    DJD (degenerative joint disease)    Eczema    Edema    Endometriosis    High mean corpuscular hemoglobin concentration (MCHC)    History of DVT (deep vein thrombosis)    Hypertension    IBS (irritable bowel syndrome)    Osteoarthritis of right hip    Panic attack    Pernicious anemia     Past Surgical History:  Procedure Laterality Date   ABDOMINAL HYSTERECTOMY     ANKLE SURGERY Right    ANKLE SURGERY Right    plate placed   Bilateral knee RFA nerve ablation @ [facility]     CHOLECYSTECTOMY     KNEE SURGERY Left 2012   LACRIMAL DUCT RECONSTRUCTION     LAPAROSCOPY     miscarriage D&C  1995   TYMPANOSTOMY TUBE PLACEMENT  1977    Family History  Problem Relation Age of Onset   Hypertension Father    Hypertension Mother    Deep vein thrombosis Mother    Narcolepsy Mother    Anxiety disorder Mother    Hypertension Maternal Grandmother    Hypertension Paternal Grandmother    Hypertension Brother    Heart attack Brother    Liver disease Brother     Social History   Socioeconomic History   Marital status: Divorced    Spouse name: Not on file   Number of children: 1   Years of education: Not on file   Highest education level: Not on  file  Occupational History   Not on file  Tobacco Use   Smoking status: Every Day    Packs/day: 1.00    Years: 20.00    Pack years: 20.00    Types: Cigarettes   Smokeless tobacco: Never   Tobacco comments:    1 ppd - reported 04/29/2021  Vaping Use   Vaping Use: Former   Start date: 11/17/2015   Quit date: 01/07/2020  Substance and Sexual Activity   Alcohol use: Not Currently    Alcohol/week: 0.0 standard drinks   Drug use: No   Sexual activity: Never  Other Topics Concern   Not on file  Social History Narrative   Not on file   Social Determinants of Health   Financial Resource Strain: Not on file  Food Insecurity: Not on file  Transportation Needs: Not on file  Physical Activity: Not on file  Stress: Not on file  Social Connections: Not on file  Intimate Partner Violence: Not on file    Outpatient Medications Prior to Visit  Medication Sig Dispense Refill   busPIRone (BUSPAR) 7.5  MG tablet Take 1 tablet (7.5 mg total) by mouth 2 (two) times daily. 180 tablet 0   hydrochlorothiazide (HYDRODIURIL) 25 MG tablet Take 1 tablet (25 mg total) by mouth daily. 90 tablet 0   hydrOXYzine (ATARAX/VISTARIL) 10 MG tablet Take 1 tablet (10 mg total) by mouth 3 (three) times daily as needed. 30 tablet 0   losartan (COZAAR) 50 MG tablet Take 1 tablet (50 mg total) by mouth daily. 90 tablet 3   No facility-administered medications prior to visit.    Allergies  Allergen Reactions   Atenolol Hives    Urticaria.   Gabapentin Swelling   Nortriptyline Swelling   Amlodipine Swelling   Ibuprofen Other (See Comments)    With prolonged use swelling, HTN and congestion.   Latex Hives    Urticaria.   Meloxicam     Other reaction(s): Other (See Comments) Upset stomach.   Metoprolol Hives   Nsaids Swelling   Paxil Cr [Paroxetine Hcl Er] Other (See Comments)    hallucination    Review of Systems  Constitutional: Negative.   HENT: Negative.    Respiratory: Negative.     Cardiovascular: Negative.   Genitourinary: Negative.   Musculoskeletal:  Positive for arthralgias and back pain.  Neurological: Negative.       Objective:    Physical Exam Constitutional:      General: She is not in acute distress.    Appearance: She is well-developed.  HENT:     Head: Normocephalic and atraumatic.     Right Ear: Hearing normal.     Left Ear: Hearing normal.     Nose: Nose normal.  Eyes:     General: Lids are normal. No scleral icterus.       Right eye: No discharge.        Left eye: No discharge.     Conjunctiva/sclera: Conjunctivae normal.  Cardiovascular:     Rate and Rhythm: Normal rate and regular rhythm.     Heart sounds: Normal heart sounds.  Pulmonary:     Effort: Pulmonary effort is normal. No respiratory distress.     Breath sounds: Normal breath sounds.  Abdominal:     General: Bowel sounds are normal.     Palpations: Abdomen is soft.  Musculoskeletal:        General: Tenderness present. Normal range of motion.     Comments: Tender left lower back radiating into the left buttock and posterior thigh. SLR's to 90 degrees causes some pain in the left back. DTR's 2+ left knee. Normal sensation.   Skin:    Findings: No lesion or rash.  Neurological:     Mental Status: She is alert and oriented to person, place, and time.     Motor: No weakness.     Gait: Gait normal.     Deep Tendon Reflexes: Reflexes normal.  Psychiatric:        Speech: Speech normal.        Behavior: Behavior normal.        Thought Content: Thought content normal.    BP (!) 166/99 (BP Location: Right Arm, Patient Position: Sitting, Cuff Size: Large)   Pulse 73   Temp 98.6 F (37 C) (Oral)   Wt 214 lb (97.1 kg)   SpO2 98%   BMI 33.52 kg/m  Wt Readings from Last 3 Encounters:  05/29/21 214 lb (97.1 kg)  05/02/21 212 lb (96.2 kg)  04/15/21 213 lb 6.4 oz (96.8 kg)    Health Maintenance Due  Topic Date Due   Pneumococcal Vaccine 51-28 Years old (1 - PCV) Never done    HIV Screening  Never done   Hepatitis C Screening  Never done   TETANUS/TDAP  Never done   Zoster Vaccines- Shingrix (1 of 2) Never done   COLONOSCOPY (Pts 45-25yr Insurance coverage will need to be confirmed)  Never done   MAMMOGRAM  02/05/2021   COVID-19 Vaccine (4 - Booster) 02/20/2021    There are no preventive care reminders to display for this patient.   Lab Results  Component Value Date   TSH 0.748 03/05/2021   Lab Results  Component Value Date   WBC 11.1 (H) 03/05/2021   HGB 16.1 (H) 03/05/2021   HCT 46.0 03/05/2021   MCV 91 03/05/2021   PLT 323 03/05/2021   Lab Results  Component Value Date   NA 140 03/05/2021   K 4.5 03/05/2021   CO2 24 03/05/2021   GLUCOSE 106 (H) 03/05/2021   BUN 7 03/05/2021   CREATININE 0.77 03/05/2021   BILITOT 0.4 03/05/2021   ALKPHOS 98 03/05/2021   AST 12 03/05/2021   ALT 18 03/05/2021   PROT 6.9 03/05/2021   ALBUMIN 4.5 03/05/2021   CALCIUM 10.0 03/05/2021   ANIONGAP 9 10/20/2017   EGFR 94 03/05/2021   Lab Results  Component Value Date   CHOL 220 (H) 12/17/2020   Lab Results  Component Value Date   HDL 40 12/17/2020   Lab Results  Component Value Date   LDLCALC 133 (H) 12/17/2020   Lab Results  Component Value Date   TRIG 265 (H) 12/17/2020   Lab Results  Component Value Date   CHOLHDL 4.2 03/20/2016   Lab Results  Component Value Date   HGBA1C 5.4 12/17/2020       Assessment & Plan:   1. DDD (degenerative disc disease), lumbar Long term chronic back pain that flared when she moved from one home to another in March 2022. Suspect over use and lifting heavy boxes. Will treat with prednisone taper and Robaxin. Apply moist heat and proceed with TENS unit with stretching exercises prn. Should consider follow up with orthopedist and/or physiatrist for more RFA injections. - predniSONE (DELTASONE) 10 MG tablet; Taper down by one tablet by mouth every 2 days starting at 6 for 2 days, 5 for 2 days, 4 for 2 days, 3  for 2 days, 2 for 2 days then 1 for 2 days. Divide tablets among meals and bedtime.  Dispense: 42 tablet; Refill: 0 - methocarbamol (ROBAXIN) 500 MG tablet; Take 1 tablet (500 mg total) by mouth 4 (four) times daily.  Dispense: 40 tablet; Refill: 2  2. Lumbar radiculitis Chronic back pain for the past 6-7 years. Has had RFA injections and treatments by physiatrist. Will treat with prednisone taper and Robaxin for muscle spasm and pain. - predniSONE (DELTASONE) 10 MG tablet; Taper down by one tablet by mouth every 2 days starting at 6 for 2 days, 5 for 2 days, 4 for 2 days, 3 for 2 days, 2 for 2 days then 1 for 2 days. Divide tablets among meals and bedtime.  Dispense: 42 tablet; Refill: 0 - methocarbamol (ROBAXIN) 500 MG tablet; Take 1 tablet (500 mg total) by mouth 4 (four) times daily.  Dispense: 40 tablet; Refill: 2  3. Primary osteoarthritis of both knees History of arthritis pain in the left knee flare recently. Has had some injections by orthopedist in the past. Mild discomfort today. Will treat with prednisone taper.  4. Essential hypertension Still some elevation with back pains. Continue follow up with cardiologist (Dr. Garen Lah).         Juluis Mire, CMA

## 2021-05-29 NOTE — Progress Notes (Deleted)
      Established patient visit   [name]   DOB: 08-Apr-1971   50 y.o. Female  MRN: [id number] Visit Date: 05/29/2021  Today's healthcare provider: Vernie Murders, PA-C   No chief complaint on file.  Subjective    HPI  Hypertension, follow-up  BP Readings from Last 3 Encounters:  05/02/21 (!) 130/94  04/15/21 (!) 150/90  04/11/21 (!) 149/98   Wt Readings from Last 3 Encounters:  05/02/21 212 lb (96.2 kg)  04/15/21 213 lb 6.4 oz (96.8 kg)  04/11/21 216 lb 3.2 oz (98.1 kg)     She was last seen for hypertension {NUMBERS 1-12:18279} {days/wks/mos/yrs:310907} ago.  BP at that visit was ***. Management since that visit includes ***.  She reports {excellent/good/fair/poor:19665} compliance with treatment. She {is/is not:9024} having side effects. {document side effects if present:1} She is following a {diet:21022986} diet. She {is/is not:9024} exercising. She {does/does not:200015} smoke.  Use of agents associated with hypertension: {bp agents assoc with hypertension:511::"none"}.   Outside blood pressures are {***enter patient reported home BP readings, or 'not being checked':1}. Symptoms: {Yes/No:20286} chest pain {Yes/No:20286} chest pressure  {Yes/No:20286} palpitations {Yes/No:20286} syncope  {Yes/No:20286} dyspnea {Yes/No:20286} orthopnea  {Yes/No:20286} paroxysmal nocturnal dyspnea {Yes/No:20286} lower extremity edema   Pertinent labs: Lab Results  Component Value Date   CHOL 220 (H) 12/17/2020   HDL 40 12/17/2020   LDLCALC 133 (H) 12/17/2020   TRIG 265 (H) 12/17/2020   CHOLHDL 4.2 03/20/2016   Lab Results  Component Value Date   NA 140 03/05/2021   K 4.5 03/05/2021   CREATININE 0.77 03/05/2021   GFRNONAA 107 12/17/2020   GFRAA 123 12/17/2020   GLUCOSE 106 (H) 03/05/2021     The 10-year ASCVD risk score Mikey Bussing DC Jr., et al., 2013) is: 8.2%    ---------------------------------------------------------------------------------------------------   {Show patient history (optional):23778}   Medications: Outpatient Medications Prior to Visit  Medication Sig   busPIRone (BUSPAR) 7.5 MG tablet Take 1 tablet (7.5 mg total) by mouth 2 (two) times daily.   hydrochlorothiazide (HYDRODIURIL) 25 MG tablet Take 1 tablet (25 mg total) by mouth daily.   hydrOXYzine (ATARAX/VISTARIL) 10 MG tablet Take 1 tablet (10 mg total) by mouth 3 (three) times daily as needed.   losartan (COZAAR) 50 MG tablet Take 1 tablet (50 mg total) by mouth daily.   No facility-administered medications prior to visit.    Review of Systems  {Labs  Heme  Chem  Endocrine  Serology  Results Review (optional):23779}   Objective    There were no vitals taken for this visit. {Show previous vital signs (optional):23777}   Physical Exam  ***  No results found for any visits on 05/29/21.  Assessment & Plan     ***  No follow-ups on file.      {provider attestation***:1}   Vernie Murders, Hershal Coria  [facility] [phone] (phone) [phone] (fax)  [place]

## 2021-06-03 ENCOUNTER — Emergency Department
Admission: EM | Admit: 2021-06-03 | Discharge: 2021-06-03 | Disposition: A | Discharge status: Home / Self Care | Payer: 59 | Attending: Emergency Medicine | Admitting: Emergency Medicine

## 2021-06-03 ENCOUNTER — Ambulatory Visit: Payer: Self-pay | Admitting: *Deleted

## 2021-06-03 ENCOUNTER — Other Ambulatory Visit: Payer: Self-pay

## 2021-06-03 DIAGNOSIS — G8929 Other chronic pain: Secondary | ICD-10-CM | POA: Diagnosis not present

## 2021-06-03 DIAGNOSIS — M549 Dorsalgia, unspecified: Secondary | ICD-10-CM | POA: Insufficient documentation

## 2021-06-03 DIAGNOSIS — Z5321 Procedure and treatment not carried out due to patient leaving prior to being seen by health care provider: Secondary | ICD-10-CM | POA: Diagnosis not present

## 2021-06-03 DIAGNOSIS — L819 Disorder of pigmentation, unspecified: Secondary | ICD-10-CM | POA: Insufficient documentation

## 2021-06-03 DIAGNOSIS — R202 Paresthesia of skin: Secondary | ICD-10-CM | POA: Diagnosis present

## 2021-06-03 LAB — BASIC METABOLIC PANEL
Anion gap: 9 (ref 5–15)
BUN: 13 mg/dL (ref 6–20)
CO2: 28 mmol/L (ref 22–32)
Calcium: 9.9 mg/dL (ref 8.9–10.3)
Chloride: 100 mmol/L (ref 98–111)
Creatinine, Ser: 0.63 mg/dL (ref 0.44–1.00)
GFR, Estimated: >60 mL/min (ref >60)
Glucose, Bld: 111 mg/dL — ABNORMAL HIGH (ref 70–99)
Potassium: 3.9 mmol/L (ref 3.5–5.1)
Sodium: 137 mmol/L (ref 135–145)

## 2021-06-03 LAB — CBC
HCT: 46.4 % — ABNORMAL HIGH (ref 36.0–46.0)
Hemoglobin: 16.2 g/dL — ABNORMAL HIGH (ref 12.0–15.0)
MCH: 32.3 pg (ref 26.0–34.0)
MCHC: 34.9 g/dL (ref 30.0–36.0)
MCV: 92.4 fL (ref 80.0–100.0)
Platelets: 340 10*3/uL (ref 150–400)
RBC: 5.02 MIL/uL (ref 3.87–5.11)
RDW: 12 % (ref 11.5–15.5)
WBC: 8.4 10*3/uL (ref 4.0–10.5)
nRBC: 0 % (ref 0.0–0.2)

## 2021-06-03 NOTE — ED Triage Notes (Signed)
Pt here with right sided numbness. Pt is on a steroid treatment for chronic back pain and states that she took a step and felt something in her back. Pt states that the numbness started in her right arm and down here left leg. Pt states her pinky finger on her right side is discolored and numb but she is still able to move her fingers.

## 2021-06-03 NOTE — Telephone Encounter (Signed)
C/o right forearm feeling like "pins and needles" and "pinky" turning "purple in color". Patient reports her right arm feels heavy and started 45 minutes ago . Has been treated for low back pain with steroids and muscle relaxer. Denies right leg weakness, no facial numbness or weakness, denies blurred vision, speech normal. Denies headaches, chest pain, difficulty breathing. Patient driving and reports she feels fine other than her right arm feeling heavy and like "pin and needles" and discoloration of pinky finger. Encouraged patient to pull over and call 911. Patient reports she will continue to drive herself to ED. Care advise given. Patient verbalized understanding of care advise and will drive herself to ED and call 911 if symptoms worsen.

## 2021-06-03 NOTE — Telephone Encounter (Signed)
Reason for Disposition  [1] Numbness (i.e., loss of sensation) of the face, arm / hand, or leg / foot on one side of the body AND [2] sudden onset AND [3] present now  Answer Assessment - Initial Assessment Questions 1. SYMPTOM: "What is the main symptom you are concerned about?" (e.g., weakness, numbness)     Right arm weakness, heavy, "pins and needles" feeling 45 minutes ago  2. ONSET: "When did this start?" (minutes, hours, days; while sleeping)     45 minutes ago  3. LAST NORMAL: "When was the last time you (the patient) were normal (no symptoms)?"     45 minutes ago  4. PATTERN "Does this come and go, or has it been constant since it started?"  "Is it present now?"     Constant now  5. CARDIAC SYMPTOMS: "Have you had any of the following symptoms: chest pain, difficulty breathing, palpitations?"     Denies  6. NEUROLOGIC SYMPTOMS: "Have you had any of the following symptoms: headache, dizziness, vision loss, double vision, changes in speech, unsteady on your feet?"     No  7. OTHER SYMPTOMS: "Do you have any other symptoms?"     Right arm heavy, pins and needles , right "pinky" finger discolored "purple" 8. PREGNANCY: "Is there any chance you are pregnant?" "When was your last menstrual period?"     na  Protocols used: Neurologic Deficit-A-AH

## 2021-06-04 ENCOUNTER — Ambulatory Visit (INDEPENDENT_AMBULATORY_CARE_PROVIDER_SITE_OTHER): Payer: 59 | Admitting: Licensed Clinical Social Worker

## 2021-06-04 DIAGNOSIS — F4310 Post-traumatic stress disorder, unspecified: Secondary | ICD-10-CM

## 2021-06-04 DIAGNOSIS — Z634 Disappearance and death of family member: Secondary | ICD-10-CM

## 2021-06-04 NOTE — Progress Notes (Signed)
   THERAPIST PROGRESS NOTE  Session Time: 2-3p  Participation Level: Active  Behavioral Response: Neat and Well GroomedAlertAngry and Depressed  Type of Therapy: Individual Therapy  Treatment Goals addressed: Anxiety and Diagnosis: PTSD  Interventions: CBT, Supportive, and Other: trauma focused; grief counseling  Summary: Michelle Simpson is a 50 y.o. female who presents with continuing symptoms related to PTSD diagnosis. Patient reports that mood has been fluctuating depending on situations, and that she is managing external stressors fairly well. Patient reports good quality and quantity of sleep. Allowed patient safe space to explore and express thoughts and feelings associated with recent external stressors and life events. Patient reports that she has some concerns about her step nephew--patient states that recently he got upset because the dog was chewing on his legos, grabbed a knife and stated that he wanted to kill himself. Patient reports that she told him that she was going to call 911 and he threw the knife down. Reviewed safety measures with patient regarding the nephew, and recommended local mental health resources to her if needed. Discussed assessing dangerousness, and how 911 is often the best choice if a weapon is involved. Patient reports that sometimes she feels torn because she doesn't know whether to call 911 or to just let the situation cool down. Patient is working with her nephew in regards to emotion regulation. Patient reports that nephew does take medication to help manage mood but is not currently in a counseling situation. Recommended outpatient counseling supports for nephew. Patient reports that she's been thinking about late boyfriend a lot, which is triggering some sadness and depression. Patient was visibly emotional throughout session. Reviewed behavior modification--binge eating.  Reviewed importance of self-care, physical activity, social engagement, and patient  having alone time to help reconnect with self. Allowed patient space to explore her relationship and recollect some happy memories with her and boyfriend.. Continued recommendations are as follows: self care behaviors, positive social engagements, focusing on overall work/home/life balance, and focusing on positive physical and emotional wellness.   Suicidal/Homicidal: No  Therapist Response: [name] is able to tell the story of her anxiety complete with attempts to resolve it and understand suggestions of others. [name] is able to identify the major life conflicts from the past and present that form basis for current anxiety that she is experiencing. [name] is able to explore traumas/grief from the past and articulate overall psychological impact. These behaviors are reflective of both personal growth and progress. Treatment to continue as indicated  Plan: Return again in 4 weeks.  Diagnosis: Axis I: Bereavement and Post Traumatic Stress Disorder    Axis II: No diagnosis    Rachel Bo Rhonda Vangieson, LCSW 06/04/2021

## 2021-07-03 ENCOUNTER — Other Ambulatory Visit: Payer: Self-pay | Admitting: Family Medicine

## 2021-07-03 DIAGNOSIS — F32A Depression, unspecified: Secondary | ICD-10-CM

## 2021-07-03 DIAGNOSIS — Z634 Disappearance and death of family member: Secondary | ICD-10-CM

## 2021-07-16 ENCOUNTER — Ambulatory Visit (INDEPENDENT_AMBULATORY_CARE_PROVIDER_SITE_OTHER): Payer: 59 | Admitting: Licensed Clinical Social Worker

## 2021-07-16 ENCOUNTER — Other Ambulatory Visit: Payer: Self-pay

## 2021-07-16 DIAGNOSIS — F4310 Post-traumatic stress disorder, unspecified: Secondary | ICD-10-CM

## 2021-07-16 DIAGNOSIS — Z634 Disappearance and death of family member: Secondary | ICD-10-CM | POA: Diagnosis not present

## 2021-07-16 NOTE — Progress Notes (Signed)
  THERAPIST PROGRESS NOTE  Session Time: 1-2p  Participation Level: Active  Behavioral Response: Neat and Well GroomedAlertAnxious and Depressed  Type of Therapy: Individual Therapy  Treatment Goals addressed: Anxiety and Diagnosis: depression, trauma  Interventions: CBT and Supportive  Summary: VIANEY AURICH is a 50 y.o. female who presents with a slight increase in overall depression and anxiety symptoms do you two upcoming anniversary of boyfriend's death. Patient reports that this time of year is a trigger, and that she has had increasing flashbacks of finding her boyfriend At the time of his death. Allow patients save space to explore her thoughts and feelings associated with this. Patient identified trauma triggers, and reviewed coping mechanisms that she is currently using to manage symptoms. Reviewed and demonstrated different coping mechanisms and relaxation strategies to help manage symptoms. Explored patient's relationship with nephew, Rolla Plate. patient reports that she is trying hard to be understanding whenever they have a verbal altercation, and tries hard to discuss and communicate her expectations to him after an argument. Reviewed motion regulation strategies with patient.  Continued recommendations are as follows: self care behaviors, positive social engagements, focusing on overall work/home/life balance, and focusing on positive physical and emotional wellness.   Suicidal/Homicidal: No  Therapist Response: Breda is able to tell the story of her anxiety complete with attempts to resolve it and understand suggestions of others. Amala is able to identify the major life conflicts from the past and present that form basis for current anxiety that she is experiencing. Ellenie is able to explore traumas/grief from the past and articulate overall psychological impact. These behaviors are reflective of both personal growth and progress. Treatment to continue as indicated  Plan: Return again  in 4 weeks.  Diagnosis: Axis I: Post Traumatic Stress Disorder    Axis II: No diagnosis    Rachel Bo Chrislynn Mosely, LCSW 07/16/2021

## 2021-07-29 ENCOUNTER — Encounter: Payer: Self-pay | Admitting: Psychiatry

## 2021-07-29 ENCOUNTER — Other Ambulatory Visit: Payer: Self-pay

## 2021-07-29 ENCOUNTER — Telehealth (INDEPENDENT_AMBULATORY_CARE_PROVIDER_SITE_OTHER): Payer: 59 | Admitting: Psychiatry

## 2021-07-29 DIAGNOSIS — F4310 Post-traumatic stress disorder, unspecified: Secondary | ICD-10-CM | POA: Diagnosis not present

## 2021-07-29 DIAGNOSIS — Z634 Disappearance and death of family member: Secondary | ICD-10-CM | POA: Diagnosis not present

## 2021-07-29 NOTE — Progress Notes (Signed)
Virtual Visit via Video Note  I connected with Michelle Simpson on 07/29/21 at  1:30 PM EDT by a video enabled telemedicine application and verified that I am speaking with the correct person using two identifiers.  Location Provider Location : [facility] Patient Location : Home  Participants: Patient , Provider    I discussed the limitations of evaluation and management by telemedicine and the availability of in person appointments. The patient expressed understanding and agreed to proceed.   I discussed the assessment and treatment plan with the patient. The patient was provided an opportunity to ask questions and all were answered. The patient agreed with the plan and demonstrated an understanding of the instructions.   The patient was advised to call back or seek an in-person evaluation if the symptoms worsen or if the condition fails to improve as anticipated.  [facility] MD OP Progress Note  07/29/2021 1:52 PM Michelle Simpson  MRN:  [id number]  Chief Complaint:  Chief Complaint   Follow-up; Anxiety; Depression    HPI: Michelle Simpson is a 50 year old Caucasian female, employed, divorced, lives in [place], has a history of PTSD, bereavement, IBS, osteoarthritis, pernicious anemia was evaluated by telemedicine today.  Patient today reports she has been able to manage her trauma related symptoms better than before.  She reports she has been able to identify her triggers and has been able to cope better than before.  She reports therapy sessions is beneficial.  She and her therapist are currently discussing possibly starting EMDR.  Patient denies any significant crying spells although she feels sad on and off.  She reports sleep is good.  Denies any significant anxiety attacks or panic attacks.  She reports BuSpar has been beneficial.  Denies side effects.  Patient denies any suicidality or homicidality.  Patient denies any other concerns today.  Visit Diagnosis:    ICD-10-CM   1. PTSD  (post-traumatic stress disorder)  F43.10     2. Bereavement  Z63.4       Past Psychiatric History: Reviewed past psychiatric history from progress note on 02/03/2021.  Past trials of Paxil-hallucination.  SSRIs makes her sick.  Past Medical History:  Past Medical History:  Diagnosis Date   B12 deficiency    Degenerative disc disease, lumbar    Depression    DJD (degenerative joint disease)    Eczema    Edema    Endometriosis    High mean corpuscular hemoglobin concentration (MCHC)    History of DVT (deep vein thrombosis)    Hypertension    IBS (irritable bowel syndrome)    Osteoarthritis of right hip    Panic attack    Pernicious anemia     Past Surgical History:  Procedure Laterality Date   ABDOMINAL HYSTERECTOMY     ANKLE SURGERY Right    ANKLE SURGERY Right    plate placed   Bilateral knee RFA nerve ablation @ [facility]     CHOLECYSTECTOMY     KNEE SURGERY Left 2012   LACRIMAL DUCT RECONSTRUCTION     LAPAROSCOPY     miscarriage D&C  1995   TYMPANOSTOMY [place]    Family Psychiatric History: Reviewed family psychiatric history from progress note on 02/03/2021  Family History:  Family History  Problem Relation Age of Onset   Hypertension Father    Hypertension Mother    Deep vein thrombosis Mother    Narcolepsy Mother    Anxiety disorder Mother    Hypertension Maternal Grandmother  Hypertension Paternal Grandmother    Hypertension Brother    Heart attack Brother    Liver disease Brother     Social History: Reviewed social history from progress note on 02/03/2021 Social History   Socioeconomic History   Marital status: Divorced    Spouse name: Not on file   Number of children: 1   Years of education: Not on file   Highest education level: Not on file  Occupational History   Not on file  Tobacco Use   Smoking status: Every Day    Packs/day: 1.00    Years: 20.00    Pack years: 20.00    Types: Cigarettes   Smokeless tobacco:  Never   Tobacco comments:    1 ppd - reported 04/29/2021  Vaping Use   Vaping Use: Former   Start date: 11/17/2015   Quit date: 01/07/2020  Substance and Sexual Activity   Alcohol use: Not Currently    Alcohol/week: 0.0 standard drinks   Drug use: No   Sexual activity: Never  Other Topics Concern   Not on file  Social History Narrative   Not on file   Social Determinants of Health   Financial Resource Strain: Not on file  Food Insecurity: Not on file  Transportation Needs: Not on file  Physical Activity: Not on file  Stress: Not on file  Social Connections: Not on file    Allergies:  Allergies  Allergen Reactions   Atenolol Hives    Urticaria.   Gabapentin Swelling   Nortriptyline Swelling   Amlodipine Swelling   Ibuprofen Other (See Comments)    With prolonged use swelling, HTN and congestion.   Latex Hives    Urticaria.   Meloxicam     Other reaction(s): Other (See Comments) Upset stomach.   Metoprolol Hives   Nsaids Swelling   Paxil Cr [Paroxetine Hcl Er] Other (See Comments)    hallucination    Metabolic Disorder Labs: Lab Results  Component Value Date   HGBA1C 5.4 12/17/2020   No results found for: PROLACTIN Lab Results  Component Value Date   CHOL 220 (H) 12/17/2020   TRIG 265 (H) 12/17/2020   HDL 40 12/17/2020   CHOLHDL 4.2 03/20/2016   LDLCALC 133 (H) 12/17/2020   LDLCALC 174 (H) 03/20/2016   Lab Results  Component Value Date   TSH 0.748 03/05/2021   TSH 0.677 12/17/2020    Therapeutic Level Labs: No results found for: LITHIUM No results found for: VALPROATE No components found for:  CBMZ  Current Medications: Current Outpatient Medications  Medication Sig Dispense Refill   busPIRone (BUSPAR) 7.5 MG tablet TAKE 1 TABLET(7.5 MG) BY MOUTH TWICE DAILY 180 tablet 0   hydrochlorothiazide (HYDRODIURIL) 25 MG tablet Take 1 tablet (25 mg total) by mouth daily. 90 tablet 0   hydrOXYzine (ATARAX/VISTARIL) 10 MG tablet Take 1 tablet (10 mg  total) by mouth 3 (three) times daily as needed. 30 tablet 0   losartan (COZAAR) 50 MG tablet Take 1 tablet (50 mg total) by mouth daily. 90 tablet 3   methocarbamol (ROBAXIN) 500 MG tablet Take 1 tablet (500 mg total) by mouth 4 (four) times daily. 40 tablet 2   predniSONE (DELTASONE) 10 MG tablet Taper down by one tablet by mouth every 2 days starting at 6 for 2 days, 5 for 2 days, 4 for 2 days, 3 for 2 days, 2 for 2 days then 1 for 2 days. Divide tablets among meals and bedtime. 42 tablet 0  No current facility-administered medications for this visit.     Musculoskeletal: Strength & Muscle Tone:  UTA Gait & Station: normal Patient leans: N/A  Psychiatric Specialty Exam: Review of Systems  Psychiatric/Behavioral:  The patient is nervous/anxious.   All other systems reviewed and are negative.  There were no vitals taken for this visit.There is no height or weight on file to calculate BMI.  General Appearance: Casual  Eye Contact:  Fair  Speech:  Clear and Coherent  Volume:  Normal  Mood:  Anxious Coping well  Affect:  Congruent  Thought Process:  Goal Directed and Descriptions of Associations: Intact  Orientation:  Full (Time, Place, and Person)  Thought Content: Logical   Suicidal Thoughts:  No  Homicidal Thoughts:  No  Memory:  Immediate;   Fair Recent;   Fair Remote;   Fair  Judgement:  Fair  Insight:  Fair  Psychomotor Activity:  Normal  Concentration:  Concentration: Fair and Attention Span: Fair  Recall:  [employer] of Knowledge: Fair  Language: Fair  Akathisia:  No  Handed:  Right  AIMS (if indicated): not done  Assets:  Communication Skills Desire for [place] Talents/Skills  ADL's:  Intact  Cognition: WNL  Sleep:  Fair   Screenings: GAD-7    [facility] Visit from 04/29/2021 in [facility]  Total GAD-7 Score 5      PHQ2-9    [place] Video Visit from 07/29/2021 in  [facility] Visit from 05/29/2021 in [facility] Visit from 04/29/2021 in [facility] Video Visit from 02/26/2021 in [facility] Video Visit from 02/03/2021 in [facility]  PHQ-2 Total Score '1 1 2 2 4  '$ PHQ-9 Total Score -- '4 7 10 12      '$ Flowsheet Row Video Visit from 07/29/2021 in [facility] Counselor from 06/04/2021 in [facility] from 06/03/2021 in [facility] No Risk No Risk No Risk        Assessment and Plan: Michelle Simpson is a 50 year old Caucasian female, employed, divorced, lives in [place], has a history of multiple medical problems as well as bereavement, sleep problems was evaluated by telemedicine today.  Patient is currently stable.  Plan as noted below.  Plan PTSD-stable BuSpar 7.5 mg p.o. twice daily Continue CBT with Ms. Christina Hussami   Bereavement-stable Hydroxyzine 10 mg p.o. 3 times daily as needed Continue CBT  Patient advised to follow up with cardiology for elevated blood pressure.  Patient advised to sign a release to obtain medical records.  Follow-up in clinic in 3 months in person.  This note was generated in part or whole with voice recognition software. Voice recognition is usually quite accurate but there are transcription errors that can and very often do occur. I apologize for any typographical errors that were not detected and corrected.      Ursula Alert, MD 07/29/2021, 1:52 PM

## 2021-08-04 ENCOUNTER — Other Ambulatory Visit: Payer: Self-pay

## 2021-08-04 ENCOUNTER — Ambulatory Visit: Payer: 59 | Admitting: Cardiology

## 2021-08-04 ENCOUNTER — Other Ambulatory Visit: Payer: Self-pay | Admitting: Cardiology

## 2021-08-04 MED ORDER — LOSARTAN POTASSIUM 50 MG PO TABS: 50.0000 mg | ORAL_TABLET | Freq: Every day | ORAL | 0 refills | Status: DC

## 2021-08-04 MED ORDER — LOSARTAN POTASSIUM 50 MG PO TABS
50.0000 mg | ORAL_TABLET | Freq: Every day | ORAL | 0 refills | Status: DC
Start: 2021-08-04 — End: 2021-08-29

## 2021-08-04 NOTE — Telephone Encounter (Signed)
*  STAT* If patient is at the pharmacy, call can be transferred to refill team.   1. Which medications need to be refilled? (please list name of each medication and dose if known) Losartan  2. Which pharmacy/location (including street and city if local pharmacy) is medication to be sent to? [facility]  3. Do they need a 30 day or 90 day supply? [place]

## 2021-08-14 ENCOUNTER — Ambulatory Visit: Payer: 59 | Admitting: Licensed Clinical Social Worker

## 2021-08-29 ENCOUNTER — Ambulatory Visit (INDEPENDENT_AMBULATORY_CARE_PROVIDER_SITE_OTHER): Payer: 59 | Admitting: Cardiology

## 2021-08-29 ENCOUNTER — Encounter: Payer: Self-pay | Admitting: Cardiology

## 2021-08-29 ENCOUNTER — Other Ambulatory Visit: Payer: Self-pay

## 2021-08-29 DIAGNOSIS — I10 Essential (primary) hypertension: Secondary | ICD-10-CM | POA: Diagnosis not present

## 2021-08-29 DIAGNOSIS — F17200 Nicotine dependence, unspecified, uncomplicated: Secondary | ICD-10-CM | POA: Diagnosis not present

## 2021-08-29 DIAGNOSIS — R5383 Other fatigue: Secondary | ICD-10-CM

## 2021-08-29 MED ORDER — LOSARTAN POTASSIUM 50 MG PO TABS
50.0000 mg | ORAL_TABLET | Freq: Every day | ORAL | 3 refills | Status: DC
Start: 2021-08-29 — End: 2022-09-18

## 2021-08-29 MED ORDER — HYDROCHLOROTHIAZIDE 25 MG PO TABS
25.0000 mg | ORAL_TABLET | Freq: Every day | ORAL | 3 refills | Status: DC
Start: 2021-08-29 — End: 2022-09-18

## 2021-08-29 NOTE — Patient Instructions (Signed)
Medication Instructions:  Your physician recommends that you continue on your current medications as directed. Please refer to the Current Medication list given to you today.  *If you need a refill on your cardiac medications before your next appointment, please call your pharmacy*   Lab Work: None ordered If you have labs (blood work) drawn today and your tests are completely normal, you will receive your results only by: [place] (if you have MyChart) OR A paper copy in the mail If you have any lab test that is abnormal or we need to change your treatment, we will call you to review the results.   Testing/Procedures: None ordered   Follow-Up: At [facility], you and your health needs are our priority.  As part of our continuing mission to provide you with exceptional heart care, we have created designated Provider Care Teams.  These Care Teams include your primary Cardiologist (physician) and Advanced Practice Providers (APPs -  Physician Assistants and Nurse Practitioners) who all work together to provide you with the care you need, when you need it.  We recommend signing up for the patient portal called "MyChart".  Sign up information is provided on this After Visit Summary.  MyChart is used to connect with patients for Virtual Visits (Telemedicine).  Patients are able to view lab/test results, encounter notes, upcoming appointments, etc.  Non-urgent messages can be sent to your provider as well.   To learn more about what you can do with MyChart, go to [email or web address].    Your next appointment:   Follow up as needed   The format for your next appointment:   In Person  Provider:   You may see Kate Sable, MD or one of the following Advanced Practice Providers on your designated Care Team:   Murray Hodgkins, NP Christell Faith, PA-C Marrianne Mood, PA-C Cadence Kathlen Mody, [place]   Other Instructions

## 2021-08-29 NOTE — Progress Notes (Signed)
Cardiology Office Note:    Date:  08/29/2021   ID:  Aura Camps, DOB Nov 28, 1970, MRN [id number]  PCP:  Tania Ade (Inactive)   [place]  Cardiologist:  Kate Sable, MD  Advanced Practice Provider:  No care team member to display Electrophysiologist:  None       Referring MD: Margo Common, PA-C   Chief Complaint  Patient presents with   OTher    3 month follow up -- Meds reviewed verbally with patient.      History of Present Illness:    Michelle Simpson is a 50 y.o. female with a hx of hypertension, current smoker x25+ years, who presents for follow-up.   Previously seen due to symptoms of fatigue.  Echo did not show any gross abnormalities, EF was normal, impaired relaxation.  Beta-blocker/bisoprolol was stopped with improvement in symptoms.  Due to snoring, she was advised to see pulmonary medicine for OSA evaluation.  She states feeling much better since stopping bisoprolol.  Blood pressures have been well controlled on current medical management.  Has no concerns at this time.  Prior notes Echo 04/2021 EF 60 to 65%, impaired relaxation.    Past Medical History:  Diagnosis Date   B12 deficiency    Degenerative disc disease, lumbar    Depression    DJD (degenerative joint disease)    Eczema    Edema    Endometriosis    High mean corpuscular hemoglobin concentration (MCHC)    History of DVT (deep vein thrombosis)    Hypertension    IBS (irritable bowel syndrome)    Osteoarthritis of right hip    Panic attack    Pernicious anemia     Past Surgical History:  Procedure Laterality Date   ABDOMINAL HYSTERECTOMY     ANKLE SURGERY Right    ANKLE SURGERY Right    plate placed   Bilateral knee RFA nerve ablation @ [facility]     KNEE SURGERY Left 2012   LACRIMAL DUCT RECONSTRUCTION     LAPAROSCOPY     miscarriage D&C  1995   TYMPANOSTOMY TUBE PLACEMENT  1977    Current  Medications: Current Meds  Medication Sig   busPIRone (BUSPAR) 7.5 MG tablet TAKE 1 TABLET(7.5 MG) BY MOUTH TWICE DAILY   hydrOXYzine (ATARAX/VISTARIL) 10 MG tablet Take 1 tablet (10 mg total) by mouth 3 (three) times daily as needed.   methocarbamol (ROBAXIN) 500 MG tablet Take 1 tablet (500 mg total) by mouth 4 (four) times daily.   [DISCONTINUED] hydrochlorothiazide (HYDRODIURIL) 25 MG tablet Take 1 tablet (25 mg total) by mouth daily.   [DISCONTINUED] losartan (COZAAR) 50 MG tablet Take 1 tablet (50 mg total) by mouth daily.     Allergies:   Atenolol, Gabapentin, Nortriptyline, Amlodipine, Ibuprofen, Latex, Meloxicam, Metoprolol, Nsaids, and Paxil cr [paroxetine hcl er]   Social History   Socioeconomic History   Marital status: Divorced    Spouse name: Not on file   Number of children: 1   Years of education: Not on file   Highest education level: Not on file  Occupational History   Not on file  Tobacco Use   Smoking status: Every Day    Packs/day: 1.00    Years: 20.00    Pack years: 20.00    Types: Cigarettes   Smokeless tobacco: Never   Tobacco comments:    1 ppd - reported 04/29/2021  Vaping Use  Vaping Use: Former   Start date: 11/17/2015   Quit date: 01/07/2020  Substance and Sexual Activity   Alcohol use: Not Currently    Alcohol/week: 0.0 standard drinks   Drug use: No   Sexual activity: Never  Other Topics Concern   Not on file  Social History Narrative   Not on file   Social Determinants of Health   Financial Resource Strain: Not on file  Food Insecurity: Not on file  Transportation Needs: Not on file  Physical Activity: Not on file  Stress: Not on file  Social Connections: Not on file     Family History: The patient's family history includes Anxiety disorder in her mother; Deep vein thrombosis in her mother; Heart attack in her brother; Hypertension in her brother, father, maternal grandmother, mother, and paternal grandmother; Liver disease in her  brother; Narcolepsy in her mother.  ROS:   Please see the history of present illness.     All other systems reviewed and are negative.  EKGs/Labs/Other Studies Reviewed:    The following studies were reviewed today:   EKG:  EKG is ordered today.  EKG shows normal sinus rhythm  Recent Labs: 03/05/2021: ALT 18; TSH 0.748 06/03/2021: BUN 13; Creatinine, Ser 0.63; Hemoglobin 16.2; Platelets 340; Potassium 3.9; Sodium 137  Recent Lipid Panel    Component Value Date/Time   CHOL 220 (H) 12/17/2020 0917   TRIG 265 (H) 12/17/2020 0917   HDL 40 12/17/2020 0917   CHOLHDL 4.2 03/20/2016 1010   LDLCALC 133 (H) 12/17/2020 0917     Risk Assessment/Calculations:      Physical Exam:    VS:  BP 130/84 (BP Location: Left Arm, Patient Position: Sitting, Cuff Size: Normal)   Pulse 77   Ht 5\' 7"  (1.702 m)   Wt 210 lb (95.3 kg)   SpO2 96%   BMI 32.89 kg/m     Wt Readings from Last 3 Encounters:  08/29/21 210 lb (95.3 kg)  06/03/21 209 lb (94.8 kg)  05/29/21 214 lb (97.1 kg)     GEN:  Well nourished, well developed in no acute distress HEENT: Normal NECK: No JVD; No carotid bruits LYMPHATICS: No lymphadenopathy CARDIAC: RRR, no murmurs, rubs, gallops RESPIRATORY:  Clear to auscultation without rales, wheezing or rhonchi  ABDOMEN: Soft, non-tender, non-distended MUSCULOSKELETAL:  No edema; No deformity  SKIN: Warm and dry NEUROLOGIC:  Alert and oriented x 3 PSYCHIATRIC:  Normal affect   ASSESSMENT:    1. Primary hypertension   2. Smoking   3. Other fatigue    PLAN:    In order of problems listed above:  Fatigue, symptoms have improved since stopping bisoprolol., EF 60 to 65%, impaired relaxation.  No additional intervention from a cardiac perspective indicated at this time. Hypertension, BP controlled.  Continue losartan, HCTZ. Current smoker, cessation recommended  Follow-up in 3 months     Medication Adjustments/Labs and Tests Ordered: Current medicines are  reviewed at length with the patient today.  Concerns regarding medicines are outlined above.  Orders Placed This Encounter  Procedures   EKG 12-Lead    Meds ordered this encounter  Medications   hydrochlorothiazide (HYDRODIURIL) 25 MG tablet    Sig: Take 1 tablet (25 mg total) by mouth daily.    Dispense:  90 tablet    Refill:  3   losartan (COZAAR) 50 MG tablet    Sig: Take 1 tablet (50 mg total) by mouth daily.    Dispense:  90 tablet  Refill:  3      Patient Instructions  Medication Instructions:  Your physician recommends that you continue on your current medications as directed. Please refer to the Current Medication list given to you today.  *If you need a refill on your cardiac medications before your next appointment, please call your pharmacy*   Lab Work: None ordered If you have labs (blood work) drawn today and your tests are completely normal, you will receive your results only by: [place] (if you have MyChart) OR A paper copy in the mail If you have any lab test that is abnormal or we need to change your treatment, we will call you to review the results.   Testing/Procedures: None ordered   Follow-Up: At [facility], you and your health needs are our priority.  As part of our continuing mission to provide you with exceptional heart care, we have created designated Provider Care Teams.  These Care Teams include your primary Cardiologist (physician) and Advanced Practice Providers (APPs -  Physician Assistants and Nurse Practitioners) who all work together to provide you with the care you need, when you need it.  We recommend signing up for the patient portal called "MyChart".  Sign up information is provided on this After Visit Summary.  MyChart is used to connect with patients for Virtual Visits (Telemedicine).  Patients are able to view lab/test results, encounter notes, upcoming appointments, etc.  Non-urgent messages can be sent to your provider  as well.   To learn more about what you can do with MyChart, go to [email or web address].    Your next appointment:   Follow up as needed   The format for your next appointment:   In Person  Provider:   You may see Kate Sable, MD or one of the following Advanced Practice Providers on your designated Care Team:   Murray Hodgkins, NP Christell Faith, PA-C Marrianne Mood, PA-C Cadence [place], [place]   Other Instructions    Signed, Kate Sable, MD  08/29/2021 5:00 PM    [place]

## 2021-10-06 ENCOUNTER — Other Ambulatory Visit: Payer: Self-pay

## 2021-10-06 ENCOUNTER — Telehealth: Payer: Self-pay | Admitting: Family Medicine

## 2021-10-06 DIAGNOSIS — Z634 Disappearance and death of family member: Secondary | ICD-10-CM

## 2021-10-06 DIAGNOSIS — F32A Depression, unspecified: Secondary | ICD-10-CM

## 2021-10-06 MED ORDER — BUSPIRONE HCL 7.5 MG PO TABS: ORAL_TABLET | ORAL | 0 refills | Status: DC

## 2021-10-06 NOTE — Telephone Encounter (Signed)
[facility] faxed refill request for the following medications:  busPIRone (BUSPAR) 7.5 MG tablet   Please advise.

## 2021-10-21 ENCOUNTER — Encounter: Payer: Self-pay | Admitting: Internal Medicine

## 2021-10-28 ENCOUNTER — Ambulatory Visit: Payer: 59 | Admitting: Psychiatry

## 2021-11-13 ENCOUNTER — Encounter: Payer: Self-pay | Admitting: Internal Medicine

## 2021-11-27 ENCOUNTER — Ambulatory Visit: Payer: Self-pay | Admitting: *Deleted

## 2021-11-27 ENCOUNTER — Encounter: Payer: Self-pay | Admitting: Nurse Practitioner

## 2021-11-27 ENCOUNTER — Encounter: Payer: Self-pay | Admitting: Internal Medicine

## 2021-11-27 ENCOUNTER — Telehealth: Payer: Self-pay | Admitting: Nurse Practitioner

## 2021-11-27 DIAGNOSIS — U071 COVID-19: Secondary | ICD-10-CM

## 2021-11-27 MED ORDER — BENZONATATE 100 MG PO CAPS: 200.0000 mg | ORAL_CAPSULE | Freq: Three times a day (TID) | ORAL | 0 refills | Status: AC | PRN

## 2021-11-27 MED ORDER — MOLNUPIRAVIR EUA 200MG CAPSULE: 4.0000 | ORAL_CAPSULE | Freq: Two times a day (BID) | ORAL | 0 refills | Status: AC

## 2021-11-27 MED ORDER — ALBUTEROL SULFATE HFA 108 (90 BASE) MCG/ACT IN AERS: 2.0000 | INHALATION_SPRAY | Freq: Four times a day (QID) | RESPIRATORY_TRACT | 0 refills | Status: DC | PRN

## 2021-11-27 NOTE — Telephone Encounter (Signed)
Pt is calling to report that she just tested postive for COVID. Sx include body aches, low grade temp 99, and flu like sx.       Attempted to reach pt, VM full, unable to leave message to CB.

## 2021-11-27 NOTE — Telephone Encounter (Signed)
Chief Complaint: Covid positive today. Cough,body aches Symptoms: Dry cough, body aches, temp 100.0,mild sore throat Frequency: ONset Tuesday Pertinent Negatives: Patient denies SOB, CP Disposition: _0 ED /_1 Urgent Care (no appt availability in office) / _2 Appointment(In office/virtual)/ _3  [facility]/ _4 Home Care/ _5 Refused Recommended Disposition /_6 [facility]/ _7  Follow-up with PCP Additional Notes:        Reason for Disposition  [1] COVID-19 diagnosed by positive lab test (e.g., PCR, rapid self-test kit) AND [2] mild symptoms (e.g., cough, fever, others) AND [1] no complications or SOB  Answer Assessment - Initial Assessment Questions 1. COVID-19 DIAGNOSIS: "Who made your COVID-19 diagnosis?" "Was it confirmed by a positive lab test or self-test?" If not diagnosed by a doctor (or NP/PA), ask "Are there lots of cases (community spread) where you live?" Note: See public health department website, if unsure.     Today home test 2. COVID-19 EXPOSURE: "Was there any known exposure to COVID before the symptoms began?" CDC Definition of close contact: within 6 feet (2 meters) for a total of 15 minutes or more over a 24-hour period.      Yes 3. ONSET: "When did the COVID-19 symptoms start?"      Tuesday 4. WORST SYMPTOM: "What is your worst symptom?" (e.g., cough, fever, shortness of breath, muscle aches)     Cough and body aches 5. COUGH: "Do you have a cough?" If Yes, ask: "How bad is the cough?"       Cough, dry 6. FEVER: "Do you have a fever?" If Yes, ask: "What is your temperature, how was it measured, and when did it start?"     100.0 7. RESPIRATORY STATUS: "Describe your breathing?" (e.g., shortness of breath, wheezing, unable to speak)      Mild wheezing at HS 8. BETTER-SAME-WORSE: "Are you getting better, staying the same or getting worse compared to yesterday?"  If getting worse, ask, "In what way?"     Little worse 9. HIGH RISK DISEASE: "Do you have  any chronic medical problems?" (e.g., asthma, heart or lung disease, weak immune system, obesity, etc.)     *No Answer* 10. VACCINE: "Have you had the COVID-19 vaccine?" If Yes, ask: "Which one, how many shots, when did you get it?"       Yes 2  11. BOOSTER: "Have you received your COVID-19 booster?" If Yes, ask: "Which one and when did you get it?"       Yes 2   13. OTHER SYMPTOMS: "Do you have any other symptoms?"  (e.g., chills, fatigue, headache, loss of smell or taste, muscle pain, sore throat)       Body aches, sore throat, headache 14. O2 SATURATION MONITOR:  "Do you use an oxygen saturation monitor (pulse oximeter) at home?" If Yes, ask "What is your reading (oxygen level) today?" "What is your usual oxygen saturation reading?" (e.g., 95%)  Protocols used: Coronavirus (COVID-19) Diagnosed or Suspected-A-AH

## 2021-11-27 NOTE — Progress Notes (Signed)
Virtual Visit Consent   Michelle Simpson, you are scheduled for a virtual visit with a [facility] provider today.     Just as with appointments in the office, your consent must be obtained to participate.  Your consent will be active for this visit and any virtual visit you may have with one of our providers in the next 365 days.     If you have a MyChart account, a copy of this consent can be sent to you electronically.  All virtual visits are billed to your insurance company just like a traditional visit in the office.    As this is a virtual visit, video technology does not allow for your provider to perform a traditional examination.  This may limit your provider's ability to fully assess your condition.  If your provider identifies any concerns that need to be evaluated in person or the need to arrange testing (such as labs, EKG, etc.), we will make arrangements to do so.     Although advances in technology are sophisticated, we cannot ensure that it will always work on either your end or our end.  If the connection with a video visit is poor, the visit may have to be switched to a telephone visit.  With either a video or telephone visit, we are not always able to ensure that we have a secure connection.     I need to obtain your verbal consent now.   Are you willing to proceed with your visit today?    Michelle Simpson has provided verbal consent on 11/27/2021 for a virtual visit (video or telephone).   Apolonio Schneiders, FNP   Date: 11/27/2021 5:43 PM   Virtual Visit via Video Note   I, Apolonio Schneiders, connected with  Michelle Simpson  ([id number], 02/13/1971) on 11/27/21 at  5:45 PM EST by a video-enabled telemedicine application and verified that I am speaking with the correct person using two identifiers.  Location: Patient: Virtual Visit Location Patient: Home Provider: Virtual Visit Location Provider: Home Office   I discussed the limitations of evaluation and management by telemedicine and  the availability of in person appointments. The patient expressed understanding and agreed to proceed.    History of Present Illness: Michelle Simpson is a 51 y.o. who identifies as a female who was assigned female at birth, and is being seen today after testing positive for COVID today. She has had symptoms for the past 3-4 days. She took a test 2 days ago and was negative, then repeated today and was positive.   She has had symptoms of headache, body aches, chills, fever and a cough   The cough is worse when laying down - she has a dry cough.  She has been taking tylenol OTC for symptoms relief.   She has only mild nasal congestion.   She denies a history of asthma She does smoke cigarettes   She has needed inhalers in the past.   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   Chronic pelvic pain in female 03/20/2016   Allergic rhinitis 03/20/2016   Hyperlipidemia 03/20/2016   Osteoarthritis of hip 03/20/2016   Heterozygous factor V Leiden mutation ([place]) 03/20/2016   History of deep venous thrombosis 03/20/2016   Primary osteoarthritis of both knees 10/23/2014   DDD (degenerative disc disease), lumbar 10/04/2014   Chondromalacia of knee, 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   Neuritis or radiculitis due to rupture of lumbar intervertebral disc 09/12/2014   Lumbar radiculitis 09/12/2014   Edema of lower extremity 09/05/2014   Bilateral leg edema 09/05/2014   Hip  impingement syndrome 06/19/2014   Endometriosis 05/06/2010   Primary hypertension 03/11/2009    Allergies:  Allergies  Allergen Reactions   Atenolol Hives    Urticaria.   Gabapentin Swelling   Nortriptyline Swelling   Amlodipine Swelling   Ibuprofen Other (See Comments)    With prolonged use swelling, HTN and congestion.   Latex Hives    Urticaria.   Meloxicam     Other reaction(s): Other (See Comments) Upset stomach.   Metoprolol Hives   Nsaids Swelling   Paxil Cr [Paroxetine Hcl Er] Other (See Comments)    hallucination   Medications:  Current Outpatient Medications:    busPIRone (BUSPAR) 7.5 MG tablet, TAKE 1 TABLET(7.5 MG) BY MOUTH TWICE DAILY, Disp: 60 tablet, Rfl: 0   hydrochlorothiazide (HYDRODIURIL) 25 MG tablet, Take 1 tablet (25 mg total) by mouth daily., Disp: 90 tablet, Rfl: 3   hydrOXYzine (ATARAX/VISTARIL) 10 MG tablet, Take 1 tablet (10 mg total) by mouth 3 (three) times daily as needed., Disp: 30 tablet, Rfl: 0   losartan (COZAAR) 50 MG tablet, Take 1 tablet (50 mg total) by mouth daily., Disp: 90 tablet, Rfl: 3   methocarbamol (ROBAXIN) 500 MG tablet, Take 1 tablet (500 mg total) by mouth 4 (four) times daily., Disp: 40 tablet, Rfl: 2  Observations/Objective: Patient is well-developed, well-nourished in no acute distress.  Resting comfortably at home.  Head is normocephalic, atraumatic.  No labored breathing.  Speech is clear and coherent with logical content.  Patient is alert and oriented at baseline.    Assessment and Plan: 1. COVID-19  - molnupiravir EUA (LAGEVRIO) 200 mg CAPS capsule; Take 4 capsules (800 mg total) by mouth 2 (two) times daily for 5 days.  Dispense: 40 capsule; Refill: 0 - benzonatate (TESSALON) 100 MG capsule; Take 2 capsules (200 mg total) by mouth 3 (three) times daily as needed for up to 10 days for cough (1-2 capsules up to three times daily as needed).  Dispense: 30 capsule; Refill: 0 - albuterol (VENTOLIN HFA) 108 (90 Base)  MCG/ACT inhaler; Inhale 2 puffs into the lungs every 6 (six) hours as needed for wheezing or shortness of breath.  Dispense: 8 g; Refill: 0    May continue to use tylenol as needed   Follow Up Instructions: I discussed the assessment and treatment plan with the patient. The patient was provided an opportunity to ask questions and all were answered. The patient agreed with the plan and demonstrated an understanding of the instructions.  A copy of instructions were sent to the patient via MyChart unless otherwise noted below.     The patient was advised to call back or seek an in-person evaluation if the symptoms worsen or if the condition fails to improve as anticipated.  Time:  I spent 15 minutes with the patient via telehealth technology discussing the above problems/concerns.  Apolonio Schneiders, FNP

## 2021-12-11 ENCOUNTER — Telehealth: Payer: 59 | Admitting: Nurse Practitioner

## 2021-12-11 DIAGNOSIS — U071 COVID-19: Secondary | ICD-10-CM | POA: Diagnosis not present

## 2021-12-11 DIAGNOSIS — J40 Bronchitis, not specified as acute or chronic: Secondary | ICD-10-CM

## 2021-12-11 MED ORDER — AZITHROMYCIN 250 MG PO TABS: ORAL_TABLET | ORAL | 0 refills | Status: AC

## 2021-12-11 MED ORDER — PREDNISONE 20 MG PO TABS: 20.0000 mg | ORAL_TABLET | Freq: Two times a day (BID) | ORAL | 0 refills | Status: AC

## 2021-12-11 NOTE — Progress Notes (Signed)
Virtual Visit Consent   Michelle Simpson, you are scheduled for a virtual visit with a [facility] provider today.     Just as with appointments in the office, your consent must be obtained to participate.  Your consent will be active for this visit and any virtual visit you may have with one of our providers in the next 365 days.     If you have a MyChart account, a copy of this consent can be sent to you electronically.  All virtual visits are billed to your insurance company just like a traditional visit in the office.    As this is a virtual visit, video technology does not allow for your provider to perform a traditional examination.  This may limit your provider's ability to fully assess your condition.  If your provider identifies any concerns that need to be evaluated in person or the need to arrange testing (such as labs, EKG, etc.), we will make arrangements to do so.     Although advances in technology are sophisticated, we cannot ensure that it will always work on either your end or our end.  If the connection with a video visit is poor, the visit may have to be switched to a telephone visit.  With either a video or telephone visit, we are not always able to ensure that we have a secure connection.     I need to obtain your verbal consent now.   Are you willing to proceed with your visit today?    Michelle Simpson has provided verbal consent on 12/11/2021 for a virtual visit (video or telephone).   Apolonio Schneiders, FNP   Date: 12/11/2021 4:01 PM   Virtual Visit via Video Note   I, Apolonio Schneiders, connected with  Michelle Simpson  ([id number], [birth year]) on 12/11/21 at  4:00 PM EST by a video-enabled telemedicine application and verified that I am speaking with the correct person using two identifiers.  Location: Patient: Virtual Visit Location Patient: Home Provider: Virtual Visit Location Provider: Office/Clinic   I discussed the limitations of evaluation and management by telemedicine and  the availability of in person appointments. The patient expressed understanding and agreed to proceed.    History of Present Illness: Michelle Simpson is a 51 y.o. who identifies as a female who was assigned female at birth, and is being seen today with complaints of recurrent  COVID symptoms. Her original symptoms started on 1/9 started and she started on anti-viral on day 4 of symptoms, tessalon pearls and albuterol inhaler as well .  She improved for about a week and then woke up yesterday with a productive cough. When she coughs now it is painful, her fatigue is back and she had a fever yesterday.  She was up coughing all night.   She feels it is difficult to take a deep breath, similar to how she has felt with bronchitis in the past.   This is her first episode of COVID  She has been vaccinated x4   She denies a history of asthma She is a smoker and has had bronchitis in the past.   She has been using the Albuterol twice daily.   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   Chronic pelvic pain in female 03/20/2016   Allergic rhinitis 03/20/2016   Hyperlipidemia 03/20/2016   Osteoarthritis of hip 03/20/2016   Heterozygous factor V Leiden mutation ([place]) 03/20/2016   History of deep venous thrombosis 03/20/2016   Primary osteoarthritis of both knees 10/23/2014   DDD (degenerative disc disease), lumbar 10/04/2014   Chondromalacia of knee, 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   Neuritis or radiculitis due to rupture of lumbar intervertebral disc 09/12/2014    Lumbar radiculitis 09/12/2014   Edema of lower extremity 09/05/2014   Bilateral leg edema 09/05/2014   Hip impingement syndrome 06/19/2014   Endometriosis 05/06/2010   Primary hypertension 03/11/2009    Allergies:  Allergies  Allergen Reactions   Atenolol Hives    Urticaria.   Gabapentin Swelling   Nortriptyline Swelling   Amlodipine Swelling   Ibuprofen Other (See Comments)    With prolonged use swelling, HTN and congestion.   Latex Hives    Urticaria.   Meloxicam     Other reaction(s): Other (See Comments) Upset stomach.   Metoprolol Hives   Nsaids Swelling   Paxil Cr [Paroxetine Hcl Er] Other (See Comments)    hallucination   Medications:  Current Outpatient Medications:    albuterol (VENTOLIN HFA) 108 (90 Base) MCG/ACT inhaler, Inhale 2 puffs into the lungs every 6 (six) hours as needed for wheezing or shortness of breath., Disp: 8 g, Rfl: 0   busPIRone (BUSPAR) 7.5 MG tablet, TAKE 1 TABLET(7.5 MG) BY MOUTH TWICE DAILY, Disp: 60 tablet, Rfl: 0   hydrochlorothiazide (HYDRODIURIL) 25 MG tablet, Take 1 tablet (25 mg total) by mouth daily., Disp: 90 tablet, Rfl: 3   hydrOXYzine (ATARAX/VISTARIL) 10 MG tablet, Take 1 tablet (10 mg total) by mouth 3 (three) times daily as needed., Disp: 30 tablet, Rfl: 0   losartan (COZAAR) 50 MG tablet, Take 1 tablet (50 mg total) by mouth daily., Disp: 90 tablet, Rfl: 3   methocarbamol (ROBAXIN) 500 MG tablet, Take 1 tablet (500 mg total) by mouth 4 (four) times daily., Disp: 40 tablet, Rfl: 2  Observations/Objective: Patient is well-developed, well-nourished in no acute distress.  Resting comfortably at home.  Head is normocephalic, atraumatic.  No labored breathing.  Speech is clear and coherent with logical content.  Patient is alert and oriented at baseline.    Assessment and Plan: 1. COVID-19 2. Bronchitis  - predniSONE (DELTASONE) 20 MG tablet; Take 1 tablet (20 mg total) by mouth 2 (two) times daily with a meal for 5 days.   Dispense: 10 tablet; Refill: 0 - azithromycin (ZITHROMAX) 250 MG tablet; Take 2 tablets on day 1, then 1 tablet daily on days 2 through 5  Dispense: 6 tablet; Refill: 0     Follow Up Instructions: I discussed the assessment and treatment plan with the patient. The patient was provided an opportunity to ask questions and all were answered. The patient agreed with the plan and demonstrated an understanding of the instructions.  A copy of instructions were sent to the patient via MyChart unless otherwise noted below.    The patient was advised to call back or seek an in-person evaluation if the symptoms worsen or if the condition fails to improve as anticipated.  Time:  I spent 15 minutes with the patient via telehealth technology discussing the above problems/concerns.    Judson Roch  Jerline Pain, FNP

## 2021-12-15 ENCOUNTER — Ambulatory Visit
Admission: EM | Admit: 2021-12-15 | Discharge: 2021-12-15 | Disposition: A | Discharge status: Home / Self Care | Payer: 59 | Attending: Emergency Medicine | Admitting: Emergency Medicine

## 2021-12-15 ENCOUNTER — Ambulatory Visit (INDEPENDENT_AMBULATORY_CARE_PROVIDER_SITE_OTHER): Payer: 59

## 2021-12-15 ENCOUNTER — Other Ambulatory Visit: Payer: Self-pay

## 2021-12-15 ENCOUNTER — Encounter: Payer: Self-pay | Admitting: Emergency Medicine

## 2021-12-15 DIAGNOSIS — J069 Acute upper respiratory infection, unspecified: Secondary | ICD-10-CM

## 2021-12-15 DIAGNOSIS — R059 Cough, unspecified: Secondary | ICD-10-CM | POA: Diagnosis not present

## 2021-12-15 MED ORDER — PREDNISONE 10 MG (21) PO TBPK: ORAL_TABLET | Freq: Every day | ORAL | 0 refills | Status: DC

## 2021-12-15 MED ORDER — PROMETHAZINE-DM 6.25-15 MG/5ML PO SYRP: 5.0000 mL | ORAL_SOLUTION | Freq: Four times a day (QID) | ORAL | 0 refills | Status: DC | PRN

## 2021-12-15 NOTE — Discharge Instructions (Addendum)
Your chest x-ray is negative  Finish course of Z-Pak  You may either finish your prednisone that you are currently taking or begin use of new prednisone taper beginning tomorrow morning taking with food as directed  Continue use of albuterol inhaler for chest tightness or wheezing  Maintaining adequate hydration may help to thin secretions and soothe the respiratory mucosa   Warm Liquids- Ingestion of warm liquids may have a soothing effect on the respiratory mucosa, increase the flow of nasal mucus, and loosen respiratory secretions, making them easier to remove  May try honey (2.5 to 5 mL [0.5 to 1 teaspoon]) can be given straight or diluted in liquid (juice). Corn syrup may be substituted if honey is not available.    May follow-up with urgent care with primary doctor for continued symptoms continue to persist

## 2021-12-15 NOTE — ED Provider Notes (Signed)
[facility]    CSN: [id number] Arrival date & time: 12/15/21  1314      History   Chief Complaint Chief Complaint  Patient presents with   Cough    HPI Michelle Simpson is a 51 y.o. female.   Patient presents with nonproductive cough, chest congestion, shortness of breath and wheezing for 5 days.  Endorses that she had COVID-19 on November 24, 2021, took antivirals, symptoms resolved after 1 week.  Symptoms returned after 1 week of good health.  Did a virtual visit with her PCP on December 11, 2021, prescribed a Z-Pak and prednisone burst, currently has 1 to 2 days of medication left.  Daily smoker.   Past Medical History:  Diagnosis Date   B12 deficiency    Degenerative disc disease, lumbar    Depression    DJD (degenerative joint disease)    Eczema    Edema    Endometriosis    High mean corpuscular hemoglobin concentration (MCHC)    History of DVT (deep vein thrombosis)    Hypertension    IBS (irritable bowel syndrome)    Osteoarthritis of right hip    Panic attack    Pernicious anemia     Patient Active Problem List   Diagnosis Date Noted   Snoring 04/15/2021   Smoker 04/15/2021   History of fibromyalgia 03/05/2021   B12 deficiency 03/05/2021   Vitamin D insufficiency 03/05/2021   Fatigue 03/05/2021   Muscle spasm of back 02/11/2021   PTSD (post-traumatic stress disorder) 02/03/2021   Bereavement 02/03/2021   Spondylosis of lumbar region without myelopathy or radiculopathy 07/15/2016   Irritable bowel syndrome 04/29/2016   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   Chronic pelvic pain in female 03/20/2016   Allergic rhinitis 03/20/2016   Hyperlipidemia 03/20/2016   Osteoarthritis of hip 03/20/2016   Heterozygous factor V Leiden mutation ([place]) 03/20/2016   History of deep venous thrombosis 03/20/2016   Primary osteoarthritis of both knees 10/23/2014   DDD (degenerative disc disease), lumbar 10/04/2014   Chondromalacia of knee, 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    Neuritis or radiculitis due to rupture of lumbar intervertebral disc 09/12/2014   Lumbar radiculitis 09/12/2014   Edema of lower extremity 09/05/2014   Bilateral leg edema 09/05/2014   Hip impingement syndrome 06/19/2014   Endometriosis 05/06/2010   Primary hypertension 03/11/2009    Past Surgical History:  Procedure Laterality Date   ABDOMINAL HYSTERECTOMY     ANKLE SURGERY Right    ANKLE SURGERY Right    plate placed   Bilateral knee RFA nerve ablation @ [facility]     CHOLECYSTECTOMY     KNEE SURGERY Left 2012   LACRIMAL DUCT RECONSTRUCTION     LAPAROSCOPY     miscarriage D&C  1995   TYMPANOSTOMY TUBE PLACEMENT  1977    OB History   No obstetric history on file.      Home Medications    Prior to Admission medications   Medication Sig Start Date End Date Taking? Authorizing Provider  albuterol (VENTOLIN HFA) 108 (90 Base) MCG/ACT inhaler Inhale 2 puffs into the lungs every 6 (six) hours as needed for wheezing or shortness of breath. 11/27/21  Yes Apolonio Schneiders, FNP  azithromycin (ZITHROMAX) 250 MG tablet Take 2 tablets on day 1, then 1 tablet daily on days 2 through 5 12/11/21 12/16/21 Yes Apolonio Schneiders, FNP  busPIRone (BUSPAR) 7.5 MG tablet TAKE 1 TABLET(7.5 MG) BY MOUTH TWICE DAILY 10/06/21  Yes Drubel, Ria Comment, PA-C  hydrochlorothiazide (HYDRODIURIL)  25 MG tablet Take 1 tablet (25 mg total) by mouth daily. 08/29/21  Yes Kate Sable, MD  hydrOXYzine (ATARAX/VISTARIL) 10 MG tablet Take 1 tablet (10 mg total) by mouth 3 (three) times daily as needed. 12/31/20  Yes Carles Collet M, PA-C  losartan (COZAAR) 50 MG tablet Take 1 tablet (50 mg total) by mouth daily. 08/29/21 12/15/21 Yes Agbor-Etang, Aaron Edelman, MD  methocarbamol (ROBAXIN) 500 MG tablet Take 1 tablet (500 mg total) by mouth 4 (four) times daily. 05/29/21  Yes Chrismon, Vickki Muff, PA-C  predniSONE (DELTASONE) 20 MG tablet Take 1 tablet (20 mg total) by mouth 2 (two) times daily with a meal for 5 days. 12/11/21  12/16/21 Yes Apolonio Schneiders, FNP    Family History Family History  Problem Relation Age of Onset   Hypertension Father    Hypertension Mother    Deep vein thrombosis Mother    Narcolepsy Mother    Anxiety disorder Mother    Hypertension Maternal Grandmother    Hypertension Paternal Grandmother    Hypertension Brother    Heart attack Brother    Liver disease Brother     Social History Social History   Tobacco Use   Smoking status: Every Day    Packs/day: 1.00    Years: 20.00    Pack years: 20.00    Types: Cigarettes   Smokeless tobacco: Never   Tobacco comments:    1 ppd - reported 04/29/2021  Vaping Use   Vaping Use: Former   Start date: 11/17/2015   Quit date: 01/07/2020  Substance Use Topics   Alcohol use: Not Currently    Alcohol/week: 0.0 standard drinks   Drug use: No     Allergies   Atenolol, Gabapentin, Nortriptyline, Amlodipine, Ibuprofen, Latex, Meloxicam, Metoprolol, Nsaids, and Paxil cr [paroxetine hcl er]   Review of Systems Review of Systems  Constitutional: Negative.   HENT: Negative.    Respiratory:  Positive for cough, shortness of breath and wheezing. Negative for apnea, choking, chest tightness and stridor.   Cardiovascular: Negative.   Gastrointestinal: Negative.   Skin: Negative.   Neurological: Negative.     Physical Exam Triage Vital Signs ED Triage Vitals  Enc Vitals Group     BP 12/15/21 1337 (!) 176/111     Pulse Rate 12/15/21 1337 78     Resp 12/15/21 1337 18     Temp 12/15/21 1337 98.6 F (37 C)     Temp Source 12/15/21 1337 Oral     SpO2 12/15/21 1337 97 %     Weight 12/15/21 1335 210 lb 1.6 oz (95.3 kg)     Height 12/15/21 1335 5\' 7"  (1.702 m)     Head Circumference --      Peak Flow --      Pain Score 12/15/21 1334 7     Pain Loc --      Pain Edu? --      Excl. in [place]? --    No data found.  Updated Vital Signs BP (!) 176/111 (BP Location: Right Arm)    Pulse 78    Temp 98.6 F (37 C) (Oral)    Resp 18    Ht 5\' 7"   (1.702 m)    Wt 210 lb 1.6 oz (95.3 kg)    SpO2 97%    BMI 32.91 kg/m   Visual Acuity Right Eye Distance:   Left Eye Distance:   Bilateral Distance:    Right Eye Near:   Left Eye Near:  Bilateral Near:     Physical Exam Constitutional:      Appearance: Normal appearance.  HENT:     Head: Normocephalic.     Right Ear: Tympanic membrane, ear canal and external ear normal.     Left Ear: Tympanic membrane, ear canal and external ear normal.     Nose: Congestion present. No rhinorrhea.     Mouth/Throat:     Mouth: Mucous membranes are moist.     Pharynx: Oropharynx is clear.  Eyes:     Extraocular Movements: Extraocular movements intact.  Cardiovascular:     Rate and Rhythm: Normal rate and regular rhythm.     Pulses: Normal pulses.     Heart sounds: Normal heart sounds.  Pulmonary:     Effort: Pulmonary effort is normal.     Breath sounds: Normal breath sounds.     Comments: Dry cough witnessed Skin:    General: Skin is warm and dry.  Neurological:     Mental Status: She is alert and oriented to person, place, and time. Mental status is at baseline.  Psychiatric:        Mood and Affect: Mood normal.        Behavior: Behavior normal.     UC Treatments / Results  Labs (all labs ordered are listed, but only abnormal results are displayed) Labs Reviewed - No data to display  EKG   Radiology No results found.  Procedures Procedures (including critical care time)  Medications Ordered in UC Medications - No data to display  Initial Impression / Assessment and Plan / UC Course  I have reviewed the triage vital signs and the nursing notes.  Pertinent labs & imaging results that were available during my care of the patient were reviewed by me and considered in my medical decision making (see chart for details).  Viral URI with cough  Vital signs are stable, patient is in no signs of distress, chest x-ray negative, discussed findings with patient, will prescribe  Promethazine DM as she endorses that been the Tessalon pills have not been effective, will extend prednisone course using a 60 mg taper, advised patient to continue using albuterol as needed, may attempt warm liquids and increase liquids to thin secretions, teaspoons of honey, steam bath and humidifier in addition, patient to follow-up with PCP for persistent symptoms  Final diagnoses:  None   Discharge Instructions   None    ED Prescriptions   None    PDMP not reviewed this encounter.   Hans Eden, NP 12/15/21 1430

## 2021-12-15 NOTE — ED Triage Notes (Signed)
Pt c/o cough, chest congestion. Started about a week ago. She states she had covid on 11/24/21. She was feeling better then felt bad again. She had a virtual visit and given z pak and prednisone last week. She states she was told to be seen again if she was not feeling better by the weekend.

## 2022-04-19 ENCOUNTER — Other Ambulatory Visit: Payer: Self-pay | Admitting: Physician Assistant

## 2022-04-19 DIAGNOSIS — Z634 Disappearance and death of family member: Secondary | ICD-10-CM

## 2022-04-19 DIAGNOSIS — F32A Depression, unspecified: Secondary | ICD-10-CM

## 2022-06-12 IMAGING — CR DG CHEST 2V
2 series · 2 of 2 positions shown · non-contrast
Comparison: 03/05/2021

CLINICAL DATA: Cough

EXAM:
CHEST - 2 VIEW

[chest pa]
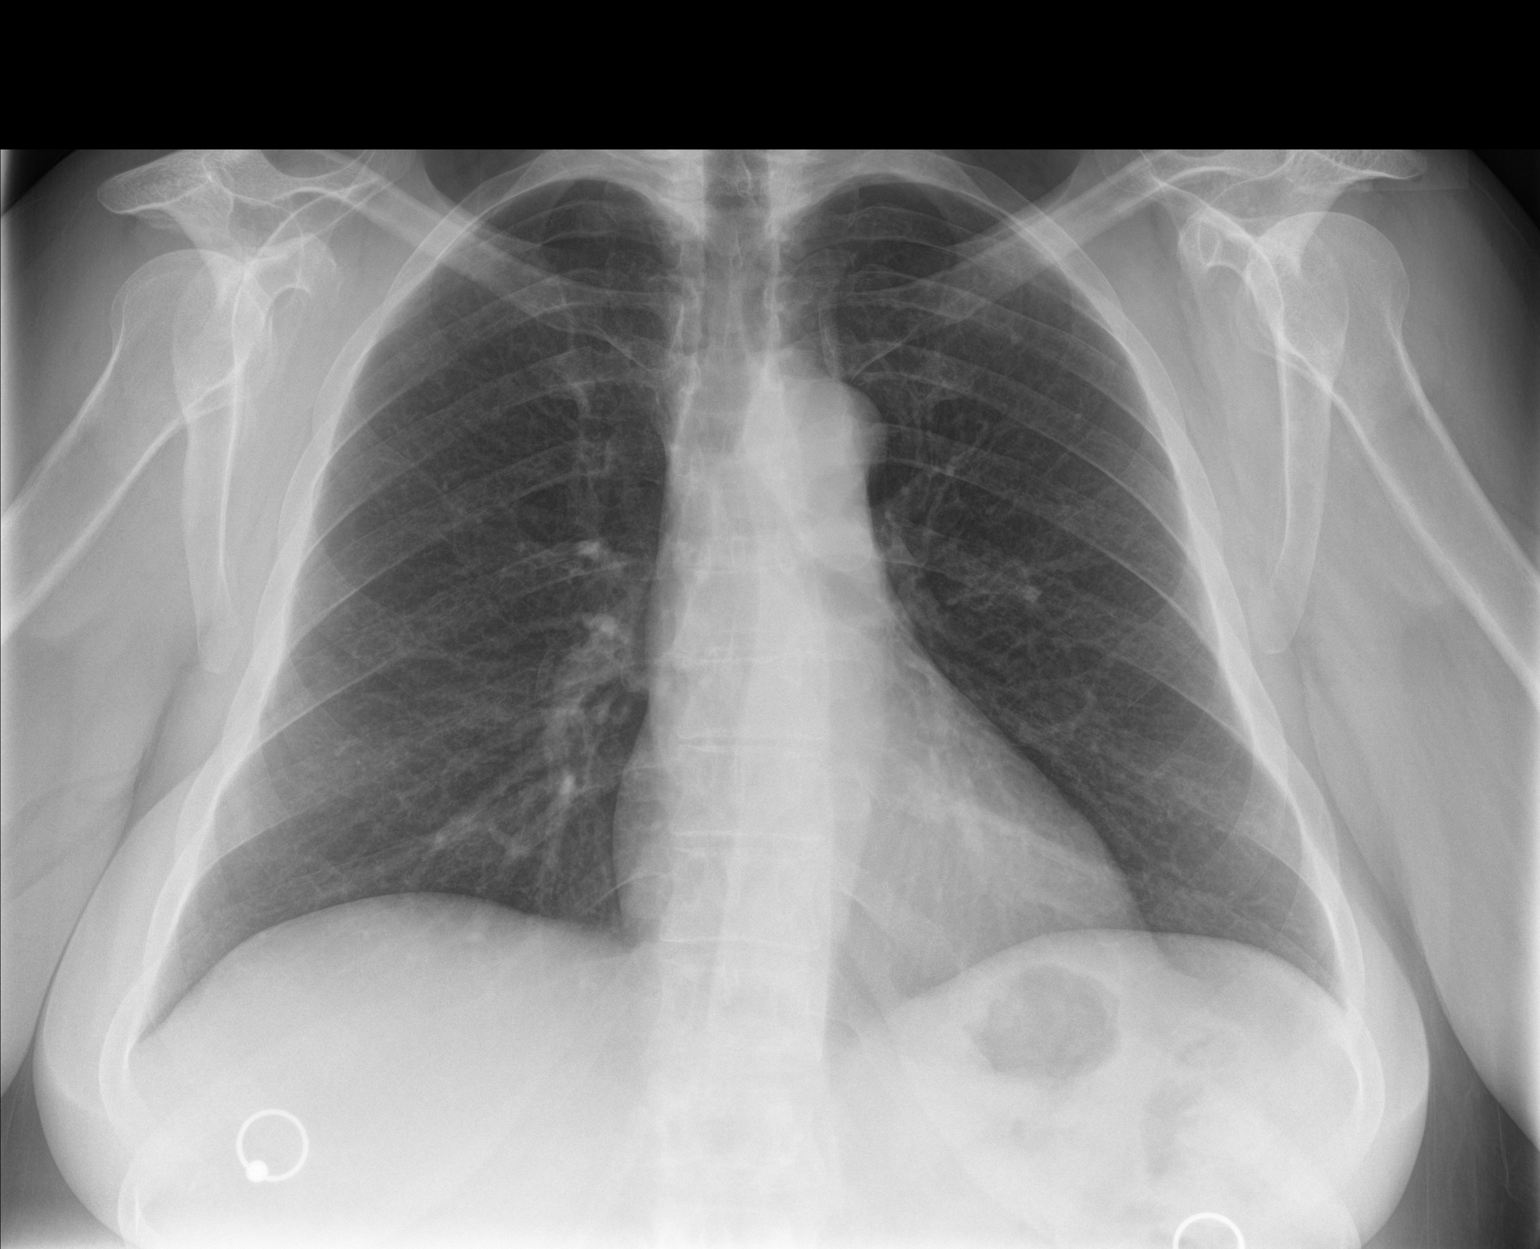

[chest lat]
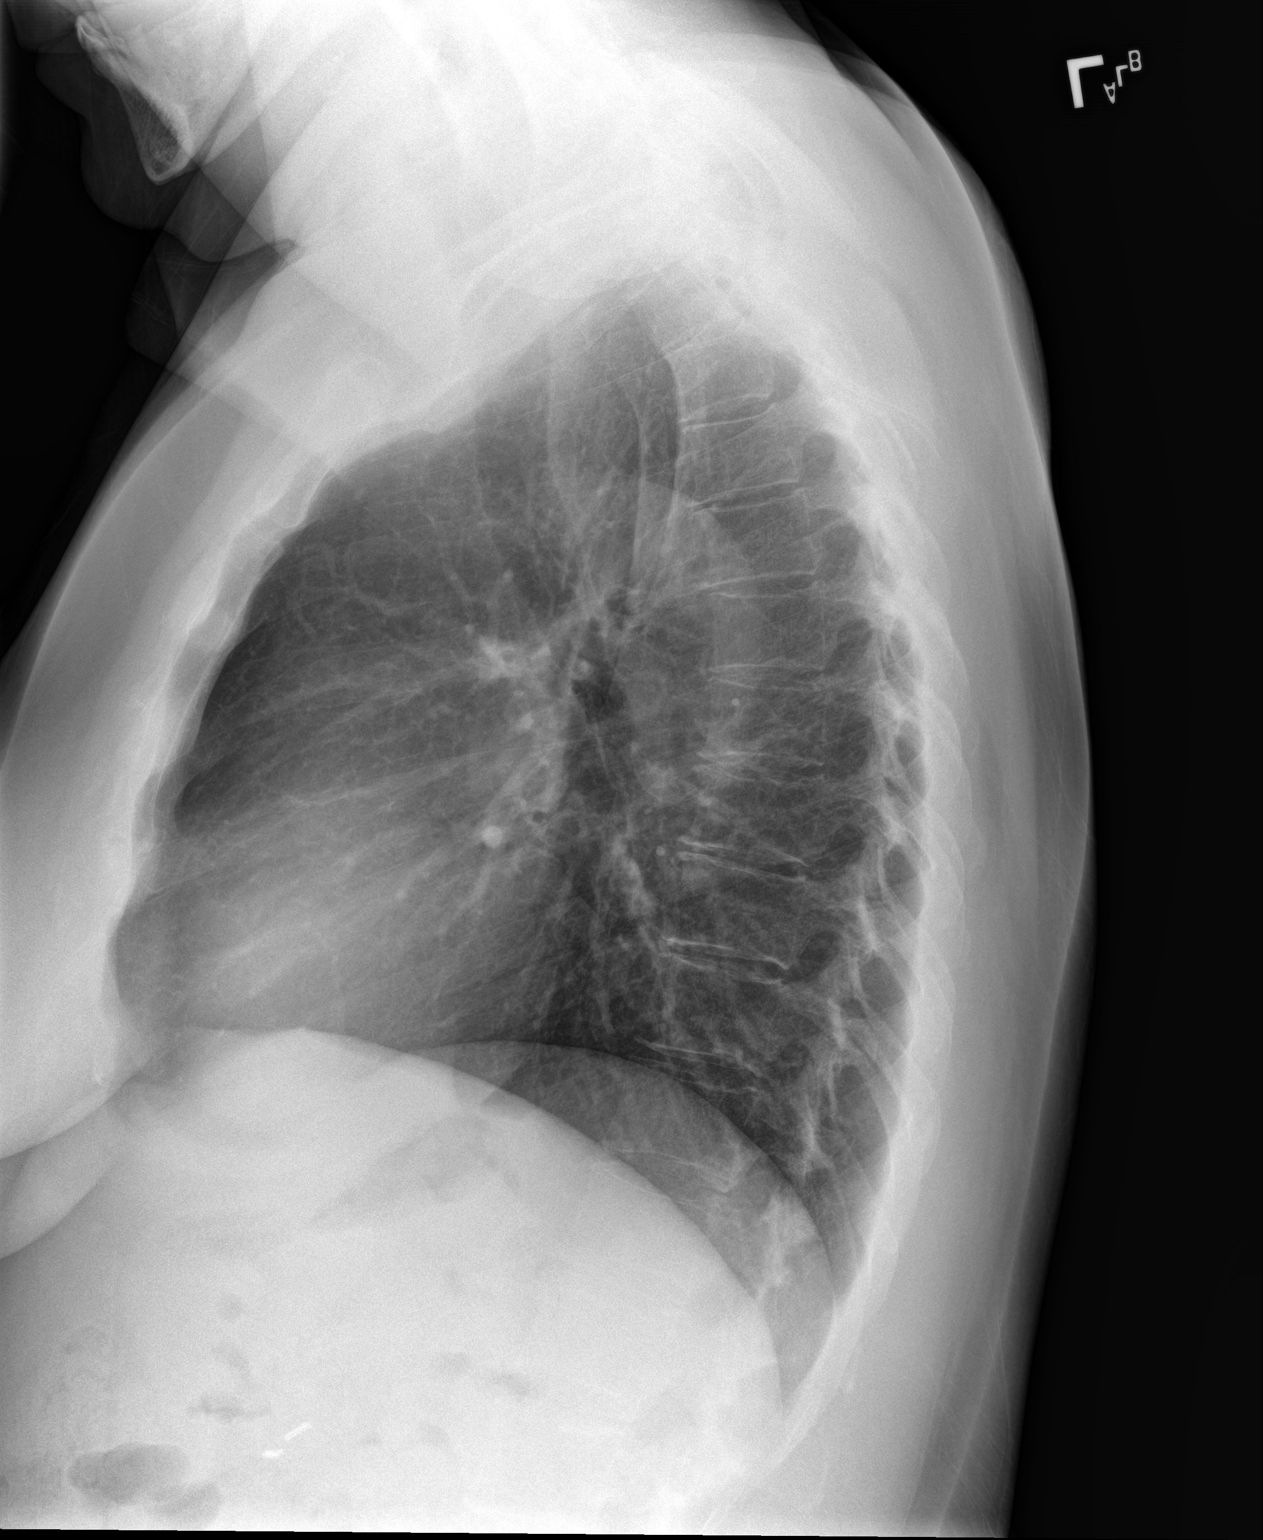

[2 of 2 positions shown; findings below may reference images not displayed]

FINDINGS: The heart size and mediastinal contours are within normal limits.
Both lungs are clear. The visualized skeletal structures are
unremarkable.
IMPRESSION: No acute abnormality of the lungs.

## 2022-09-18 ENCOUNTER — Telehealth: Payer: Self-pay | Admitting: Cardiology

## 2022-09-18 MED ORDER — HYDROCHLOROTHIAZIDE 25 MG PO TABS: 25.0000 mg | ORAL_TABLET | Freq: Every day | ORAL | 0 refills | Status: DC

## 2022-09-18 MED ORDER — LOSARTAN POTASSIUM 50 MG PO TABS: 50.0000 mg | ORAL_TABLET | Freq: Every day | ORAL | 0 refills | Status: DC

## 2022-09-18 NOTE — Telephone Encounter (Signed)
*  STAT* If patient is at the pharmacy, call can be transferred to refill team.   1. Which medications need to be refilled? (please list name of each medication and dose if known)  hydrochlorothiazide (HYDRODIURIL) 25 MG tablet  losartan (COZAAR) 50 MG tablet  2. Which pharmacy/location (including street and city if local pharmacy) is medication to be sent to? [id number] [place] [address]   3. Do they need a 30 day or 90 day supply? 30 day  Patient has an appointment 11/06/2022. She is completely out of medication.

## 2022-10-14 ENCOUNTER — Other Ambulatory Visit: Payer: Self-pay | Admitting: *Deleted

## 2022-10-14 MED ORDER — LOSARTAN POTASSIUM 50 MG PO TABS: 50.0000 mg | ORAL_TABLET | Freq: Every day | ORAL | 0 refills | Status: DC

## 2022-10-14 MED ORDER — HYDROCHLOROTHIAZIDE 25 MG PO TABS: 25.0000 mg | ORAL_TABLET | Freq: Every day | ORAL | 0 refills | Status: DC

## 2022-10-16 ENCOUNTER — Encounter: Payer: Self-pay | Admitting: Cardiology

## 2022-10-16 NOTE — Telephone Encounter (Signed)
error 

## 2022-10-30 ENCOUNTER — Encounter: Payer: Self-pay | Admitting: Internal Medicine

## 2022-11-06 ENCOUNTER — Ambulatory Visit: Payer: Self-pay | Admitting: Cardiology

## 2022-11-17 ENCOUNTER — Other Ambulatory Visit: Payer: Self-pay

## 2022-11-17 MED ORDER — LOSARTAN POTASSIUM 50 MG PO TABS: 50.0000 mg | ORAL_TABLET | Freq: Every day | ORAL | 0 refills | Status: DC

## 2022-11-17 MED ORDER — HYDROCHLOROTHIAZIDE 25 MG PO TABS: 25.0000 mg | ORAL_TABLET | Freq: Every day | ORAL | 0 refills | Status: DC

## 2022-11-20 ENCOUNTER — Ambulatory Visit: Payer: Self-pay | Attending: Cardiology | Admitting: Cardiology

## 2022-11-20 ENCOUNTER — Encounter: Payer: Self-pay | Admitting: Cardiology

## 2022-11-20 NOTE — Progress Notes (Deleted)
Cardiology Clinic Note   Patient Name: Michelle Simpson Date of Encounter: 11/20/2022  Primary Care Provider:  Margo Common, PA-C (Inactive) Primary Cardiologist:  Kate Sable, MD  Patient Profile    52 year old female with past medical history of hypertension, current smoker x 25+ years, hyperlipidemia, history of DVT, who is here today for follow-up.  Past Medical History    Past Medical History:  Diagnosis Date   B12 deficiency    Degenerative disc disease, lumbar    Depression    DJD (degenerative joint disease)    Eczema    Edema    Endometriosis    High mean corpuscular hemoglobin concentration (MCHC)    History of DVT (deep vein thrombosis)    Hypertension    IBS (irritable bowel syndrome)    Osteoarthritis of right hip    Panic attack    Pernicious anemia    Past Surgical History:  Procedure Laterality Date   ABDOMINAL HYSTERECTOMY     ANKLE SURGERY Right    ANKLE SURGERY Right    plate placed   Bilateral knee RFA nerve ablation @ [facility]     CHOLECYSTECTOMY     KNEE SURGERY Left 2012   LACRIMAL DUCT RECONSTRUCTION     LAPAROSCOPY     miscarriage D&C  1995   TYMPANOSTOMY TUBE PLACEMENT  1977    Allergies  Allergies  Allergen Reactions   Atenolol Hives    Urticaria.   Gabapentin Swelling   Nortriptyline Swelling   Amlodipine Swelling   Ibuprofen Other (See Comments)    With prolonged use swelling, HTN and congestion.   Latex Hives    Urticaria.   Meloxicam     Other reaction(s): Other (See Comments) Upset stomach.   Metoprolol Hives   Nsaids Swelling   Paxil Cr [Paroxetine Hcl Er] Other (See Comments)    hallucination    History of Present Illness    Michelle Simpson. Michelle Simpson is a 52 year old female with a previous medical history of hypertension, current smoker x 25+ years, hyperlipidemia, and history of DVT.  She was last seen in clinic on 08/29/21 by Dr. Garen Lah who previously been seen for symptoms of fatigue.   Echocardiogram did not show gross abnormalities, EF was normal with impaired relaxation.  Beta-blocker/bisoprolol was stopped with improvement in symptoms of fatigue.  She was advised to see pulmonary medicine for OSA evaluation.  Medication changes made no further testing was ordered at that time.  She returns to clinic today  Home Medications   Current Outpatient Medications  Medication Sig Dispense Refill   albuterol (VENTOLIN HFA) 108 (90 Base) MCG/ACT inhaler Inhale 2 puffs into the lungs every 6 (six) hours as needed for wheezing or shortness of breath. 8 g 0   busPIRone (BUSPAR) 7.5 MG tablet TAKE 1 TABLET(7.5 MG) BY MOUTH TWICE DAILY 60 tablet 0   hydrochlorothiazide (HYDRODIURIL) 25 MG tablet Take 1 tablet (25 mg total) by mouth daily. 30 tablet 0   hydrOXYzine (ATARAX/VISTARIL) 10 MG tablet Take 1 tablet (10 mg total) by mouth 3 (three) times daily as needed. 30 tablet 0   losartan (COZAAR) 50 MG tablet Take 1 tablet (50 mg total) by mouth daily. 30 tablet 0   methocarbamol (ROBAXIN) 500 MG tablet Take 1 tablet (500 mg total) by mouth 4 (four) times daily. 40 tablet 2   predniSONE (STERAPRED UNI-PAK 21 TAB) 10 MG (21) TBPK tablet Take by mouth daily. Take 6 tabs by mouth daily  for 2 days, then 5 tabs for 2 days, then 4 tabs for 2 days, then 3 tabs for 2 days, 2 tabs for 2 days, then 1 tab by mouth daily for 2 days 42 tablet 0   promethazine-dextromethorphan (PROMETHAZINE-DM) 6.25-15 MG/5ML syrup Take 5 mLs by mouth 4 (four) times daily as needed for cough. 118 mL 0   No current facility-administered medications for this visit.    Family History    Family History  Problem Relation Age of Onset   Hypertension Father    Hypertension Mother    Deep vein thrombosis Mother    Narcolepsy Mother    Anxiety disorder Mother    Hypertension Maternal Grandmother    Hypertension Paternal Grandmother    Hypertension Brother    Heart attack Brother    Liver disease Brother    She  indicated that her mother is deceased. She indicated that her father is deceased. She indicated that both of her brothers are alive. She indicated that the status of her maternal grandmother is unknown. She indicated that the status of her paternal grandmother is unknown.  Social History    Social History   Socioeconomic History   Marital status: Divorced    Spouse name: Not on file   Number of children: 1   Years of education: Not on file   Highest education level: Not on file  Occupational History   Not on file  Tobacco Use   Smoking status: Every Day    Packs/day: 1.00    Years: 20.00    Total pack years: 20.00    Types: Cigarettes   Smokeless tobacco: Never   Tobacco comments:    1 ppd - reported 04/29/2021  Vaping Use   Vaping Use: Former   Start date: 11/17/2015   Quit date: 01/07/2020  Substance and Sexual Activity   Alcohol use: Not Currently    Alcohol/week: 0.0 standard drinks of alcohol   Drug use: No   Sexual activity: Never  Other Topics Concern   Not on file  Social History Narrative   Not on file   Social Determinants of Health   Financial Resource Strain: Not on file  Food Insecurity: Not on file  Transportation Needs: Not on file  Physical Activity: Not on file  Stress: Not on file  Social Connections: Not on file  Intimate Partner Violence: Not on file     Review of Systems    General:  No chills, fever, night sweats or weight changes.  Cardiovascular:  No chest pain, dyspnea on exertion, edema, orthopnea, palpitations, paroxysmal nocturnal dyspnea. Dermatological: No rash, lesions/masses Respiratory: No cough, dyspnea Urologic: No hematuria, dysuria Abdominal:   No nausea, vomiting, diarrhea, bright red blood per rectum, melena, or hematemesis Neurologic:  No visual changes, wkns, changes in mental status. All other systems reviewed and are otherwise negative except as noted above.     Physical Exam    VS:  There were no vitals taken for  this visit. , BMI There is no height or weight on file to calculate BMI.     GEN: Well nourished, well developed, in no acute distress. HEENT: normal. Neck: Supple, no JVD, carotid bruits, or masses. Cardiac: RRR, no murmurs, rubs, or gallops. No clubbing, cyanosis, edema.  Radials 2+/PT 2+ and equal bilaterally.  Respiratory:  Respirations regular and unlabored, clear to auscultation bilaterally. GI: Soft, nontender, nondistended, BS + x 4. MS: no deformity or atrophy. Skin: warm and dry, no rash. Neuro:  Strength and sensation are intact. Psych: Normal affect.  Accessory Clinical Findings    ECG personally reviewed by me today- *** - No acute changes  Lab Results  Component Value Date   WBC 8.4 06/03/2021   HGB 16.2 (H) 06/03/2021   HCT 46.4 (H) 06/03/2021   MCV 92.4 06/03/2021   PLT 340 06/03/2021   Lab Results  Component Value Date   CREATININE 0.63 06/03/2021   BUN 13 06/03/2021   NA 137 06/03/2021   K 3.9 06/03/2021   CL 100 06/03/2021   CO2 28 06/03/2021   Lab Results  Component Value Date   ALT 18 03/05/2021   AST 12 03/05/2021   ALKPHOS 98 03/05/2021   BILITOT 0.4 03/05/2021   Lab Results  Component Value Date   CHOL 220 (H) 12/17/2020   HDL 40 12/17/2020   LDLCALC 133 (H) 12/17/2020   TRIG 265 (H) 12/17/2020   CHOLHDL 4.2 03/20/2016    Lab Results  Component Value Date   HGBA1C 5.4 12/17/2020    Assessment & Plan   1.  ***  Kimberlyann Hollar, NP 11/20/2022, 12:59 PM

## 2023-01-19 ENCOUNTER — Encounter: Payer: Self-pay | Admitting: Internal Medicine

## 2023-01-19 ENCOUNTER — Ambulatory Visit (INDEPENDENT_AMBULATORY_CARE_PROVIDER_SITE_OTHER): Payer: BLUE CROSS/BLUE SHIELD | Admitting: Physician Assistant

## 2023-01-19 ENCOUNTER — Encounter: Payer: Self-pay | Admitting: Physician Assistant

## 2023-01-19 VITALS — BP 199/115 | HR 70 | Temp 99.1°F | Wt 230.0 lb

## 2023-01-19 DIAGNOSIS — M170 Bilateral primary osteoarthritis of knee: Secondary | ICD-10-CM

## 2023-01-19 DIAGNOSIS — F17200 Nicotine dependence, unspecified, uncomplicated: Secondary | ICD-10-CM | POA: Diagnosis not present

## 2023-01-19 DIAGNOSIS — E669 Obesity, unspecified: Secondary | ICD-10-CM | POA: Diagnosis not present

## 2023-01-19 DIAGNOSIS — I10 Essential (primary) hypertension: Secondary | ICD-10-CM | POA: Diagnosis not present

## 2023-01-19 DIAGNOSIS — M5416 Radiculopathy, lumbar region: Secondary | ICD-10-CM

## 2023-01-19 DIAGNOSIS — M5136 Other intervertebral disc degeneration, lumbar region: Secondary | ICD-10-CM

## 2023-01-19 MED ORDER — HYDRALAZINE HCL 25 MG PO TABS: 25.0000 mg | ORAL_TABLET | Freq: Three times a day (TID) | ORAL | 0 refills | Status: DC

## 2023-01-19 MED ORDER — LOSARTAN POTASSIUM 50 MG PO TABS: ORAL_TABLET | ORAL | 0 refills | Status: DC

## 2023-01-19 MED ORDER — HYDROCHLOROTHIAZIDE 25 MG PO TABS: 25.0000 mg | ORAL_TABLET | Freq: Two times a day (BID) | ORAL | 0 refills | Status: DC

## 2023-01-19 NOTE — Progress Notes (Unsigned)
Argentina Ponder DeSanto,acting as a Education administrator for [employer], PA-C.,have documented all relevant documentation on the behalf of Mardene Speak, PA-C,as directed by  [employer], PA-C while in the presence of [employer], PA-C.     Established patient visit   [name]   DOB: 02/20/1971   52 y.o. Female  MRN: [id number] Visit Date: 01/19/2023  Today's healthcare provider: Mardene Speak, PA-C  CC: HTN FU, chronic knee pain  Subjective    HPI  Hypertension, follow-up  BP Readings from Last 3 Encounters:  01/19/23 (!) 199/115  12/15/21 (!) 176/111  08/29/21 130/84   Wt Readings from Last 3 Encounters:  01/19/23 230 lb (104.3 kg)  12/15/21 210 lb 1.6 oz (95.3 kg)  08/29/21 210 lb (95.3 kg)     She was last seen for hypertension 2 years ago.  BP at that visit was as above. Management since that visit includes none.  However, patient has been without her HCTZ for a while.  She reports poor compliance with treatment. She is not having side effects.  She is following a  low carb  diet. She is not exercising. She does smoke.  Use of agents associated with hypertension: none.   Outside blood pressures are Runs all over. Symptoms: No chest pain No chest pressure  No palpitations No syncope  No dyspnea No orthopnea  No paroxysmal nocturnal dyspnea No lower extremity edema   Pertinent labs Lab Results  Component Value Date   CHOL 220 (H) 12/17/2020   HDL 40 12/17/2020   LDLCALC 133 (H) 12/17/2020   TRIG 265 (H) 12/17/2020   CHOLHDL 4.2 03/20/2016   Lab Results  Component Value Date   NA 137 06/03/2021   K 3.9 06/03/2021   CREATININE 0.63 06/03/2021   GFRNONAA >60 06/03/2021   GLUCOSE 111 (H) 06/03/2021   TSH 0.748 03/05/2021     The 10-year ASCVD risk score (Arnett DK, et al., 2019) is: 18.8%  --------------------------------------------------------------------------------------------------- Arthritis Knees-Patient also complains of arthritic pain in  both knees with the right worse in the left.  She has had this problem for about 10 years with the last 6 months getting progressively worse.  She states she wakes hourly at night to change positions due to pain.  She has been currently treating the pain with Tylenol every 3 hours with minimal relief.  Past treatment includes cortisone injection, synovial and RFA .  She states none of which helped for more than about 2 months.  Hips-Patient also has arthritic pain in bilateral hips with right worse than left.  She has had this problem for about 8 years.  She is currently using Tylenol for this pain.  Past treatment includes epidural and other injections.    Medications: Outpatient Medications Prior to Visit  Medication Sig   hydrOXYzine (ATARAX/VISTARIL) 10 MG tablet Take 1 tablet (10 mg total) by mouth 3 (three) times daily as needed.   methocarbamol (ROBAXIN) 500 MG tablet Take 1 tablet (500 mg total) by mouth 4 (four) times daily.   [DISCONTINUED] hydrochlorothiazide (HYDRODIURIL) 25 MG tablet Take 1 tablet (25 mg total) by mouth daily.   [DISCONTINUED] losartan (COZAAR) 50 MG tablet Take 1 tablet (50 mg total) by mouth daily.   albuterol (VENTOLIN HFA) 108 (90 Base) MCG/ACT inhaler Inhale 2 puffs into the lungs every 6 (six) hours as needed for wheezing or shortness of breath. (Patient not taking: Reported on 01/19/2023)   busPIRone (BUSPAR) 7.5 MG tablet TAKE 1 TABLET(7.5 MG)  BY MOUTH TWICE DAILY (Patient not taking: Reported on 01/19/2023)   predniSONE (STERAPRED UNI-PAK 21 TAB) 10 MG (21) TBPK tablet Take by mouth daily. Take 6 tabs by mouth daily  for 2 days, then 5 tabs for 2 days, then 4 tabs for 2 days, then 3 tabs for 2 days, 2 tabs for 2 days, then 1 tab by mouth daily for 2 days (Patient not taking: Reported on 01/19/2023)   promethazine-dextromethorphan (PROMETHAZINE-DM) 6.25-15 MG/5ML syrup Take 5 mLs by mouth 4 (four) times daily as needed for cough. (Patient not taking: Reported on  01/19/2023)   No facility-administered medications prior to visit.    Review of Systems  All other systems reviewed and are negative.  Except see hpi {Labs  Heme  Chem  Endocrine  Serology  Results Review (optional):23779}   Objective    BP (!) 199/115 (BP Location: Right Arm, Patient Position: Sitting, Cuff Size: Large)   Pulse 70   Temp 99.1 F (37.3 C) (Oral)   Wt 230 lb (104.3 kg)   SpO2 96%   BMI 36.02 kg/m  {Show previous vital signs (optional):23777}  Physical Exam Vitals reviewed.  Constitutional:      General: She is not in acute distress.    Appearance: Normal appearance. She is well-developed. She is not diaphoretic.  HENT:     Head: Normocephalic and atraumatic.  Eyes:     General: No scleral icterus.    Conjunctiva/sclera: Conjunctivae normal.  Neck:     Thyroid: No thyromegaly.  Cardiovascular:     Rate and Rhythm: Normal rate and regular rhythm.     Pulses: Normal pulses.     Heart sounds: Normal heart sounds. No murmur heard. Pulmonary:     Effort: Pulmonary effort is normal. No respiratory distress.     Breath sounds: Normal breath sounds. No wheezing, rhonchi or rales.  Abdominal:     General: Abdomen is flat. Bowel sounds are normal.     Palpations: Abdomen is soft.  Musculoskeletal:     Cervical back: Neck supple.     Right lower leg: No edema.     Left lower leg: No edema.  Lymphadenopathy:     Cervical: No cervical adenopathy.  Skin:    General: Skin is warm and dry.     Findings: No rash.  Neurological:     Mental Status: She is alert and oriented to person, place, and time. Mental status is at baseline.  Psychiatric:        Mood and Affect: Mood normal.        Behavior: Behavior normal.        Thought Content: Thought content normal.        Judgment: Judgment normal.     No results found for any visits on 01/19/23.  Assessment & Plan     Primary osteoarthritis of both knees DDD (degenerative disc disease), lumbar Lumbar  radiculitis Chronic and worsening Initial workup, last labs were done in 2022 - CBC with Differential/Platelet - Comprehensive metabolic panel - Lipid panel - TSH - Ambulatory referral to Pain Clinic for pain control - Ambulatory referral to Orthopedics for evaluation and management Advised to use heat/ice/topical antiinflammatory/PT IN the past, pt had a few steroid shots without relief. Will FU  3. Primary hypertension Urgency Her BP was 199/155 -second reading Asymptomatic Endorses having BP at home  in 140-150 Was out of medication for a few days, per patient - CBC with Differential/Platelet - Comprehensive metabolic panel - Lipid  panel - TSH - Ambulatory referral to Pain Clinic - Ambulatory referral to Orthopedics - hydrochlorothiazide (HYDRODIURIL) 25 MG tablet; Take 1 tablet (25 mg total) by mouth 2 (two) times daily.  Dispense: 60 tablet; Refill: 0 - losartan (COZAAR) 50 MG tablet; Take 1 tab twice daily  Dispense: 60 tablet; Refill: 0 - hydrALAZINE (APRESOLINE) 25 MG tablet; Take 1 tablet (25 mg total) by mouth 3 (three) times daily.  Dispense: 90 tablet; Refill: 0 Pt was explained that she needs to start with losartan, add hydrochlorothiazide and finally hydralazine  Smoker Smoking cessation was addressed  Patient was advised to quit smoking  Patient denies smoking daily, "just in company" Will educate on risks of smoking again. Will reassess at the next appt   Obesity Chronic and BMI: Body mass index is 36.02 kg/m. Weight loss of 5% of pt's current weight via healthy diet and daily exercise encouraged.  Will FU  Return in about 3 days (around 01/22/2023).      The patient was advised to call back or seek an in-person evaluation if the symptoms worsen or if the condition fails to improve as anticipated.  I discussed the assessment and treatment plan with the patient. The patient was provided an opportunity to ask questions and all were answered. The patient  agreed with the plan and demonstrated an understanding of the instructions.  I, Mardene Speak, PA-C have reviewed all documentation for this visit. The documentation on  01/19/23 for the exam, diagnosis, procedures, and orders are all accurate and complete.  Mardene Speak, [facility], [place] [phone] (phone) [phone] (fax)   [place]

## 2023-01-20 DIAGNOSIS — G4710 Hypersomnia, unspecified: Secondary | ICD-10-CM | POA: Diagnosis not present

## 2023-01-20 DIAGNOSIS — I10 Essential (primary) hypertension: Secondary | ICD-10-CM | POA: Diagnosis not present

## 2023-01-20 DIAGNOSIS — M170 Bilateral primary osteoarthritis of knee: Secondary | ICD-10-CM | POA: Diagnosis not present

## 2023-01-20 DIAGNOSIS — M5416 Radiculopathy, lumbar region: Secondary | ICD-10-CM | POA: Diagnosis not present

## 2023-01-20 DIAGNOSIS — M5136 Other intervertebral disc degeneration, lumbar region: Secondary | ICD-10-CM | POA: Diagnosis not present

## 2023-01-21 ENCOUNTER — Ambulatory Visit (INDEPENDENT_AMBULATORY_CARE_PROVIDER_SITE_OTHER): Payer: BLUE CROSS/BLUE SHIELD | Admitting: Physician Assistant

## 2023-01-21 VITALS — BP 140/104 | HR 76 | Wt 226.0 lb

## 2023-01-21 DIAGNOSIS — I10 Essential (primary) hypertension: Secondary | ICD-10-CM

## 2023-01-21 DIAGNOSIS — E669 Obesity, unspecified: Secondary | ICD-10-CM

## 2023-01-21 DIAGNOSIS — E782 Mixed hyperlipidemia: Secondary | ICD-10-CM

## 2023-01-21 LAB — COMPREHENSIVE METABOLIC PANEL
ALT: 17 IU/L (ref 0–32)
AST: 18 IU/L (ref 0–40)
Albumin/Globulin Ratio: 2 (ref 1.2–2.2)
Albumin: 4.7 g/dL (ref 3.8–4.9)
Alkaline Phosphatase: 101 IU/L (ref 44–121)
BUN/Creatinine Ratio: 12 (ref 9–23)
BUN: 9 mg/dL (ref 6–24)
Bilirubin Total: 0.4 mg/dL (ref 0.0–1.2)
CO2: 22 mmol/L (ref 20–29)
Calcium: 10.9 mg/dL — ABNORMAL HIGH (ref 8.7–10.2)
Chloride: 99 mmol/L (ref 96–106)
Creatinine, Ser: 0.75 mg/dL (ref 0.57–1.00)
Globulin, Total: 2.4 g/dL (ref 1.5–4.5)
Glucose: 103 mg/dL — ABNORMAL HIGH (ref 70–99)
Potassium: 4.1 mmol/L (ref 3.5–5.2)
Sodium: 139 mmol/L (ref 134–144)
Total Protein: 7.1 g/dL (ref 6.0–8.5)
eGFR: 96 mL/min/{1.73_m2} (ref >59)

## 2023-01-21 LAB — CBC WITH DIFFERENTIAL/PLATELET
Basophils Absolute: 0 10*3/uL (ref 0.0–0.2)
Basos: 1 %
EOS (ABSOLUTE): 0.1 10*3/uL (ref 0.0–0.4)
Eos: 2 %
Hematocrit: 49.3 % — ABNORMAL HIGH (ref 34.0–46.6)
Hemoglobin: 17 g/dL — ABNORMAL HIGH (ref 11.1–15.9)
Immature Grans (Abs): 0 10*3/uL (ref 0.0–0.1)
Immature Granulocytes: 0 %
Lymphocytes Absolute: 2.1 10*3/uL (ref 0.7–3.1)
Lymphs: 24 %
MCH: 33.3 pg — ABNORMAL HIGH (ref 26.6–33.0)
MCHC: 34.5 g/dL (ref 31.5–35.7)
MCV: 97 fL (ref 79–97)
Monocytes Absolute: 0.4 10*3/uL (ref 0.1–0.9)
Monocytes: 5 %
Neutrophils Absolute: 6 10*3/uL (ref 1.4–7.0)
Neutrophils: 68 %
Platelets: 334 10*3/uL (ref 150–450)
RBC: 5.11 x10E6/uL (ref 3.77–5.28)
RDW: 12.5 % (ref 11.7–15.4)
WBC: 8.8 10*3/uL (ref 3.4–10.8)

## 2023-01-21 LAB — TSH: TSH: 0.712 u[IU]/mL (ref 0.450–4.500)

## 2023-01-21 LAB — LIPID PANEL
Chol/HDL Ratio: 6.1 ratio — ABNORMAL HIGH (ref 0.0–4.4)
Cholesterol, Total: 220 mg/dL — ABNORMAL HIGH (ref 100–199)
HDL: 36 mg/dL — ABNORMAL LOW (ref >39)
LDL Chol Calc (NIH): 134 mg/dL — ABNORMAL HIGH (ref 0–99)
Triglycerides: 277 mg/dL — ABNORMAL HIGH (ref 0–149)
VLDL Cholesterol Cal: 50 mg/dL — ABNORMAL HIGH (ref 5–40)

## 2023-01-21 MED ORDER — ATORVASTATIN CALCIUM 10 MG PO TABS: 10.0000 mg | ORAL_TABLET | Freq: Every day | ORAL | 3 refills | Status: DC

## 2023-01-21 NOTE — Progress Notes (Signed)
Please , let pt know that her results were stable except a slightly elevated blood sugar and elevated cholesterol.  Advised lifestyle modifications/diet and exercise and  The 10-year ASCVD risk score (for heart attack, stroke) is: 21% , she needs to start on statin. If she agrees, I will call in a medication. Please, confrim her pharmacy

## 2023-01-21 NOTE — Progress Notes (Signed)
Argentina Ponder DeSanto,acting as a Education administrator for [employer], PA-C.,have documented all relevant documentation on the behalf of Mardene Speak, PA-C,as directed by  [employer], PA-C while in the presence of [employer], PA-C.     Established patient visit   [name]   DOB: 1971-02-25   52 y.o. Female  MRN: [id number] Visit Date: 01/21/2023  Today's healthcare provider: Mardene Speak, PA-C   CC: HTN FU  Subjective    HPI  Hypertension, follow-up  BP Readings from Last 3 Encounters:  01/21/23 (!) 140/104  01/19/23 (!) 199/115  12/15/21 (!) 176/111   Wt Readings from Last 3 Encounters:  01/21/23 226 lb (102.5 kg)  01/19/23 230 lb (104.3 kg)  12/15/21 210 lb 1.6 oz (95.3 kg)     She was last seen for hypertension 2 days ago.  BP at that visit was as above. Management since that visit includes starting with Losartan then add HCTZ and then Hydralazine.  She reports good compliance with treatment. She is having side effects. Second and third dose of Hydralazine is causing her to have headaches.  Outside blood pressures are 180's  over 110's. Symptoms: No chest pain No chest pressure  No palpitations No syncope  No dyspnea No orthopnea  No paroxysmal nocturnal dyspnea No lower extremity edema   Pertinent labs Lab Results  Component Value Date   CHOL 220 (H) 01/20/2023   HDL 36 (L) 01/20/2023   LDLCALC 134 (H) 01/20/2023   TRIG 277 (H) 01/20/2023   CHOLHDL 6.1 (H) 01/20/2023   Lab Results  Component Value Date   NA 139 01/20/2023   K 4.1 01/20/2023   CREATININE 0.75 01/20/2023   EGFR 96 01/20/2023   GLUCOSE 103 (H) 01/20/2023   TSH 0.712 01/20/2023     The 10-year ASCVD risk score (Arnett DK, et al., 2019) is: 11%  ---------------------------------------------------------------------------------------------------   Medications: Outpatient Medications Prior to Visit  Medication Sig   hydrALAZINE (APRESOLINE) 25 MG tablet Take 1 tablet (25 mg  total) by mouth 3 (three) times daily.   hydrochlorothiazide (HYDRODIURIL) 25 MG tablet Take 1 tablet (25 mg total) by mouth 2 (two) times daily.   hydrOXYzine (ATARAX/VISTARIL) 10 MG tablet Take 1 tablet (10 mg total) by mouth 3 (three) times daily as needed.   losartan (COZAAR) 50 MG tablet Take 1 tab twice daily   methocarbamol (ROBAXIN) 500 MG tablet Take 1 tablet (500 mg total) by mouth 4 (four) times daily.   [DISCONTINUED] albuterol (VENTOLIN HFA) 108 (90 Base) MCG/ACT inhaler Inhale 2 puffs into the lungs every 6 (six) hours as needed for wheezing or shortness of breath. (Patient not taking: Reported on 01/19/2023)   [DISCONTINUED] busPIRone (BUSPAR) 7.5 MG tablet TAKE 1 TABLET(7.5 MG) BY MOUTH TWICE DAILY (Patient not taking: Reported on 01/19/2023)   [DISCONTINUED] predniSONE (STERAPRED UNI-PAK 21 TAB) 10 MG (21) TBPK tablet Take by mouth daily. Take 6 tabs by mouth daily  for 2 days, then 5 tabs for 2 days, then 4 tabs for 2 days, then 3 tabs for 2 days, 2 tabs for 2 days, then 1 tab by mouth daily for 2 days (Patient not taking: Reported on 01/19/2023)   [DISCONTINUED] promethazine-dextromethorphan (PROMETHAZINE-DM) 6.25-15 MG/5ML syrup Take 5 mLs by mouth 4 (four) times daily as needed for cough. (Patient not taking: Reported on 01/19/2023)   No facility-administered medications prior to visit.    Review of Systems  All other systems reviewed and are negative.  Except see hpi  Objective    BP (!) 140/104 (BP Location: Left Arm, Patient Position: Sitting, Cuff Size: Large)   Pulse 76   Wt 226 lb (102.5 kg)   SpO2 95%   BMI 35.40 kg/m  Vitals:   01/21/23 1523  BP: (!) 140/104  Pulse: 76  SpO2: 95%  Weight: 226 lb (102.5 kg)     Physical Exam Vitals reviewed.  Constitutional:      General: She is not in acute distress.    Appearance: Normal appearance. She is well-developed. She is not diaphoretic.  HENT:     Head: Normocephalic and atraumatic.  Eyes:     General: No  scleral icterus.    Conjunctiva/sclera: Conjunctivae normal.  Neck:     Thyroid: No thyromegaly.  Cardiovascular:     Rate and Rhythm: Normal rate and regular rhythm.     Pulses: Normal pulses.     Heart sounds: Normal heart sounds. No murmur heard. Pulmonary:     Effort: Pulmonary effort is normal. No respiratory distress.     Breath sounds: Normal breath sounds. No wheezing, rhonchi or rales.  Musculoskeletal:     Cervical back: Neck supple.     Right lower leg: No edema.     Left lower leg: No edema.  Lymphadenopathy:     Cervical: No cervical adenopathy.  Skin:    General: Skin is warm and dry.     Findings: No rash.  Neurological:     Mental Status: She is alert and oriented to person, place, and time. Mental status is at baseline.  Psychiatric:        Behavior: Behavior normal.        Thought Content: Thought content normal.        Judgment: Judgment normal.     No results found for any visits on 01/21/23.  Assessment & Plan     1. Mixed hyperlipidemia Chronic  Uncontrolled  LP from 01/20/23 showed elevated lipids The 10-year ASCVD risk score (Arnett DK, et al., 2019) is: 11%  We discussed starting statin, patient agreeable  - atorvastatin (LIPITOR) 10 MG tablet; Take 1 tablet (10 mg total) by mouth daily.  Dispense: 90 tablet; Refill: 3 Low-cholesterol diet advised Will Fu  2. Primary hypertension Chronic and Uncontrolled BP today was 140/104 , not at goal Continue losartan '50mg'$  Bid and HCTZ 25 mg. Add hydralazine if BP will stay high. Continue dietary sodium restrictions  Continue/Increase dietary efforts and physical activity.  Measure BP at home   3. Obesity (BMI 30-39.9) Chronic and BMI 35.40 today Pt needs to proceed with strict low-carb, low-fat diet and daily exercise. Will FU  4. Polycythemia vera Chronic Was diagnosed in 2017 Hemoglobin 17 and HCT 49 form 01/20/23 Per pt , it was being managed by Hematology. Will Fu   No follow-ups on file.      The patient was advised to call back or seek an in-person evaluation if the symptoms worsen or if the condition fails to improve as anticipated.  I discussed the assessment and treatment plan with the patient. The patient was provided an opportunity to ask questions and all were answered. The patient agreed with the plan and demonstrated an understanding of the instructions.  I, Mardene Speak, PA-C have reviewed all documentation for this visit. The documentation on 01/21/23 for the exam, diagnosis, procedures, and orders are all accurate and complete.  Mardene Speak, [facility], [place] [phone] (phone) [phone] (fax)   [place]

## 2023-01-23 ENCOUNTER — Encounter: Payer: Self-pay | Admitting: Physician Assistant

## 2023-02-05 ENCOUNTER — Ambulatory Visit (INDEPENDENT_AMBULATORY_CARE_PROVIDER_SITE_OTHER): Payer: BLUE CROSS/BLUE SHIELD | Admitting: Physician Assistant

## 2023-02-05 ENCOUNTER — Encounter: Payer: Self-pay | Admitting: Physician Assistant

## 2023-02-05 DIAGNOSIS — I10 Essential (primary) hypertension: Secondary | ICD-10-CM | POA: Diagnosis not present

## 2023-02-05 MED ORDER — HYDROCHLOROTHIAZIDE 25 MG PO TABS: 25.0000 mg | ORAL_TABLET | Freq: Every day | ORAL | 1 refills | Status: DC

## 2023-02-05 MED ORDER — LOSARTAN POTASSIUM 50 MG PO TABS: ORAL_TABLET | ORAL | 1 refills | Status: DC

## 2023-02-05 NOTE — Assessment & Plan Note (Signed)
Bp is elevated still in office Pt reports better at home, still 140s-150s/90s Advised her to restart hydralazine 25 mg TID and monitor bp closely. If anything <100/60 to d/c and contact office. Continue losartan 50 mg bid and hctz 25 mg daily F/u 4 weeks

## 2023-02-05 NOTE — Progress Notes (Signed)
I,Sha'taria Tyson,acting as a Education administrator for [employer], PA-C.,have documented all relevant documentation on the behalf of Mikey Kirschner, PA-C,as directed by  Mikey Kirschner, PA-C while in the presence of Mikey Kirschner, PA-C.   Established patient visit   [name]   DOB: 1971/09/12   52 y.o. Female  MRN: [id number] Visit Date: 02/05/2023  Today's healthcare provider: Mikey Kirschner, PA-C   Cc. Htn f/u  Subjective    HPI  Hypertension, follow-up  BP Readings from Last 3 Encounters:  02/05/23 (!) 179/108  01/21/23 (!) 140/104  01/19/23 (!) 199/115   Wt Readings from Last 3 Encounters:  02/05/23 227 lb 8 oz (103.2 kg)  01/21/23 226 lb (102.5 kg)  01/19/23 230 lb (104.3 kg)     She was last seen for hypertension 2 weeks ago.  BP at that visit was 140/104. Management since that visit includes continue losartan 50mg  Bid and HCTZ 25 mg. Add hydralazine if BP will stay high .  She reports excellent compliance with treatment. She is not having side effects.   Outside blood pressures are 150's/90's Symptoms: No chest pain No chest pressure  No palpitations No syncope  No dyspnea No orthopnea  No paroxysmal nocturnal dyspnea No lower extremity edema   Pertinent labs Lab Results  Component Value Date   CHOL 220 (H) 01/20/2023   HDL 36 (L) 01/20/2023   LDLCALC 134 (H) 01/20/2023   TRIG 277 (H) 01/20/2023   CHOLHDL 6.1 (H) 01/20/2023   Lab Results  Component Value Date   NA 139 01/20/2023   K 4.1 01/20/2023   CREATININE 0.75 01/20/2023   EGFR 96 01/20/2023   GLUCOSE 103 (H) 01/20/2023   TSH 0.712 01/20/2023     The 10-year ASCVD risk score (Arnett DK, et al., 2019) is: 17.4%  ---------------------------------------------------------------------------------------------------   Medications: Outpatient Medications Prior to Visit  Medication Sig   atorvastatin (LIPITOR) 10 MG tablet Take 1 tablet (10 mg total) by mouth daily.   hydrALAZINE  (APRESOLINE) 25 MG tablet Take 1 tablet (25 mg total) by mouth 3 (three) times daily.   methocarbamol (ROBAXIN) 500 MG tablet Take 1 tablet (500 mg total) by mouth 4 (four) times daily.   [DISCONTINUED] hydrochlorothiazide (HYDRODIURIL) 25 MG tablet Take 1 tablet (25 mg total) by mouth 2 (two) times daily.   [DISCONTINUED] hydrOXYzine (ATARAX/VISTARIL) 10 MG tablet Take 1 tablet (10 mg total) by mouth 3 (three) times daily as needed.   [DISCONTINUED] losartan (COZAAR) 50 MG tablet Take 1 tab twice daily   No facility-administered medications prior to visit.    Review of Systems  Constitutional:  Negative for fatigue and fever.  Respiratory:  Negative for cough and shortness of breath.   Cardiovascular:  Negative for chest pain and leg swelling.  Gastrointestinal:  Negative for abdominal pain.  Neurological:  Negative for dizziness and headaches.      Objective    BP (!) 179/108 (BP Location: Right Arm, Patient Position: Sitting, Cuff Size: Large)   Pulse 83   Wt 227 lb 8 oz (103.2 kg)   BMI 35.63 kg/m    Physical Exam Vitals reviewed.  Constitutional:      Appearance: She is not ill-appearing.  HENT:     Head: Normocephalic.  Eyes:     Conjunctiva/sclera: Conjunctivae normal.  Cardiovascular:     Rate and Rhythm: Normal rate and regular rhythm.     Pulses: Normal pulses.     Heart sounds: Normal heart sounds.  No murmur heard. Pulmonary:     Effort: Pulmonary effort is normal. No respiratory distress.  Neurological:     General: No focal deficit present.     Mental Status: She is alert and oriented to person, place, and time.  Psychiatric:        Mood and Affect: Mood normal.        Behavior: Behavior normal.      No results found for any visits on 02/05/23.  Assessment & Plan     Problem List Items Addressed This Visit       Cardiovascular and Mediastinum   Primary hypertension    Bp is elevated still in office Pt reports better at home, still  140s-150s/90s Advised her to restart hydralazine 25 mg TID and monitor bp closely. If anything <100/60 to d/c and contact office. Continue losartan 50 mg bid and hctz 25 mg daily F/u 4 weeks      Relevant Medications   losartan (COZAAR) 50 MG tablet   hydrochlorothiazide (HYDRODIURIL) 25 MG tablet     Return in about 4 weeks (around 03/05/2023) for hypertension w/ pcp.      I, Mikey Kirschner, PA-C have reviewed all documentation for this visit. The documentation on  02/05/23 for the exam, diagnosis, procedures, and orders are all accurate and complete.  Mikey Kirschner, PA-C [facility] [address] [place], [place], [address] Office: [phone] Fax: [place]

## 2023-02-08 DIAGNOSIS — M25562 Pain in left knee: Secondary | ICD-10-CM | POA: Diagnosis not present

## 2023-02-08 DIAGNOSIS — M25561 Pain in right knee: Secondary | ICD-10-CM | POA: Diagnosis not present

## 2023-02-08 DIAGNOSIS — M25571 Pain in right ankle and joints of right foot: Secondary | ICD-10-CM | POA: Diagnosis not present

## 2023-02-08 DIAGNOSIS — E669 Obesity, unspecified: Secondary | ICD-10-CM | POA: Diagnosis not present

## 2023-02-08 DIAGNOSIS — M129 Arthropathy, unspecified: Secondary | ICD-10-CM | POA: Diagnosis not present

## 2023-02-08 DIAGNOSIS — Z79899 Other long term (current) drug therapy: Secondary | ICD-10-CM | POA: Diagnosis not present

## 2023-02-08 DIAGNOSIS — M545 Low back pain, unspecified: Secondary | ICD-10-CM | POA: Diagnosis not present

## 2023-02-08 DIAGNOSIS — I10 Essential (primary) hypertension: Secondary | ICD-10-CM | POA: Diagnosis not present

## 2023-02-10 DIAGNOSIS — Z79899 Other long term (current) drug therapy: Secondary | ICD-10-CM | POA: Diagnosis not present

## 2023-02-15 DIAGNOSIS — M25562 Pain in left knee: Secondary | ICD-10-CM | POA: Diagnosis not present

## 2023-02-15 DIAGNOSIS — G8929 Other chronic pain: Secondary | ICD-10-CM | POA: Diagnosis not present

## 2023-02-15 DIAGNOSIS — E669 Obesity, unspecified: Secondary | ICD-10-CM | POA: Diagnosis not present

## 2023-02-15 DIAGNOSIS — E559 Vitamin D deficiency, unspecified: Secondary | ICD-10-CM | POA: Diagnosis not present

## 2023-02-15 DIAGNOSIS — M25561 Pain in right knee: Secondary | ICD-10-CM | POA: Diagnosis not present

## 2023-02-15 DIAGNOSIS — M25571 Pain in right ankle and joints of right foot: Secondary | ICD-10-CM | POA: Diagnosis not present

## 2023-02-15 DIAGNOSIS — I10 Essential (primary) hypertension: Secondary | ICD-10-CM | POA: Diagnosis not present

## 2023-02-15 DIAGNOSIS — F17210 Nicotine dependence, cigarettes, uncomplicated: Secondary | ICD-10-CM | POA: Diagnosis not present

## 2023-02-15 DIAGNOSIS — Z79899 Other long term (current) drug therapy: Secondary | ICD-10-CM | POA: Diagnosis not present

## 2023-02-17 DIAGNOSIS — Z79899 Other long term (current) drug therapy: Secondary | ICD-10-CM | POA: Diagnosis not present

## 2023-02-22 DIAGNOSIS — E669 Obesity, unspecified: Secondary | ICD-10-CM | POA: Diagnosis not present

## 2023-02-22 DIAGNOSIS — G8929 Other chronic pain: Secondary | ICD-10-CM | POA: Diagnosis not present

## 2023-02-22 DIAGNOSIS — M545 Low back pain, unspecified: Secondary | ICD-10-CM | POA: Diagnosis not present

## 2023-02-22 DIAGNOSIS — Z79899 Other long term (current) drug therapy: Secondary | ICD-10-CM | POA: Diagnosis not present

## 2023-02-22 DIAGNOSIS — E559 Vitamin D deficiency, unspecified: Secondary | ICD-10-CM | POA: Diagnosis not present

## 2023-02-22 DIAGNOSIS — M25562 Pain in left knee: Secondary | ICD-10-CM | POA: Diagnosis not present

## 2023-02-22 DIAGNOSIS — M25561 Pain in right knee: Secondary | ICD-10-CM | POA: Diagnosis not present

## 2023-02-22 DIAGNOSIS — M25571 Pain in right ankle and joints of right foot: Secondary | ICD-10-CM | POA: Diagnosis not present

## 2023-02-22 DIAGNOSIS — F17210 Nicotine dependence, cigarettes, uncomplicated: Secondary | ICD-10-CM | POA: Diagnosis not present

## 2023-02-22 DIAGNOSIS — I10 Essential (primary) hypertension: Secondary | ICD-10-CM | POA: Diagnosis not present

## 2023-02-25 DIAGNOSIS — Z79899 Other long term (current) drug therapy: Secondary | ICD-10-CM | POA: Diagnosis not present

## 2023-03-08 NOTE — Progress Notes (Signed)
Established patient visit   [name]   DOB: 1971/08/10   52 y.o. Female  MRN: [id number] Visit Date: 03/09/2023  Today's healthcare provider: Debera Lat, PA-C   Chief Complaint  Patient presents with   Medical Management of Chronic Issues    Uncontrolled HTN FU    Subjective    HPI   Hypertension, follow-up  BP Readings from Last 3 Encounters:  03/09/23 (!) 137/93  02/05/23 (!) 179/108  01/21/23 (!) 140/104   Wt Readings from Last 3 Encounters:  03/09/23 223 lb (101.2 kg)  02/05/23 227 lb 8 oz (103.2 kg)  01/21/23 226 lb (102.5 kg)     She was last seen for hypertension 4 weeks ago.  BP at that visit was 179/108. Management since that visit includes restart hydralazine 25 mg TID. Continue Losartan 50 mg BID and HCTZ 25 mg daily  She reports fair compliance with treatment. She is not having side effects.   Outside blood pressures are   Pertinent labs Lab Results  Component Value Date   CHOL 220 (H) 01/20/2023   HDL 36 (L) 01/20/2023   LDLCALC 134 (H) 01/20/2023   TRIG 277 (H) 01/20/2023   CHOLHDL 6.1 (H) 01/20/2023   Lab Results  Component Value Date   NA 139 01/20/2023   K 4.1 01/20/2023   CREATININE 0.75 01/20/2023   EGFR 96 01/20/2023   GLUCOSE 103 (H) 01/20/2023   TSH 0.712 01/20/2023     The 10-year ASCVD risk score (Arnett DK, et al., 2019) is: 10.8%  ---------------------------------------------------------------------------------------------------    03/09/2023    2:28 PM 02/05/2023    1:10 PM 07/29/2021    1:41 PM  PHQ9 SCORE ONLY  PHQ-9 Total Score 2 2      Information is confidential and restricted. Go to Review Flowsheets to unlock data.     Medications: Outpatient Medications Prior to Visit  Medication Sig   atorvastatin (LIPITOR) 10 MG tablet Take 1 tablet (10 mg total) by mouth daily.   hydrALAZINE (APRESOLINE) 25 MG tablet Take 1 tablet (25 mg total) by mouth 3 (three) times daily.   hydrochlorothiazide  (HYDRODIURIL) 25 MG tablet Take 1 tablet (25 mg total) by mouth daily.   losartan (COZAAR) 50 MG tablet Take 1 tab twice daily   methocarbamol (ROBAXIN) 500 MG tablet Take 1 tablet (500 mg total) by mouth 4 (four) times daily.   oxyCODONE-acetaminophen (PERCOCET/ROXICET) 5-325 MG tablet Take 1 tablet by mouth every 6 (six) hours as needed.   Vitamin D, Ergocalciferol, (DRISDOL) 1.25 MG (50000 UNIT) CAPS capsule Take 50,000 Units by mouth once a week.   No facility-administered medications prior to visit.    Review of Systems  Constitutional:  Negative for appetite change and fatigue.  Eyes:  Negative for visual disturbance.  Respiratory:  Negative for chest tightness and shortness of breath.   Cardiovascular:  Negative for chest pain, palpitations and leg swelling.  Gastrointestinal:  Negative for abdominal pain and nausea.  Neurological:  Negative for dizziness, light-headedness and headaches.       Objective    BP (!) 137/93 (BP Location: Right Arm, Patient Position: Sitting, Cuff Size: Large)   Pulse 78   Ht 5\' 7"  (1.702 m)   Wt 223 lb (101.2 kg)   SpO2 97%   BMI 34.93 kg/m  BP Readings from Last 3 Encounters:  03/09/23 (!) 137/93  02/05/23 (!) 179/108  01/21/23 (!) 140/104   Wt Readings from Last 3  Encounters:  03/09/23 223 lb (101.2 kg)  02/05/23 227 lb 8 oz (103.2 kg)  01/21/23 226 lb (102.5 kg)      Physical Exam Vitals reviewed.  Constitutional:      General: She is not in acute distress.    Appearance: Normal appearance. She is well-developed. She is not diaphoretic.  HENT:     Head: Normocephalic and atraumatic.  Eyes:     General: No scleral icterus.    Extraocular Movements: Extraocular movements intact.     Conjunctiva/sclera: Conjunctivae normal.     Pupils: Pupils are equal, round, and reactive to light.  Neck:     Thyroid: No thyromegaly.  Cardiovascular:     Rate and Rhythm: Normal rate and regular rhythm.     Pulses: Normal pulses.     Heart  sounds: Normal heart sounds. No murmur heard. Pulmonary:     Effort: Pulmonary effort is normal. No respiratory distress.     Breath sounds: Normal breath sounds. No wheezing, rhonchi or rales.  Abdominal:     General: Abdomen is flat. Bowel sounds are normal.     Palpations: Abdomen is soft.  Musculoskeletal:     Cervical back: Neck supple.     Right lower leg: No edema.     Left lower leg: No edema.  Lymphadenopathy:     Cervical: No cervical adenopathy.  Skin:    General: Skin is warm and dry.     Findings: No rash.  Neurological:     Mental Status: She is alert and oriented to person, place, and time. Mental status is at baseline.  Psychiatric:        Behavior: Behavior normal.        Thought Content: Thought content normal.        Judgment: Judgment normal.      No results found for any visits on 03/09/23.  Assessment & Plan     Primary hypertension Chronic and previously uncontrolled Continue dietary sodium restrictions  Continue/Increase dietary efforts and physical activity.  Continue BP log Continue taking losartan 50 Mg twice daily and hold here to close he has a 25 Mg daily as well as that hydralazine 25 Mg 3 times daily.  Patient reported noncompliance with hydralazine. Discussed importance of blood pressure control, strongly recommended to comply with use current medication regimen Will follow-up  Mixed hyperlipidemia Chronic  Uncontrolled  The 10-year ASCVD risk score (Arnett DK, et al., 2019) is: 10.8% Continue taking atorvastatin 10 Mg daily Continue low-fat, low-calorie diet and regular exercise Will follow-up  Obesity (BMI 30.0-34.9) Chronic and BMI 3 4. 93 today Pt needs to proceed with strict low-carb, low-fat diet and daily exercise. Weight loss of 5% of pt's current weight via healthy diet and daily exercise encouraged.  Will reassess  Smoker Smoking cessation was addressed  Patient was advised to quit smoking  Patient seems  reluctant. Will educate on risks of smoking again. Will reassess at the next appt    DDD (degenerative disc disease), lumbar Lumbar radiculitis Chronic knee pain Chronic and stable Managed by pain management Robaxin 500 mg will be started by pain management   Return in about 3 months (around 06/08/2023) for chronic disease f/u. Distal femur     The patient was advised to call back or seek an in-person evaluation if the symptoms worsen or if the condition fails to improve as anticipated.  I discussed the assessment and treatment plan with the patient. The patient was provided an opportunity to  ask questions and all were answered. The patient agreed with the plan and demonstrated an understanding of the instructions.  I, Debera Lat, PA-C have reviewed all documentation for this visit. The documentation on 03/06/2023 for the exam, diagnosis, procedures, and orders are all accurate and complete.  Debera Lat, [facility], MMS [facility] [phone] (phone) [phone] (fax)   [facility]

## 2023-03-09 ENCOUNTER — Ambulatory Visit (INDEPENDENT_AMBULATORY_CARE_PROVIDER_SITE_OTHER): Payer: BLUE CROSS/BLUE SHIELD | Admitting: Physician Assistant

## 2023-03-09 ENCOUNTER — Encounter: Payer: Self-pay | Admitting: Physician Assistant

## 2023-03-09 VITALS — BP 137/93 | HR 78 | Ht 67.0 in | Wt 223.0 lb

## 2023-03-09 DIAGNOSIS — F32A Depression, unspecified: Secondary | ICD-10-CM

## 2023-03-09 DIAGNOSIS — E669 Obesity, unspecified: Secondary | ICD-10-CM

## 2023-03-09 DIAGNOSIS — I10 Essential (primary) hypertension: Secondary | ICD-10-CM | POA: Diagnosis not present

## 2023-03-09 DIAGNOSIS — M25569 Pain in unspecified knee: Secondary | ICD-10-CM | POA: Diagnosis not present

## 2023-03-09 DIAGNOSIS — M5416 Radiculopathy, lumbar region: Secondary | ICD-10-CM | POA: Diagnosis not present

## 2023-03-09 DIAGNOSIS — G8929 Other chronic pain: Secondary | ICD-10-CM

## 2023-03-09 DIAGNOSIS — M5136 Other intervertebral disc degeneration, lumbar region: Secondary | ICD-10-CM | POA: Diagnosis not present

## 2023-03-09 DIAGNOSIS — F17200 Nicotine dependence, unspecified, uncomplicated: Secondary | ICD-10-CM

## 2023-03-09 DIAGNOSIS — E782 Mixed hyperlipidemia: Secondary | ICD-10-CM | POA: Diagnosis not present

## 2023-03-10 NOTE — Assessment & Plan Note (Signed)
Chronic and previously uncontrolled Blood pressure today was 137/93 on a second reading Continue taking losartan 50 Mg twice daily, hydrochlorothiazide 25 Mg daily. Hydralazine 25 Mg up to 3 times daily was added at her last visit.  Continue taking hydralazine  Continue measuring blood pressure at home Continue low-sodium restrictions and regular exercise Will follow-up in 3 months

## 2023-03-10 NOTE — Assessment & Plan Note (Signed)
Chronic and unstable. He last LDL was 134. Goal for LDL <70. The 10-year ASCVD risk score (Arnett DK, et al., 2019) is: 10.8% .  Increased dose of atorvastatin from 10 Mg to 20 Mg.  Contact us if she needs refill for statin. Continue low-fat diet and daily exercise Will follow-up in 3 months

## 2023-03-22 ENCOUNTER — Other Ambulatory Visit: Payer: Self-pay | Admitting: Physician Assistant

## 2023-03-22 DIAGNOSIS — M545 Low back pain, unspecified: Secondary | ICD-10-CM | POA: Diagnosis not present

## 2023-03-22 DIAGNOSIS — F17210 Nicotine dependence, cigarettes, uncomplicated: Secondary | ICD-10-CM | POA: Diagnosis not present

## 2023-03-22 DIAGNOSIS — E559 Vitamin D deficiency, unspecified: Secondary | ICD-10-CM | POA: Diagnosis not present

## 2023-03-22 DIAGNOSIS — I10 Essential (primary) hypertension: Secondary | ICD-10-CM | POA: Diagnosis not present

## 2023-03-22 DIAGNOSIS — E669 Obesity, unspecified: Secondary | ICD-10-CM | POA: Diagnosis not present

## 2023-03-22 DIAGNOSIS — G8929 Other chronic pain: Secondary | ICD-10-CM | POA: Diagnosis not present

## 2023-03-22 DIAGNOSIS — M25562 Pain in left knee: Secondary | ICD-10-CM | POA: Diagnosis not present

## 2023-03-22 DIAGNOSIS — Z780 Asymptomatic menopausal state: Secondary | ICD-10-CM | POA: Diagnosis not present

## 2023-03-22 DIAGNOSIS — Z79899 Other long term (current) drug therapy: Secondary | ICD-10-CM | POA: Diagnosis not present

## 2023-03-22 DIAGNOSIS — M25561 Pain in right knee: Secondary | ICD-10-CM | POA: Diagnosis not present

## 2023-03-22 DIAGNOSIS — M25571 Pain in right ankle and joints of right foot: Secondary | ICD-10-CM | POA: Diagnosis not present

## 2023-03-23 ENCOUNTER — Other Ambulatory Visit: Payer: Self-pay | Admitting: Physician Assistant

## 2023-03-23 DIAGNOSIS — I10 Essential (primary) hypertension: Secondary | ICD-10-CM

## 2023-03-23 MED ORDER — HYDRALAZINE HCL 25 MG PO TABS: 25.0000 mg | ORAL_TABLET | Freq: Three times a day (TID) | ORAL | 1 refills | Status: DC

## 2023-03-23 NOTE — Telephone Encounter (Signed)
Requested Prescriptions  Pending Prescriptions Disp Refills   hydrALAZINE (APRESOLINE) 25 MG tablet 270 tablet 0    Sig: Take 1 tablet (25 mg total) by mouth 3 (three) times daily.     Cardiovascular:  Vasodilators Failed - 03/23/2023  4:54 PM      Failed - HCT in normal range and within 360 days    Hematocrit  Date Value Ref Range Status  01/20/2023 49.3 (H) 34.0 - 46.6 % Final         Failed - HGB in normal range and within 360 days    Hemoglobin  Date Value Ref Range Status  01/20/2023 17.0 (H) 11.1 - 15.9 g/dL Final         Failed - ANA Screen, Ifa, Serum in normal range and within 360 days    Anti Nuclear Antibody (ANA)  Date Value Ref Range Status  03/05/2021 Negative Negative Final         Failed - Last BP in normal range    BP Readings from Last 1 Encounters:  03/09/23 (!) 137/93         Passed - RBC in normal range and within 360 days    RBC  Date Value Ref Range Status  01/20/2023 5.11 3.77 - 5.28 x10E6/uL Final  06/03/2021 5.02 3.87 - 5.11 MIL/uL Final         Passed - WBC in normal range and within 360 days    WBC  Date Value Ref Range Status  01/20/2023 8.8 3.4 - 10.8 x10E3/uL Final  06/03/2021 8.4 4.0 - 10.5 K/uL Final         Passed - PLT in normal range and within 360 days    Platelets  Date Value Ref Range Status  01/20/2023 334 150 - 450 x10E3/uL Final         Passed - Valid encounter within last 12 months    Recent Outpatient Visits           2 weeks ago Primary hypertension   [place] [facility] [place], [place], PA-C   1 month ago Primary hypertension   [place] [facility] Alfredia Ferguson, PA-C   2 months ago Mixed hyperlipidemia   [place] [facility] [place], [place], PA-C   2 months ago Primary osteoarthritis of both knees   [place] [facility] [place], [place], PA-C   1 year ago DDD (degenerative disc disease), lumbar   [place] [facility]  [place], Jodell Cipro, PA-C       Future Appointments             In 2 months [place], Edmon Crape, PA-C [facility]

## 2023-03-23 NOTE — Telephone Encounter (Signed)
Medication Refill - Medication: hydrALAZINE (APRESOLINE) 25 MG tablet  Has the patient contacted their pharmacy? Yes.   Pt told to contact provider  Preferred Pharmacy (with phone number or street name):  [id number] - [place], [place] - [address] AT [facility] OF [address] [facility] Phone: [phone]  Fax: [phone]     Has the patient been seen for an appointment in the last year OR does the patient have an upcoming appointment? Yes.    Agent: Please be advised that RX refills may take up to 3 business days. We ask that you follow-up with your pharmacy.

## 2023-03-24 DIAGNOSIS — Z79899 Other long term (current) drug therapy: Secondary | ICD-10-CM | POA: Diagnosis not present

## 2023-04-09 ENCOUNTER — Encounter: Payer: Self-pay | Admitting: Internal Medicine

## 2023-05-19 DIAGNOSIS — M25571 Pain in right ankle and joints of right foot: Secondary | ICD-10-CM | POA: Diagnosis not present

## 2023-05-19 DIAGNOSIS — Z1231 Encounter for screening mammogram for malignant neoplasm of breast: Secondary | ICD-10-CM | POA: Diagnosis not present

## 2023-05-19 DIAGNOSIS — M25561 Pain in right knee: Secondary | ICD-10-CM | POA: Diagnosis not present

## 2023-05-19 DIAGNOSIS — E669 Obesity, unspecified: Secondary | ICD-10-CM | POA: Diagnosis not present

## 2023-05-19 DIAGNOSIS — F17210 Nicotine dependence, cigarettes, uncomplicated: Secondary | ICD-10-CM | POA: Diagnosis not present

## 2023-05-19 DIAGNOSIS — G8929 Other chronic pain: Secondary | ICD-10-CM | POA: Diagnosis not present

## 2023-05-19 DIAGNOSIS — M25562 Pain in left knee: Secondary | ICD-10-CM | POA: Diagnosis not present

## 2023-05-19 DIAGNOSIS — I10 Essential (primary) hypertension: Secondary | ICD-10-CM | POA: Diagnosis not present

## 2023-05-19 DIAGNOSIS — Z79899 Other long term (current) drug therapy: Secondary | ICD-10-CM | POA: Diagnosis not present

## 2023-05-19 DIAGNOSIS — E559 Vitamin D deficiency, unspecified: Secondary | ICD-10-CM | POA: Diagnosis not present

## 2023-05-19 DIAGNOSIS — M545 Low back pain, unspecified: Secondary | ICD-10-CM | POA: Diagnosis not present

## 2023-05-24 DIAGNOSIS — Z79899 Other long term (current) drug therapy: Secondary | ICD-10-CM | POA: Diagnosis not present

## 2023-06-03 ENCOUNTER — Other Ambulatory Visit: Payer: Self-pay | Admitting: Physician Assistant

## 2023-06-03 DIAGNOSIS — I10 Essential (primary) hypertension: Secondary | ICD-10-CM

## 2023-06-16 ENCOUNTER — Encounter: Payer: Self-pay | Admitting: Physician Assistant

## 2023-06-16 ENCOUNTER — Ambulatory Visit (INDEPENDENT_AMBULATORY_CARE_PROVIDER_SITE_OTHER): Payer: BLUE CROSS/BLUE SHIELD | Admitting: Physician Assistant

## 2023-06-16 VITALS — BP 132/98 | HR 74 | Temp 98.6°F | Ht 67.0 in | Wt 218.0 lb

## 2023-06-16 DIAGNOSIS — M5136 Other intervertebral disc degeneration, lumbar region: Secondary | ICD-10-CM

## 2023-06-16 DIAGNOSIS — I10 Essential (primary) hypertension: Secondary | ICD-10-CM | POA: Diagnosis not present

## 2023-06-16 DIAGNOSIS — E782 Mixed hyperlipidemia: Secondary | ICD-10-CM | POA: Diagnosis not present

## 2023-06-16 DIAGNOSIS — D45 Polycythemia vera: Secondary | ICD-10-CM

## 2023-06-16 DIAGNOSIS — D6851 Activated protein C resistance: Secondary | ICD-10-CM | POA: Diagnosis not present

## 2023-06-16 DIAGNOSIS — F17200 Nicotine dependence, unspecified, uncomplicated: Secondary | ICD-10-CM | POA: Diagnosis not present

## 2023-06-16 DIAGNOSIS — E669 Obesity, unspecified: Secondary | ICD-10-CM | POA: Diagnosis not present

## 2023-06-16 NOTE — Progress Notes (Unsigned)
  Established patient visit  [name]   DOB: [birth year]   52 y.o. Female  MRN: [id number] Visit Date: 06/16/2023  Today's healthcare provider: Debera Lat, PA-C   No chief complaint on file.  Subjective    HPI  *** Discussed the use of AI scribe software for clinical note transcription with the patient, who gave verbal consent to proceed.  History of Present Illness               06/16/2023   10:36 AM 03/09/2023    2:28 PM 02/05/2023    1:10 PM  Depression screen PHQ 2/9  Decreased Interest 0 0 0  Down, Depressed, Hopeless 0 0 0  PHQ - 2 Score 0 0 0  Altered sleeping 1 1 1   Tired, decreased energy 0 1 1  Change in appetite 0 0 0  Feeling bad or failure about yourself  0 0 0  Trouble concentrating 0 0 0  Moving slowly or fidgety/restless 0 0 0  Suicidal thoughts 0 0 0  PHQ-9 Score 1 2 2   Difficult doing work/chores  Not difficult at all Not difficult at all      04/29/2021    5:46 PM  GAD 7 : Generalized Anxiety Score  Nervous, Anxious, on Edge   Control/stop worrying   Worry too much - different things   Trouble relaxing   Restless   Easily annoyed or irritable   Afraid - awful might happen   Total GAD 7 Score   Anxiety Difficulty      Information is confidential and restricted. Go to Review Flowsheets to unlock data.    Medications: Outpatient Medications Prior to Visit  Medication Sig   atorvastatin (LIPITOR) 10 MG tablet Take 1 tablet (10 mg total) by mouth daily.   famotidine (PEPCID) 40 MG tablet Take 40 mg by mouth daily. Take one tablet by mouth in the evening   hydrALAZINE (APRESOLINE) 25 MG tablet TAKE 1 TABLET BY MOUTH 3 TIMES DAILY   hydrochlorothiazide (HYDRODIURIL) 25 MG tablet TAKE 1 TABLET BY MOUTH ONCE DAILY   losartan (COZAAR) 50 MG tablet TAKE 1 TABLET BY MOUTH TWICE DAILY   methocarbamol (ROBAXIN) 500 MG tablet Take 1 tablet (500 mg total) by mouth 4 (four) times daily.   oxyCODONE-acetaminophen (PERCOCET/ROXICET) 5-325 MG  tablet Take 1 tablet by mouth every 6 (six) hours as needed. Take one tablet 5x daily   Vitamin D, Ergocalciferol, (DRISDOL) 1.25 MG (50000 UNIT) CAPS capsule Take 50,000 Units by mouth once a week.   No facility-administered medications prior to visit.    Review of Systems  All other systems reviewed and are negative.  Except see HPI   {Insert previous labs (optional):23779}  {See past labs  Heme  Chem  Endocrine  Serology  Results Review (optional):1}   Objective    BP (!) 132/98 (BP Location: Left Arm, Patient Position: Sitting, Cuff Size: Large)   Pulse 74   Temp 98.6 F (37 C) (Oral)   Ht 5\' 7"  (1.702 m)   Wt 218 lb (98.9 kg)   SpO2 95%   BMI 34.14 kg/m  {Insert last BP/Wt (optional):23777}  {See vitals history (optional):1}  Physical Exam   No results found for any visits on 06/16/23.  Assessment & Plan    *** Assessment and Plan              No follow-ups on file.      [facility]

## 2023-06-17 DIAGNOSIS — E669 Obesity, unspecified: Secondary | ICD-10-CM | POA: Diagnosis not present

## 2023-06-17 DIAGNOSIS — M25562 Pain in left knee: Secondary | ICD-10-CM | POA: Diagnosis not present

## 2023-06-17 DIAGNOSIS — G8929 Other chronic pain: Secondary | ICD-10-CM | POA: Diagnosis not present

## 2023-06-17 DIAGNOSIS — M25571 Pain in right ankle and joints of right foot: Secondary | ICD-10-CM | POA: Diagnosis not present

## 2023-06-17 DIAGNOSIS — M545 Low back pain, unspecified: Secondary | ICD-10-CM | POA: Diagnosis not present

## 2023-06-17 DIAGNOSIS — Z79899 Other long term (current) drug therapy: Secondary | ICD-10-CM | POA: Diagnosis not present

## 2023-06-17 DIAGNOSIS — F17210 Nicotine dependence, cigarettes, uncomplicated: Secondary | ICD-10-CM | POA: Diagnosis not present

## 2023-06-17 DIAGNOSIS — M25561 Pain in right knee: Secondary | ICD-10-CM | POA: Diagnosis not present

## 2023-07-14 DIAGNOSIS — M25571 Pain in right ankle and joints of right foot: Secondary | ICD-10-CM | POA: Diagnosis not present

## 2023-07-14 DIAGNOSIS — M25561 Pain in right knee: Secondary | ICD-10-CM | POA: Diagnosis not present

## 2023-07-14 DIAGNOSIS — Z79899 Other long term (current) drug therapy: Secondary | ICD-10-CM | POA: Diagnosis not present

## 2023-07-14 DIAGNOSIS — M25562 Pain in left knee: Secondary | ICD-10-CM | POA: Diagnosis not present

## 2023-07-14 DIAGNOSIS — F17210 Nicotine dependence, cigarettes, uncomplicated: Secondary | ICD-10-CM | POA: Diagnosis not present

## 2023-07-14 DIAGNOSIS — E559 Vitamin D deficiency, unspecified: Secondary | ICD-10-CM | POA: Diagnosis not present

## 2023-07-14 DIAGNOSIS — I10 Essential (primary) hypertension: Secondary | ICD-10-CM | POA: Diagnosis not present

## 2023-07-14 DIAGNOSIS — M545 Low back pain, unspecified: Secondary | ICD-10-CM | POA: Diagnosis not present

## 2023-07-14 DIAGNOSIS — E669 Obesity, unspecified: Secondary | ICD-10-CM | POA: Diagnosis not present

## 2023-07-16 DIAGNOSIS — Z79899 Other long term (current) drug therapy: Secondary | ICD-10-CM | POA: Diagnosis not present

## 2023-07-20 ENCOUNTER — Encounter: Payer: Self-pay | Admitting: Emergency Medicine

## 2023-07-20 ENCOUNTER — Emergency Department
Admission: EM | Admit: 2023-07-20 | Discharge: 2023-07-20 | Disposition: A | Discharge status: Home / Self Care | Payer: Medicaid Other | Attending: Emergency Medicine | Admitting: Emergency Medicine

## 2023-07-20 ENCOUNTER — Other Ambulatory Visit: Payer: Self-pay

## 2023-07-20 ENCOUNTER — Emergency Department: Payer: Medicaid Other

## 2023-07-20 DIAGNOSIS — M1712 Unilateral primary osteoarthritis, left knee: Secondary | ICD-10-CM | POA: Diagnosis not present

## 2023-07-20 DIAGNOSIS — I10 Essential (primary) hypertension: Secondary | ICD-10-CM | POA: Insufficient documentation

## 2023-07-20 DIAGNOSIS — F17200 Nicotine dependence, unspecified, uncomplicated: Secondary | ICD-10-CM | POA: Insufficient documentation

## 2023-07-20 DIAGNOSIS — M25462 Effusion, left knee: Secondary | ICD-10-CM | POA: Diagnosis not present

## 2023-07-20 DIAGNOSIS — W108XXA Fall (on) (from) other stairs and steps, initial encounter: Secondary | ICD-10-CM | POA: Diagnosis not present

## 2023-07-20 DIAGNOSIS — M25561 Pain in right knee: Secondary | ICD-10-CM | POA: Diagnosis not present

## 2023-07-20 NOTE — ED Provider Notes (Signed)
[facility] Provider Note    Event Date/Time   First MD Initiated Contact with Patient 07/20/23 1049     (approximate)   History   Fall   HPI  Michelle Simpson is a 52 y.o. female with a past medical history of obesity, polycythemia, chondromalacia of the left knee, degenerative disc disease, hypertension, followed by the pain management clinic who presents today for evaluation of knee pain.  Patient reports that she had a slip and fall and landed directly onto her left knee last night.  She reports that she has been able to bear weight with pain.  She does not have any numbness or tingling.  She reports that she took her home oxycodone, Tylenol, and muscle relaxant already today.  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   Chronic pelvic pain in female 03/20/2016   Allergic rhinitis 03/20/2016   Hyperlipidemia 03/20/2016   Osteoarthritis of hip 03/20/2016   Heterozygous factor V Leiden mutation (HCC) 03/20/2016   History of deep venous thrombosis 03/20/2016   Primary osteoarthritis of both knees 10/23/2014   DDD (degenerative disc disease), lumbar 10/04/2014   Chondromalacia of knee, 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   Neuritis or radiculitis due to rupture of lumbar intervertebral disc 09/12/2014   Lumbar radiculitis 09/12/2014   Edema of lower extremity 09/05/2014   Bilateral leg edema 09/05/2014   Hip impingement syndrome 06/19/2014   Endometriosis 05/06/2010   Primary hypertension 03/11/2009           Physical Exam   Triage Vital Signs: ED Triage Vitals  Encounter Vitals Group     BP 07/20/23 1043 (!) 170/101     Systolic BP Percentile --      Diastolic BP Percentile --      Pulse Rate 07/20/23 1043 88     Resp 07/20/23 1043 18     Temp 07/20/23 1043 98 F (36.7 C)     Temp Source 07/20/23 1043 Oral     SpO2 07/20/23 1043 94 %     Weight 07/20/23 1044 220 lb (99.8 kg)     Height 07/20/23 1044 5\' 7"  (1.702 m)     Head Circumference --      Peak Flow --      Pain Score 07/20/23 1044 8     Pain Loc --      Pain Education --      Exclude from Growth Chart --     Most recent vital signs: Vitals:   07/20/23 1043  BP: (!) 170/101  Pulse: 88  Resp: 18  Temp: 98 F (36.7 C)  SpO2: 94%    Physical Exam Vitals and nursing note reviewed.  Constitutional:      General: Awake and alert. No acute distress.    Appearance: Normal appearance. The patient is normal weight.  HENT:     Head: Normocephalic and atraumatic.     Mouth: Mucous membranes are moist.  Eyes:     General: PERRL. Normal EOMs        Right  eye: No discharge.        Left eye: No discharge.     Conjunctiva/sclera: Conjunctivae normal.  Cardiovascular:     Rate and Rhythm: Normal rate and regular rhythm.     Pulses: Normal pulses.  Pulmonary:     Effort: Pulmonary effort is normal. No respiratory distress.     Breath sounds: Normal breath sounds.  Abdominal:     Abdomen is soft. There is no abdominal tenderness. No rebound or guarding. No distention. Musculoskeletal:        General: No swelling. Normal range of motion.     Cervical back: Normal range of motion and neck supple.  Right knee: No deformity or rash.  Lateral joint line tenderness. No patellar tenderness, no ballotment Warm and well perfused extremity with 2+ pedal pulses 5/5 strength to dorsiflexion and plantarflexion at the ankle with intact sensation throughout extremity Normal range of motion of the knee, with intact flexion  and extension to active and passive range of motion. Extensor mechanism intact. No ligamentous laxity. Negative anterior/posterior drawer/negative lachman. No effusion or warmth or erythema Intact quadriceps, hamstring function, patellar tendon function Pelvis stable Full ROM of ankle without pain or swelling Foot warm and well perfused Skin:    General: Skin is warm and dry.     Capillary Refill: Capillary refill takes less than 2 seconds.     Findings: No rash.  Neurological:     Mental Status: The patient is awake and alert.      ED Results / Procedures / Treatments   Labs (all labs ordered are listed, but only abnormal results are displayed) Labs Reviewed - No data to display   EKG     RADIOLOGY I independently reviewed and interpreted imaging and agree with radiologists findings.     PROCEDURES:  Critical Care performed:   Procedures   MEDICATIONS ORDERED IN ED: Medications - No data to display   IMPRESSION / MDM / ASSESSMENT AND PLAN / ED COURSE  I reviewed the triage vital signs and the nursing notes.   Differential diagnosis includes, but is not limited to, fracture, ligament injury, meniscus injury, less likely dislocation.  Patient is awake and alert, hemodynamically stable and afebrile.  She is neurovascularly intact.  She has normal distal pulses, sensation is intact light touch and equal to opposite throughout leg, does not complain of paresthesias, has no popliteal pain, and no deformity at the time of the accident, do not suspect knee dislocation/popliteal vessel injury.  She is able to perform a straight leg raise, I do not suspect patellar or quadriceps tendon rupture.  X-ray obtained is negative for acute injury does reveal her known chondromalacia, as well as tricompartmental osteoarthritis and a very small knee joint effusion.  There is no warmth or erythema or fever, and she is able to range her knee, and ambulate, I do not suspect septic joint.   She was placed in a knee brace with improvement of her symptoms.  Recommended close outpatient follow-up with orthopedics, she reports that she sees Dr. Hyacinth Meeker with [facility].  We discussed return precautions and outpatient follow-up.  Patient understands and agrees with plan.  She was discharged in stable condition.   Patient's presentation is most consistent with acute complicated illness / injury requiring diagnostic workup.      FINAL CLINICAL IMPRESSION(S) / ED DIAGNOSES   Final diagnoses:  Acute pain of right knee     Rx / DC Orders   ED Discharge Orders  None        Note:  This document was prepared using Dragon voice recognition software and may include unintentional dictation errors.   Keturah Shavers 07/20/23 1259    Jene Every, MD 07/20/23 [phone]

## 2023-07-20 NOTE — ED Notes (Signed)
See triage note  Presents with pain to left knee s/p fall  Denies nay other pain

## 2023-07-20 NOTE — Discharge Instructions (Signed)
You may wear the knee brace for extra support.  Please follow-up with orthopedics.  Please return for any new, worsening, or change in symptoms or other concerns.  It was a pleasure caring for you today.

## 2023-07-20 NOTE — ED Triage Notes (Signed)
Patient to ED via POV after a fall. Patient states she fell down steps- denies hitting head or LOC. C/o left knee pain.

## 2023-08-11 DIAGNOSIS — Z131 Encounter for screening for diabetes mellitus: Secondary | ICD-10-CM | POA: Diagnosis not present

## 2023-08-11 DIAGNOSIS — M25571 Pain in right ankle and joints of right foot: Secondary | ICD-10-CM | POA: Diagnosis not present

## 2023-08-11 DIAGNOSIS — E669 Obesity, unspecified: Secondary | ICD-10-CM | POA: Diagnosis not present

## 2023-08-11 DIAGNOSIS — I10 Essential (primary) hypertension: Secondary | ICD-10-CM | POA: Diagnosis not present

## 2023-08-11 DIAGNOSIS — Z79899 Other long term (current) drug therapy: Secondary | ICD-10-CM | POA: Diagnosis not present

## 2023-08-11 DIAGNOSIS — Z23 Encounter for immunization: Secondary | ICD-10-CM | POA: Diagnosis not present

## 2023-08-11 DIAGNOSIS — E559 Vitamin D deficiency, unspecified: Secondary | ICD-10-CM | POA: Diagnosis not present

## 2023-08-11 DIAGNOSIS — F17210 Nicotine dependence, cigarettes, uncomplicated: Secondary | ICD-10-CM | POA: Diagnosis not present

## 2023-08-11 DIAGNOSIS — M545 Low back pain, unspecified: Secondary | ICD-10-CM | POA: Diagnosis not present

## 2023-08-11 DIAGNOSIS — M25562 Pain in left knee: Secondary | ICD-10-CM | POA: Diagnosis not present

## 2023-08-11 DIAGNOSIS — M25561 Pain in right knee: Secondary | ICD-10-CM | POA: Diagnosis not present

## 2023-08-11 DIAGNOSIS — M129 Arthropathy, unspecified: Secondary | ICD-10-CM | POA: Diagnosis not present

## 2023-08-13 DIAGNOSIS — Z79899 Other long term (current) drug therapy: Secondary | ICD-10-CM | POA: Diagnosis not present

## 2023-08-31 ENCOUNTER — Telehealth: Payer: Self-pay | Admitting: Physician Assistant

## 2023-09-10 DIAGNOSIS — E669 Obesity, unspecified: Secondary | ICD-10-CM | POA: Diagnosis not present

## 2023-09-10 DIAGNOSIS — F17210 Nicotine dependence, cigarettes, uncomplicated: Secondary | ICD-10-CM | POA: Diagnosis not present

## 2023-09-10 DIAGNOSIS — I10 Essential (primary) hypertension: Secondary | ICD-10-CM | POA: Diagnosis not present

## 2023-09-10 DIAGNOSIS — M25562 Pain in left knee: Secondary | ICD-10-CM | POA: Diagnosis not present

## 2023-09-10 DIAGNOSIS — Z79899 Other long term (current) drug therapy: Secondary | ICD-10-CM | POA: Diagnosis not present

## 2023-09-10 DIAGNOSIS — M25571 Pain in right ankle and joints of right foot: Secondary | ICD-10-CM | POA: Diagnosis not present

## 2023-09-10 DIAGNOSIS — M25561 Pain in right knee: Secondary | ICD-10-CM | POA: Diagnosis not present

## 2023-09-10 DIAGNOSIS — M545 Low back pain, unspecified: Secondary | ICD-10-CM | POA: Diagnosis not present

## 2023-09-10 DIAGNOSIS — E559 Vitamin D deficiency, unspecified: Secondary | ICD-10-CM | POA: Diagnosis not present

## 2023-09-14 DIAGNOSIS — Z79899 Other long term (current) drug therapy: Secondary | ICD-10-CM | POA: Diagnosis not present

## 2023-10-06 ENCOUNTER — Encounter: Payer: Self-pay | Admitting: Internal Medicine

## 2023-10-11 DIAGNOSIS — Z0289 Encounter for other administrative examinations: Secondary | ICD-10-CM | POA: Diagnosis not present

## 2023-10-11 DIAGNOSIS — M25561 Pain in right knee: Secondary | ICD-10-CM | POA: Diagnosis not present

## 2023-10-11 DIAGNOSIS — F17210 Nicotine dependence, cigarettes, uncomplicated: Secondary | ICD-10-CM | POA: Diagnosis not present

## 2023-10-11 DIAGNOSIS — E559 Vitamin D deficiency, unspecified: Secondary | ICD-10-CM | POA: Diagnosis not present

## 2023-10-11 DIAGNOSIS — G8929 Other chronic pain: Secondary | ICD-10-CM | POA: Diagnosis not present

## 2023-10-11 DIAGNOSIS — M25562 Pain in left knee: Secondary | ICD-10-CM | POA: Diagnosis not present

## 2023-10-11 DIAGNOSIS — Z79899 Other long term (current) drug therapy: Secondary | ICD-10-CM | POA: Diagnosis not present

## 2023-10-11 DIAGNOSIS — M25571 Pain in right ankle and joints of right foot: Secondary | ICD-10-CM | POA: Diagnosis not present

## 2023-10-11 DIAGNOSIS — E669 Obesity, unspecified: Secondary | ICD-10-CM | POA: Diagnosis not present

## 2023-10-11 DIAGNOSIS — M545 Low back pain, unspecified: Secondary | ICD-10-CM | POA: Diagnosis not present

## 2023-10-11 DIAGNOSIS — I10 Essential (primary) hypertension: Secondary | ICD-10-CM | POA: Diagnosis not present

## 2023-10-12 DIAGNOSIS — H538 Other visual disturbances: Secondary | ICD-10-CM | POA: Diagnosis not present

## 2023-10-12 DIAGNOSIS — H52223 Regular astigmatism, bilateral: Secondary | ICD-10-CM | POA: Diagnosis not present

## 2023-10-12 DIAGNOSIS — H524 Presbyopia: Secondary | ICD-10-CM | POA: Diagnosis not present

## 2023-10-13 ENCOUNTER — Ambulatory Visit: Payer: BLUE CROSS/BLUE SHIELD | Admitting: Physician Assistant

## 2023-10-13 DIAGNOSIS — Z79899 Other long term (current) drug therapy: Secondary | ICD-10-CM | POA: Diagnosis not present

## 2023-10-21 DIAGNOSIS — H5213 Myopia, bilateral: Secondary | ICD-10-CM | POA: Diagnosis not present

## 2023-11-04 ENCOUNTER — Ambulatory Visit: Payer: Medicaid Other | Admitting: Physician Assistant

## 2023-11-04 DIAGNOSIS — M25562 Pain in left knee: Secondary | ICD-10-CM | POA: Diagnosis not present

## 2023-11-04 DIAGNOSIS — M25561 Pain in right knee: Secondary | ICD-10-CM | POA: Diagnosis not present

## 2023-11-04 DIAGNOSIS — M545 Low back pain, unspecified: Secondary | ICD-10-CM | POA: Diagnosis not present

## 2023-11-04 DIAGNOSIS — G8929 Other chronic pain: Secondary | ICD-10-CM | POA: Diagnosis not present

## 2023-11-04 DIAGNOSIS — F17210 Nicotine dependence, cigarettes, uncomplicated: Secondary | ICD-10-CM | POA: Diagnosis not present

## 2023-11-04 DIAGNOSIS — M25571 Pain in right ankle and joints of right foot: Secondary | ICD-10-CM | POA: Diagnosis not present

## 2023-11-04 DIAGNOSIS — I10 Essential (primary) hypertension: Secondary | ICD-10-CM | POA: Diagnosis not present

## 2023-11-04 DIAGNOSIS — Z79899 Other long term (current) drug therapy: Secondary | ICD-10-CM | POA: Diagnosis not present

## 2023-11-11 DIAGNOSIS — Z79899 Other long term (current) drug therapy: Secondary | ICD-10-CM | POA: Diagnosis not present

## 2023-11-11 NOTE — Progress Notes (Deleted)
Established patient visit  [name]   DOB: 01/16/1971   52 y.o. Female  MRN: [id number] Visit Date: 11/18/2023  Today's healthcare provider: Debera Lat, PA-C   No chief complaint on file.  Subjective       Discussed the use of AI scribe software for clinical note transcription with the patient, who gave verbal consent to proceed.  History of Present Illness               06/16/2023   10:36 AM 03/09/2023    2:28 PM 02/05/2023    1:10 PM  Depression screen PHQ 2/9  Decreased Interest 0 0 0  Down, Depressed, Hopeless 0 0 0  PHQ - 2 Score 0 0 0  Altered sleeping 1 1 1   Tired, decreased energy 0 1 1  Change in appetite 0 0 0  Feeling bad or failure about yourself  0 0 0  Trouble concentrating 0 0 0  Moving slowly or fidgety/restless 0 0 0  Suicidal thoughts 0 0 0  PHQ-9 Score 1 2 2   Difficult doing work/chores  Not difficult at all Not difficult at all      04/29/2021    5:46 PM  GAD 7 : Generalized Anxiety Score  Nervous, Anxious, on Edge   Control/stop worrying   Worry too much - different things   Trouble relaxing   Restless   Easily annoyed or irritable   Afraid - awful might happen   Total GAD 7 Score   Anxiety Difficulty      Information is confidential and restricted. Go to Review Flowsheets to unlock data.    Medications: Outpatient Medications Prior to Visit  Medication Sig  . atorvastatin (LIPITOR) 10 MG tablet Take 1 tablet (10 mg total) by mouth daily.  . famotidine (PEPCID) 40 MG tablet Take 40 mg by mouth daily. Take one tablet by mouth in the evening  . hydrALAZINE (APRESOLINE) 25 MG tablet TAKE 1 TABLET BY MOUTH 3 TIMES DAILY  . hydrochlorothiazide (HYDRODIURIL) 25 MG tablet TAKE 1 TABLET BY MOUTH ONCE DAILY  . losartan (COZAAR) 50 MG tablet TAKE 1 TABLET BY MOUTH TWICE DAILY  . methocarbamol (ROBAXIN) 500 MG tablet Take 1 tablet (500 mg total) by mouth 4 (four) times daily.  Marland Kitchen oxyCODONE-acetaminophen (PERCOCET/ROXICET) 5-325  MG tablet Take 1 tablet by mouth every 6 (six) hours as needed. Take one tablet 5x daily  . Vitamin D, Ergocalciferol, (DRISDOL) 1.25 MG (50000 UNIT) CAPS capsule Take 50,000 Units by mouth once a week.   No facility-administered medications prior to visit.    Review of Systems  All other systems reviewed and are negative. All negative Except see HPI   {Insert previous labs (optional):23779} {See past labs  Heme  Chem  Endocrine  Serology  Results Review (optional):1}   Objective    There were no vitals taken for this visit. {Insert last BP/Wt (optional):23777}{See vitals history (optional):1}   Physical Exam Vitals reviewed.  Constitutional:      General: She is not in acute distress.    Appearance: Normal appearance. She is well-developed. She is not diaphoretic.  HENT:     Head: Normocephalic and atraumatic.  Eyes:     General: No scleral icterus.    Conjunctiva/sclera: Conjunctivae normal.  Neck:     Thyroid: No thyromegaly.  Cardiovascular:     Rate and Rhythm: Normal rate and regular rhythm.     Pulses: Normal pulses.     Heart sounds: Normal heart  sounds. No murmur heard. Pulmonary:     Effort: Pulmonary effort is normal. No respiratory distress.     Breath sounds: Normal breath sounds. No wheezing, rhonchi or rales.  Musculoskeletal:     Cervical back: Neck supple.     Right lower leg: No edema.     Left lower leg: No edema.  Lymphadenopathy:     Cervical: No cervical adenopathy.  Skin:    General: Skin is warm and dry.     Findings: No rash.  Neurological:     Mental Status: She is alert and oriented to person, place, and time. Mental status is at baseline.  Psychiatric:        Mood and Affect: Mood normal.        Behavior: Behavior normal.     No results found for any visits on 11/18/23.      Assessment and Plan             No orders of the defined types were placed in this encounter.   No follow-ups on file.   The patient was  advised to call back or seek an in-person evaluation if the symptoms worsen or if the condition fails to improve as anticipated.  I discussed the assessment and treatment plan with the patient. The patient was provided an opportunity to ask questions and all were answered. The patient agreed with the plan and demonstrated an understanding of the instructions.  I, Debera Lat, PA-C have reviewed all documentation for this visit. The documentation on 11/18/2023  for the exam, diagnosis, procedures, and orders are all accurate and complete.  Debera Lat, [facility], MMS [facility] [phone] (phone) [phone] (fax)  [facility]

## 2023-11-12 ENCOUNTER — Telehealth: Payer: Self-pay | Admitting: Physician Assistant

## 2023-11-18 ENCOUNTER — Encounter: Payer: Self-pay | Admitting: Family Medicine

## 2023-11-18 ENCOUNTER — Ambulatory Visit: Payer: Medicaid Other | Admitting: Physician Assistant

## 2023-11-18 ENCOUNTER — Ambulatory Visit: Payer: Medicaid Other | Admitting: Family Medicine

## 2023-11-18 ENCOUNTER — Ambulatory Visit: Payer: Self-pay

## 2023-11-18 VITALS — BP 117/84 | HR 82 | Temp 98.6°F | Resp 18 | Ht 67.0 in | Wt 235.0 lb

## 2023-11-18 DIAGNOSIS — E669 Obesity, unspecified: Secondary | ICD-10-CM

## 2023-11-18 DIAGNOSIS — J4520 Mild intermittent asthma, uncomplicated: Secondary | ICD-10-CM

## 2023-11-18 DIAGNOSIS — I10 Essential (primary) hypertension: Secondary | ICD-10-CM | POA: Diagnosis not present

## 2023-11-18 DIAGNOSIS — E782 Mixed hyperlipidemia: Secondary | ICD-10-CM | POA: Diagnosis not present

## 2023-11-18 DIAGNOSIS — D751 Secondary polycythemia: Secondary | ICD-10-CM | POA: Diagnosis not present

## 2023-11-18 DIAGNOSIS — Z716 Tobacco abuse counseling: Secondary | ICD-10-CM | POA: Diagnosis not present

## 2023-11-18 DIAGNOSIS — F17200 Nicotine dependence, unspecified, uncomplicated: Secondary | ICD-10-CM

## 2023-11-18 MED ORDER — LOSARTAN POTASSIUM 50 MG PO TABS: ORAL_TABLET | ORAL | 1 refills | Status: DC

## 2023-11-18 MED ORDER — HYDROCHLOROTHIAZIDE 25 MG PO TABS: 25.0000 mg | ORAL_TABLET | Freq: Every day | ORAL | 1 refills | Status: DC

## 2023-11-18 MED ORDER — HYDRALAZINE HCL 25 MG PO TABS: 25.0000 mg | ORAL_TABLET | Freq: Three times a day (TID) | ORAL | 1 refills | Status: DC

## 2023-11-18 MED ORDER — ATORVASTATIN CALCIUM 10 MG PO TABS: 10.0000 mg | ORAL_TABLET | Freq: Every day | ORAL | 3 refills | Status: DC

## 2023-11-18 MED ORDER — ALBUTEROL SULFATE HFA 108 (90 BASE) MCG/ACT IN AERS: 2.0000 | INHALATION_SPRAY | Freq: Four times a day (QID) | RESPIRATORY_TRACT | 2 refills | Status: DC | PRN

## 2023-11-18 NOTE — Progress Notes (Signed)
 Established Patient Office Visit  Introduced to nurse practitioner role and practice setting.  All questions answered.  Discussed provider/patient relationship and expectations.   Subjective   Patient ID: Michelle Simpson, female    DOB: Jan 27, 1971  Age: 53 y.o. MRN: [id number]  Chief Complaint  Patient presents with   Hypertension    Patient was last seen in July.  Management changes at that time weere none.  Patien tdoes not check her blood pressure out of the office but states she goes to pain management monthly and they check it there.  She states it has been good.   Hyperlipidemia    Pt presents for HTN and HLD medication mgmt. Denies headaches, but does have knew eye prescription. No acute eye changes. Does not monitor at home, but states at other healthcare visits her pressure is <130/80.   Hypertension: Patient here for follow-up of elevated blood pressure. She is note exercising and intermittently adherent to low salt diet.  Blood pressure is not monitored at home, but today well controlled at office. Cardiac symptoms none. Patient denies chest pain, chest pressure/discomfort, claudication, dyspnea, exertional chest pressure/discomfort, irregular heart beat, lower extremity edema, near-syncope, palpitations, syncope, and tachypnea.  Cardiovascular risk factors: diabetes mellitus, dyslipidemia, hypertension, obesity (BMI >= 30 kg/m2), sedentary lifestyle, and smoking/ tobacco exposure. Use of agents associated with hypertension: none. History of target organ damage: none    Sees pain mgmt and orthopedic for knees.   Needs Routine labs.   Hypertension This is a chronic problem. The current episode started more than 1 year ago. The problem has been gradually improving since onset. The problem is controlled. There are no associated agents to hypertension. Risk factors for coronary artery disease include diabetes mellitus, dyslipidemia, obesity, smoking/tobacco exposure and stress. Past  treatments include angiotensin blockers, diuretics and direct vasodilators. The current treatment provides significant improvement. There are no compliance problems.         11/18/2023    3:49 PM 06/16/2023   10:36 AM 03/09/2023    2:28 PM  Depression screen PHQ 2/9  Decreased Interest 0 0 0  Down, Depressed, Hopeless 0 0 0  PHQ - 2 Score 0 0 0  Altered sleeping 1 1 1   Tired, decreased energy 1 0 1  Change in appetite 0 0 0  Feeling bad or failure about yourself  0 0 0  Trouble concentrating 0 0 0  Moving slowly or fidgety/restless 0 0 0  Suicidal thoughts 0 0 0  PHQ-9 Score 2 1 2   Difficult doing work/chores Not difficult at all  Not difficult at all       11/18/2023    3:50 PM 04/29/2021    5:46 PM  GAD 7 : Generalized Anxiety Score  Nervous, Anxious, on Edge 0   Control/stop worrying 0   Worry too much - different things 0   Trouble relaxing 0   Restless 0   Easily annoyed or irritable 0   Afraid - awful might happen 0   Total GAD 7 Score 0   Anxiety Difficulty Not difficult at all      Information is confidential and restricted. Go to Review Flowsheets to unlock data.     Review of Systems  All other systems reviewed and are negative.   Negative unless indicated in HPI   Objective:     BP 117/84 (BP Location: Left Arm, Patient Position: Sitting, Cuff Size: Large)   Pulse 82   Temp 98.6 F (37 C) (  Oral)   Resp 18   Ht 5' 7 (1.702 m)   Wt 235 lb (106.6 kg)   BMI 36.81 kg/m    Physical Exam Constitutional:      Appearance: Normal appearance. She is obese.  HENT:     Head: Normocephalic.     Mouth/Throat:     Mouth: Mucous membranes are dry.  Cardiovascular:     Rate and Rhythm: Normal rate and regular rhythm.     Pulses: Normal pulses.     Heart sounds: Normal heart sounds. No murmur heard.    No friction rub. No gallop.  Pulmonary:     Effort: Pulmonary effort is normal. No respiratory distress.     Breath sounds: Normal breath sounds. No  stridor. No wheezing, rhonchi or rales.  Chest:     Chest wall: No tenderness.  Musculoskeletal:     Cervical back: No tenderness.     Comments: L knee brace present  Lymphadenopathy:     Cervical: No cervical adenopathy.  Skin:    General: Skin is warm and dry.     Capillary Refill: Capillary refill takes less than 2 seconds.  Neurological:     General: No focal deficit present.     Mental Status: She is alert and oriented to person, place, and time. Mental status is at baseline.  Psychiatric:        Mood and Affect: Mood normal.        Behavior: Behavior normal.        Thought Content: Thought content normal.        Judgment: Judgment normal.     No results found for any visits on 11/18/23.    The 10-year ASCVD risk score (Arnett DK, et al., 2019) is: 8%    Assessment & Plan:  Primary hypertension Assessment & Plan: HTN controlled, chronic, well managed today, 117/84 Continue taking losartan  50 Mg twice daily, hydrochlorothiazide  25 Mg daily, and Hydralazine  25 mg 3 times daily.  Recommend measuring blood pressure at home with upper arm cuff - automatic Continue low-sodium diet and regular exercise as tolerated Recommend f/u in 6 months for BP check and lab check  Orders: -     Comprehensive metabolic panel -     hydrALAZINE  HCl; Take 1 tablet (25 mg total) by mouth 3 (three) times daily.  Dispense: 270 tablet; Refill: 1 -     hydroCHLOROthiazide ; Take 1 tablet (25 mg total) by mouth daily.  Dispense: 90 tablet; Refill: 1 -     Losartan  Potassium; TAKE 1 TABLET BY MOUTH TWICE DAILY  Dispense: 180 tablet; Refill: 1  Mixed hyperlipidemia Assessment & Plan: Chronic, historically uncontrolled.  Will check lipid panel today, pt not fasting, but unable to come back for labs given schedule.  LDL goal<70 - Recommend low fat, limit saturated fats. Increase protein, fish, nuts, yogurts.   Orders: -     Lipid Panel With LDL/HDL Ratio -     Atorvastatin  Calcium ; Take 1  tablet (10 mg total) by mouth daily.  Dispense: 90 tablet; Refill: 3  Mild intermittent asthma without complication Assessment & Plan: Intermittent asthma needs due to smoking and environment Needs new albuterol  inhaler, using expired inhaler No wheezing ore respiratory distress today.  Reordered Albuterol  as needed  Orders: -     Albuterol  Sulfate HFA; Inhale 2 puffs into the lungs every 6 (six) hours as needed for wheezing or shortness of breath.  Dispense: 8 g; Refill: 2  Polycythemia Assessment & Plan:  Hx of polycythemia - hx of need for phlebotomies and DVT Will recheck CBC  Orders: -     CBC with Differential/Platelet  Tobacco abuse counseling Assessment & Plan: Smokes 1 pack per day Discussed lower to half pack.  Pt states will work on it.  Declines further medication or therapy at this time.     Return in about 6 months (around 05/17/2024) for chronic disease mgmt, BP check.   I, Curtis DELENA Boom, FNP, have reviewed all documentation for this visit. The documentation on 11/18/23 for the exam, diagnosis, procedures, and orders are all accurate and complete.   Curtis DELENA Boom, FNP

## 2023-11-18 NOTE — Assessment & Plan Note (Signed)
 Chronic, historically uncontrolled.  Will check lipid panel today, pt not fasting, but unable to come back for labs given schedule.  LDL goal<70 - Recommend low fat, limit saturated fats. Increase protein, fish, nuts, yogurts.

## 2023-11-18 NOTE — Assessment & Plan Note (Signed)
 Intermittent asthma needs due to smoking and environment Needs new albuterol inhaler, using expired inhaler No wheezing ore respiratory distress today.  Reordered Albuterol as needed

## 2023-11-18 NOTE — Assessment & Plan Note (Signed)
 Hx of polycythemia - hx of need for phlebotomies and DVT Will recheck CBC

## 2023-11-18 NOTE — Telephone Encounter (Signed)
 Routing to PCP d/t unable to change rx. Please advise pharmacy with call back.   Summary: albuterol  (VENTOLIN  HFA) 108 (90 Base) MCG/ACT inhaler 8 g?   Mandy with [facility] called in with questions about the medication prescribed, albuterol  (VENTOLIN  HFA) 108 (90 Base) MCG/ACT inhaler 8 g. She states the insurance requires it to say 18g not 8g. Please assist further       Reason for Disposition  [1] Pharmacy calling with prescription question AND [2] triager unable to answer question  Answer Assessment - Initial Assessment Questions 1. NAME of MEDICINE: What medicine(s) are you calling about?     Albuterol   2. QUESTION: What is your question? (e.g., double dose of medicine, side effect)     Insurance will cover 18g not 8g which was prescribed today 3. PRESCRIBER: Who prescribed the medicine? Reason: if prescribed by specialist, call should be referred to that group.     Curtis, NP  Protocols used: Medication Question Call-A-AH

## 2023-11-18 NOTE — Assessment & Plan Note (Addendum)
 HTN controlled, chronic, well managed today, 117/84 Continue taking losartan  50 Mg twice daily, hydrochlorothiazide  25 Mg daily, and Hydralazine  25 mg 3 times daily.  Recommend measuring blood pressure at home with upper arm cuff - automatic Continue low-sodium diet and regular exercise as tolerated Recommend f/u in 6 months for BP check and lab check

## 2023-11-18 NOTE — Assessment & Plan Note (Signed)
 Smokes 1 pack per day Discussed lower to half pack.  Pt states will work on it.  Declines further medication or therapy at this time.

## 2023-11-19 ENCOUNTER — Other Ambulatory Visit: Payer: Self-pay

## 2023-11-19 DIAGNOSIS — J4520 Mild intermittent asthma, uncomplicated: Secondary | ICD-10-CM

## 2023-11-19 LAB — LIPID PANEL WITH LDL/HDL RATIO
Cholesterol, Total: 188 mg/dL (ref 100–199)
HDL: 35 mg/dL — ABNORMAL LOW (ref >39)
LDL Chol Calc (NIH): 86 mg/dL (ref 0–99)
LDL/HDL Ratio: 2.5 {ratio} (ref 0.0–3.2)
Triglycerides: 408 mg/dL — ABNORMAL HIGH (ref 0–149)
VLDL Cholesterol Cal: 67 mg/dL — ABNORMAL HIGH (ref 5–40)

## 2023-11-19 LAB — COMPREHENSIVE METABOLIC PANEL
ALT: 14 [IU]/L (ref 0–32)
AST: 15 [IU]/L (ref 0–40)
Albumin: 4.3 g/dL (ref 3.8–4.9)
Alkaline Phosphatase: 117 [IU]/L (ref 44–121)
BUN/Creatinine Ratio: 15 (ref 9–23)
BUN: 11 mg/dL (ref 6–24)
Bilirubin Total: 0.3 mg/dL (ref 0.0–1.2)
CO2: 26 mmol/L (ref 20–29)
Calcium: 10.8 mg/dL — ABNORMAL HIGH (ref 8.7–10.2)
Chloride: 99 mmol/L (ref 96–106)
Creatinine, Ser: 0.73 mg/dL (ref 0.57–1.00)
Globulin, Total: 2.4 g/dL (ref 1.5–4.5)
Glucose: 100 mg/dL — ABNORMAL HIGH (ref 70–99)
Potassium: 3.9 mmol/L (ref 3.5–5.2)
Sodium: 139 mmol/L (ref 134–144)
Total Protein: 6.7 g/dL (ref 6.0–8.5)
eGFR: 99 mL/min/{1.73_m2} (ref >59)

## 2023-11-19 LAB — CBC WITH DIFFERENTIAL/PLATELET
Basophils Absolute: 0.1 10*3/uL (ref 0.0–0.2)
Basos: 1 %
EOS (ABSOLUTE): 0.3 10*3/uL (ref 0.0–0.4)
Eos: 3 %
Hematocrit: 46.6 % (ref 34.0–46.6)
Hemoglobin: 16 g/dL — ABNORMAL HIGH (ref 11.1–15.9)
Immature Grans (Abs): 0 10*3/uL (ref 0.0–0.1)
Immature Granulocytes: 0 %
Lymphocytes Absolute: 3.4 10*3/uL — ABNORMAL HIGH (ref 0.7–3.1)
Lymphs: 35 %
MCH: 32.7 pg (ref 26.6–33.0)
MCHC: 34.3 g/dL (ref 31.5–35.7)
MCV: 95 fL (ref 79–97)
Monocytes Absolute: 0.8 10*3/uL (ref 0.1–0.9)
Monocytes: 8 %
Neutrophils Absolute: 5.2 10*3/uL (ref 1.4–7.0)
Neutrophils: 53 %
Platelets: 371 10*3/uL (ref 150–450)
RBC: 4.9 x10E6/uL (ref 3.77–5.28)
RDW: 13.1 % (ref 11.7–15.4)
WBC: 9.7 10*3/uL (ref 3.4–10.8)

## 2023-11-19 MED ORDER — ALBUTEROL SULFATE HFA 108 (90 BASE) MCG/ACT IN AERS: 2.0000 | INHALATION_SPRAY | Freq: Four times a day (QID) | RESPIRATORY_TRACT | 2 refills | Status: AC | PRN

## 2023-12-06 DIAGNOSIS — M545 Low back pain, unspecified: Secondary | ICD-10-CM | POA: Diagnosis not present

## 2023-12-06 DIAGNOSIS — G8929 Other chronic pain: Secondary | ICD-10-CM | POA: Diagnosis not present

## 2023-12-06 DIAGNOSIS — M25571 Pain in right ankle and joints of right foot: Secondary | ICD-10-CM | POA: Diagnosis not present

## 2023-12-06 DIAGNOSIS — F17210 Nicotine dependence, cigarettes, uncomplicated: Secondary | ICD-10-CM | POA: Diagnosis not present

## 2023-12-06 DIAGNOSIS — I10 Essential (primary) hypertension: Secondary | ICD-10-CM | POA: Diagnosis not present

## 2023-12-06 DIAGNOSIS — E559 Vitamin D deficiency, unspecified: Secondary | ICD-10-CM | POA: Diagnosis not present

## 2023-12-06 DIAGNOSIS — E669 Obesity, unspecified: Secondary | ICD-10-CM | POA: Diagnosis not present

## 2023-12-06 DIAGNOSIS — Z79899 Other long term (current) drug therapy: Secondary | ICD-10-CM | POA: Diagnosis not present

## 2023-12-06 DIAGNOSIS — M25562 Pain in left knee: Secondary | ICD-10-CM | POA: Diagnosis not present

## 2023-12-06 DIAGNOSIS — M25561 Pain in right knee: Secondary | ICD-10-CM | POA: Diagnosis not present

## 2023-12-09 DIAGNOSIS — Z79899 Other long term (current) drug therapy: Secondary | ICD-10-CM | POA: Diagnosis not present

## 2024-01-05 DIAGNOSIS — M25562 Pain in left knee: Secondary | ICD-10-CM | POA: Diagnosis not present

## 2024-01-05 DIAGNOSIS — G8929 Other chronic pain: Secondary | ICD-10-CM | POA: Diagnosis not present

## 2024-01-05 DIAGNOSIS — M25561 Pain in right knee: Secondary | ICD-10-CM | POA: Diagnosis not present

## 2024-01-05 DIAGNOSIS — E559 Vitamin D deficiency, unspecified: Secondary | ICD-10-CM | POA: Diagnosis not present

## 2024-01-05 DIAGNOSIS — E669 Obesity, unspecified: Secondary | ICD-10-CM | POA: Diagnosis not present

## 2024-01-05 DIAGNOSIS — M25571 Pain in right ankle and joints of right foot: Secondary | ICD-10-CM | POA: Diagnosis not present

## 2024-01-05 DIAGNOSIS — Z79899 Other long term (current) drug therapy: Secondary | ICD-10-CM | POA: Diagnosis not present

## 2024-01-05 DIAGNOSIS — F17210 Nicotine dependence, cigarettes, uncomplicated: Secondary | ICD-10-CM | POA: Diagnosis not present

## 2024-01-05 DIAGNOSIS — I10 Essential (primary) hypertension: Secondary | ICD-10-CM | POA: Diagnosis not present

## 2024-01-05 DIAGNOSIS — M545 Low back pain, unspecified: Secondary | ICD-10-CM | POA: Diagnosis not present

## 2024-01-07 DIAGNOSIS — Z79899 Other long term (current) drug therapy: Secondary | ICD-10-CM | POA: Diagnosis not present

## 2024-01-10 DIAGNOSIS — H52223 Regular astigmatism, bilateral: Secondary | ICD-10-CM | POA: Diagnosis not present

## 2024-02-01 DIAGNOSIS — I10 Essential (primary) hypertension: Secondary | ICD-10-CM | POA: Diagnosis not present

## 2024-02-01 DIAGNOSIS — M25551 Pain in right hip: Secondary | ICD-10-CM | POA: Diagnosis not present

## 2024-02-01 DIAGNOSIS — Z79899 Other long term (current) drug therapy: Secondary | ICD-10-CM | POA: Diagnosis not present

## 2024-02-01 DIAGNOSIS — G8929 Other chronic pain: Secondary | ICD-10-CM | POA: Diagnosis not present

## 2024-02-01 DIAGNOSIS — E669 Obesity, unspecified: Secondary | ICD-10-CM | POA: Diagnosis not present

## 2024-02-01 DIAGNOSIS — F17210 Nicotine dependence, cigarettes, uncomplicated: Secondary | ICD-10-CM | POA: Diagnosis not present

## 2024-02-01 DIAGNOSIS — M25562 Pain in left knee: Secondary | ICD-10-CM | POA: Diagnosis not present

## 2024-02-01 DIAGNOSIS — M549 Dorsalgia, unspecified: Secondary | ICD-10-CM | POA: Diagnosis not present

## 2024-02-01 DIAGNOSIS — M129 Arthropathy, unspecified: Secondary | ICD-10-CM | POA: Diagnosis not present

## 2024-02-01 DIAGNOSIS — M25561 Pain in right knee: Secondary | ICD-10-CM | POA: Diagnosis not present

## 2024-02-01 DIAGNOSIS — E559 Vitamin D deficiency, unspecified: Secondary | ICD-10-CM | POA: Diagnosis not present

## 2024-02-01 DIAGNOSIS — M25571 Pain in right ankle and joints of right foot: Secondary | ICD-10-CM | POA: Diagnosis not present

## 2024-02-04 DIAGNOSIS — Z79899 Other long term (current) drug therapy: Secondary | ICD-10-CM | POA: Diagnosis not present

## 2024-02-29 DIAGNOSIS — Z79899 Other long term (current) drug therapy: Secondary | ICD-10-CM | POA: Diagnosis not present

## 2024-02-29 DIAGNOSIS — M549 Dorsalgia, unspecified: Secondary | ICD-10-CM | POA: Diagnosis not present

## 2024-02-29 DIAGNOSIS — M25561 Pain in right knee: Secondary | ICD-10-CM | POA: Diagnosis not present

## 2024-02-29 DIAGNOSIS — E669 Obesity, unspecified: Secondary | ICD-10-CM | POA: Diagnosis not present

## 2024-02-29 DIAGNOSIS — F17210 Nicotine dependence, cigarettes, uncomplicated: Secondary | ICD-10-CM | POA: Diagnosis not present

## 2024-02-29 DIAGNOSIS — M25551 Pain in right hip: Secondary | ICD-10-CM | POA: Diagnosis not present

## 2024-02-29 DIAGNOSIS — M25571 Pain in right ankle and joints of right foot: Secondary | ICD-10-CM | POA: Diagnosis not present

## 2024-02-29 DIAGNOSIS — M25562 Pain in left knee: Secondary | ICD-10-CM | POA: Diagnosis not present

## 2024-02-29 DIAGNOSIS — I10 Essential (primary) hypertension: Secondary | ICD-10-CM | POA: Diagnosis not present

## 2024-03-07 DIAGNOSIS — Z79899 Other long term (current) drug therapy: Secondary | ICD-10-CM | POA: Diagnosis not present

## 2024-03-31 DIAGNOSIS — M549 Dorsalgia, unspecified: Secondary | ICD-10-CM | POA: Diagnosis not present

## 2024-03-31 DIAGNOSIS — M25571 Pain in right ankle and joints of right foot: Secondary | ICD-10-CM | POA: Diagnosis not present

## 2024-03-31 DIAGNOSIS — M25551 Pain in right hip: Secondary | ICD-10-CM | POA: Diagnosis not present

## 2024-03-31 DIAGNOSIS — Z79899 Other long term (current) drug therapy: Secondary | ICD-10-CM | POA: Diagnosis not present

## 2024-03-31 DIAGNOSIS — E559 Vitamin D deficiency, unspecified: Secondary | ICD-10-CM | POA: Diagnosis not present

## 2024-03-31 DIAGNOSIS — I10 Essential (primary) hypertension: Secondary | ICD-10-CM | POA: Diagnosis not present

## 2024-03-31 DIAGNOSIS — G8929 Other chronic pain: Secondary | ICD-10-CM | POA: Diagnosis not present

## 2024-03-31 DIAGNOSIS — M25561 Pain in right knee: Secondary | ICD-10-CM | POA: Diagnosis not present

## 2024-03-31 DIAGNOSIS — E669 Obesity, unspecified: Secondary | ICD-10-CM | POA: Diagnosis not present

## 2024-03-31 DIAGNOSIS — M25562 Pain in left knee: Secondary | ICD-10-CM | POA: Diagnosis not present

## 2024-03-31 DIAGNOSIS — F17210 Nicotine dependence, cigarettes, uncomplicated: Secondary | ICD-10-CM | POA: Diagnosis not present

## 2024-04-04 DIAGNOSIS — Z79899 Other long term (current) drug therapy: Secondary | ICD-10-CM | POA: Diagnosis not present

## 2024-04-28 DIAGNOSIS — M25561 Pain in right knee: Secondary | ICD-10-CM | POA: Diagnosis not present

## 2024-04-28 DIAGNOSIS — M25571 Pain in right ankle and joints of right foot: Secondary | ICD-10-CM | POA: Diagnosis not present

## 2024-04-28 DIAGNOSIS — E669 Obesity, unspecified: Secondary | ICD-10-CM | POA: Diagnosis not present

## 2024-04-28 DIAGNOSIS — Z79899 Other long term (current) drug therapy: Secondary | ICD-10-CM | POA: Diagnosis not present

## 2024-04-28 DIAGNOSIS — E559 Vitamin D deficiency, unspecified: Secondary | ICD-10-CM | POA: Diagnosis not present

## 2024-04-28 DIAGNOSIS — I10 Essential (primary) hypertension: Secondary | ICD-10-CM | POA: Diagnosis not present

## 2024-04-28 DIAGNOSIS — F17210 Nicotine dependence, cigarettes, uncomplicated: Secondary | ICD-10-CM | POA: Diagnosis not present

## 2024-04-28 DIAGNOSIS — M549 Dorsalgia, unspecified: Secondary | ICD-10-CM | POA: Diagnosis not present

## 2024-04-28 DIAGNOSIS — M25562 Pain in left knee: Secondary | ICD-10-CM | POA: Diagnosis not present

## 2024-04-28 DIAGNOSIS — M25551 Pain in right hip: Secondary | ICD-10-CM | POA: Diagnosis not present

## 2024-05-02 DIAGNOSIS — Z79899 Other long term (current) drug therapy: Secondary | ICD-10-CM | POA: Diagnosis not present

## 2024-05-17 ENCOUNTER — Ambulatory Visit: Payer: Self-pay | Admitting: Physician Assistant

## 2024-05-17 VITALS — BP 137/86 | HR 69 | Resp 16 | Ht 67.0 in | Wt 241.0 lb

## 2024-05-17 DIAGNOSIS — I10 Essential (primary) hypertension: Secondary | ICD-10-CM

## 2024-05-17 DIAGNOSIS — E669 Obesity, unspecified: Secondary | ICD-10-CM

## 2024-05-17 DIAGNOSIS — J4520 Mild intermittent asthma, uncomplicated: Secondary | ICD-10-CM

## 2024-05-17 DIAGNOSIS — E782 Mixed hyperlipidemia: Secondary | ICD-10-CM

## 2024-05-17 DIAGNOSIS — D751 Secondary polycythemia: Secondary | ICD-10-CM | POA: Diagnosis not present

## 2024-05-17 DIAGNOSIS — G894 Chronic pain syndrome: Secondary | ICD-10-CM | POA: Diagnosis not present

## 2024-05-17 DIAGNOSIS — Z716 Tobacco abuse counseling: Secondary | ICD-10-CM

## 2024-05-17 MED ORDER — ATORVASTATIN CALCIUM 10 MG PO TABS
10.0000 mg | ORAL_TABLET | Freq: Every day | ORAL | 3 refills | Status: AC
Start: 2024-05-17 — End: ?

## 2024-05-17 MED ORDER — HYDROCHLOROTHIAZIDE 25 MG PO TABS: 25.0000 mg | ORAL_TABLET | Freq: Every day | ORAL | 1 refills | Status: AC

## 2024-05-17 MED ORDER — HYDRALAZINE HCL 25 MG PO TABS: 25.0000 mg | ORAL_TABLET | Freq: Three times a day (TID) | ORAL | 1 refills | Status: AC

## 2024-05-17 MED ORDER — LOSARTAN POTASSIUM 50 MG PO TABS: ORAL_TABLET | ORAL | 1 refills | Status: AC

## 2024-05-17 MED ORDER — SEMAGLUTIDE-WEIGHT MANAGEMENT 0.25 MG/0.5ML ~~LOC~~ SOAJ: 0.2500 mg | SUBCUTANEOUS | 0 refills | Status: AC

## 2024-05-17 NOTE — Progress Notes (Unsigned)
 Established patient visit  [name]   DOB: 11-19-1970   53 y.o. Female  MRN: [id number] Visit Date: 05/17/2024  Today's healthcare provider: Jolynn Spencer, PA-C   Chief Complaint  Patient presents with   Follow-up    6 month f/u  no other  concerns   Subjective     HPI     Follow-up    Additional comments: 6 month f/u  no other  concerns      Last edited by Marylen Odella CROME, CMA on 05/17/2024  3:50 PM.       Discussed the use of AI scribe software for clinical note transcription with the patient, who gave verbal consent to proceed.  History of Present Illness Michelle Simpson is a 53 year old female with hypertension and hyperlipidemia who presents for a follow-up visit.  She has a history of elevated hemoglobin, with the last level at 16 in January. A repeat CBC is needed to monitor for any changes, as phlebotomy is required if it reaches 17. Her blood sugar and calcium  levels have improved since the last visit. Lipid levels have also improved, and she is currently on Lipitor for hyperlipidemia.  She takes hydralazine  25 mcg, losartan  50 mcg, and hydrochlorothiazide  25 mcg for hypertension. She usually takes two hydralazine  tablets but increases to three on days with high blood pressure. She has lost 8.5 pounds last month by cutting out soda but has only lost an additional half-pound in the last three weeks, expressing concern about a weight plateau.  She experiences acid reflux, attributed to oxycodone  use, and takes Pepcid as prescribed by her pain management clinic. She has difficulty with physical activity due to pain, limiting her ability to stand or walk for extended periods. A recent incident of cleaning her refrigerator resulted in significant pain the following day.  She reports a slight change in vision from her prescription three years ago, occasional leg swelling, and increased pain with rain. She denies chest pain, shortness of breath, or rapid heart  beating. She has custody of her autistic nephew, which impacts her ability to schedule surgery for arthritis.       05/17/2024    4:35 PM 11/18/2023    3:49 PM 06/16/2023   10:36 AM  Depression screen PHQ 2/9  Decreased Interest 0 0 0  Down, Depressed, Hopeless 0 0 0  PHQ - 2 Score 0 0 0  Altered sleeping 2 1 1   Tired, decreased energy 0 1 0  Change in appetite 0 0 0  Feeling bad or failure about yourself  0 0 0  Trouble concentrating 0 0 0  Moving slowly or fidgety/restless 0 0 0  Suicidal thoughts 0 0 0  PHQ-9 Score 2 2 1   Difficult doing work/chores Somewhat difficult Not difficult at all       05/17/2024    4:35 PM 11/18/2023    3:50 PM 04/29/2021    5:46 PM  GAD 7 : Generalized Anxiety Score  Nervous, Anxious, on Edge 0 0   Control/stop worrying 0 0   Worry too much - different things 0 0   Trouble relaxing 0 0   Restless 0 0   Easily annoyed or irritable 0 0   Afraid - awful might happen 0 0   Total GAD 7 Score 0 0   Anxiety Difficulty Not difficult at all Not difficult at all      Information is confidential and restricted. Go to Review Flowsheets to unlock  data.    Medications: Outpatient Medications Prior to Visit  Medication Sig   albuterol  (VENTOLIN  HFA) 108 (90 Base) MCG/ACT inhaler Inhale 2 puffs into the lungs every 6 (six) hours as needed for wheezing or shortness of breath.   famotidine (PEPCID) 40 MG tablet Take 40 mg by mouth daily. Take one tablet by mouth in the evening   methocarbamol  (ROBAXIN ) 500 MG tablet Take 1 tablet (500 mg total) by mouth 4 (four) times daily.   oxyCODONE -acetaminophen  (PERCOCET/ROXICET) 5-325 MG tablet Take 1 tablet by mouth every 6 (six) hours as needed. Take one tablet 5x daily   Vitamin D, Ergocalciferol, (DRISDOL) 1.25 MG (50000 UNIT) CAPS capsule Take 50,000 Units by mouth once a week.   [DISCONTINUED] atorvastatin  (LIPITOR) 10 MG tablet Take 1 tablet (10 mg total) by mouth daily.   [DISCONTINUED] hydrALAZINE  (APRESOLINE ) 25  MG tablet Take 1 tablet (25 mg total) by mouth 3 (three) times daily.   [DISCONTINUED] hydrochlorothiazide  (HYDRODIURIL ) 25 MG tablet Take 1 tablet (25 mg total) by mouth daily.   [DISCONTINUED] losartan  (COZAAR ) 50 MG tablet TAKE 1 TABLET BY MOUTH TWICE DAILY   No facility-administered medications prior to visit.    Review of Systems All negative Except see HPI   {Insert previous labs (optional):23779} {See past labs  Heme  Chem  Endocrine  Serology  Results Review (optional):1}   Objective    BP 137/86 (BP Location: Right Arm, Patient Position: Sitting, Cuff Size: Large)   Pulse 69   Resp 16   Ht 5' 7 (1.702 m)   Wt 241 lb (109.3 kg)   SpO2 100%   BMI 37.75 kg/m  {Insert last BP/Wt (optional):23777}{See vitals history (optional):1}   Physical Exam Vitals reviewed.  Constitutional:      General: She is not in acute distress.    Appearance: Normal appearance. She is well-developed. She is not diaphoretic.  HENT:     Head: Normocephalic and atraumatic.  Eyes:     General: No scleral icterus.    Conjunctiva/sclera: Conjunctivae normal.  Neck:     Thyroid: No thyromegaly.  Cardiovascular:     Rate and Rhythm: Normal rate and regular rhythm.     Pulses: Normal pulses.     Heart sounds: Normal heart sounds. No murmur heard. Pulmonary:     Effort: Pulmonary effort is normal. No respiratory distress.     Breath sounds: Normal breath sounds. No wheezing, rhonchi or rales.  Musculoskeletal:     Cervical back: Neck supple.     Right lower leg: No edema.     Left lower leg: No edema.  Lymphadenopathy:     Cervical: No cervical adenopathy.  Skin:    General: Skin is warm and dry.     Findings: No rash.  Neurological:     Mental Status: She is alert and oriented to person, place, and time. Mental status is at baseline.  Psychiatric:        Mood and Affect: Mood normal.        Behavior: Behavior normal.      No results found for any visits on 05/17/24.       Assessment and Plan Assessment & Plan Hypertension Blood pressure management ongoing with hydralazine , losartan , and hydrochlorothiazide . Current levels concerning; adjustments may be needed. - Monitor blood pressure regularly. - Adjust antihypertensive medication if necessary.  Chronic Pain Management Chronic pain managed with oxycodone  and Pepcid. Regular drug testing and monitoring at pain clinic. Limited physical activity impacts weight loss. -  Continue current pain management regimen. - Maintain regular follow-ups with pain management clinic.  Obesity Weight loss efforts include dietary changes and limited physical activity. Insurance coverage for [employer] uncertain. Weight loss expected to alleviate joint pain. Engineer, maintenance (IT) insurance to check coverage for [facility]. - Continue dietary modifications and exercise as tolerated. - Consider referral to a nutrition program if insurance requires.  Hyperlipidemia Lipids elevated but improving. Continued monitoring necessary. - Refill Lipitor prescription.  Elevated Hemoglobin Hemoglobin improved to 16, acceptable. No intervention unless it reaches 17. - Repeat CBC to monitor hemoglobin levels.    Orders Placed This Encounter  Procedures   CBC with Differential/Platelet   Comprehensive metabolic panel with GFR    Has the patient fasted?:   Yes   Lipid panel    Has the patient fasted?:   Yes    No follow-ups on file.   The patient was advised to call back or seek an in-person evaluation if the symptoms worsen or if the condition fails to improve as anticipated.  I discussed the assessment and treatment plan with the patient. The patient was provided an opportunity to ask questions and all were answered. The patient agreed with the plan and demonstrated an understanding of the instructions.  I, Trysten Berti, PA-C have reviewed all documentation for this visit. The documentation on 05/17/2024  for the exam, diagnosis, procedures, and  orders are all accurate and complete.  Jolynn Spencer, [facility], MMS [facility] [phone] (phone) [phone] (fax)  [facility]

## 2024-05-18 ENCOUNTER — Encounter: Payer: Self-pay | Admitting: Internal Medicine

## 2024-05-18 ENCOUNTER — Other Ambulatory Visit (HOSPITAL_COMMUNITY): Payer: Self-pay

## 2024-05-18 ENCOUNTER — Telehealth: Payer: Self-pay | Admitting: Pharmacy Technician

## 2024-05-18 NOTE — Telephone Encounter (Signed)
 Pharmacy Patient Advocate Encounter   Received notification from CoverMyMeds that prior authorization for [facility] 0.25MG /0.5ML auto-injectors is required/requested.   Insurance verification completed.   The patient is insured through [facility] .   Per test claim: PA required; PA submitted to above mentioned insurance via CoverMyMeds Key/confirmation #/EOC [id number] Status is pending

## 2024-05-22 ENCOUNTER — Other Ambulatory Visit (HOSPITAL_COMMUNITY): Payer: Self-pay

## 2024-05-22 NOTE — Telephone Encounter (Signed)
 Pharmacy Patient Advocate Encounter  Received notification from [facility] that Prior Authorization for  Wegovy  0.25MG /0.5ML auto-injectors  has been APPROVED from 05/18/2024 to 11/18/2024. Ran test claim, Copay is $4.00. This test claim was processed through [facility]- copay amounts may vary at other pharmacies due to pharmacy/plan contracts, or as the patient moves through the different stages of their insurance plan. Wegovy  0.25MG /0.5ML auto-injectors   PA #/Case ID/Reference #: [id number]

## 2024-05-23 ENCOUNTER — Encounter: Payer: Self-pay | Admitting: Physician Assistant

## 2024-05-23 DIAGNOSIS — E669 Obesity, unspecified: Secondary | ICD-10-CM

## 2024-05-26 DIAGNOSIS — E559 Vitamin D deficiency, unspecified: Secondary | ICD-10-CM | POA: Diagnosis not present

## 2024-05-26 DIAGNOSIS — M549 Dorsalgia, unspecified: Secondary | ICD-10-CM | POA: Diagnosis not present

## 2024-05-26 DIAGNOSIS — M25561 Pain in right knee: Secondary | ICD-10-CM | POA: Diagnosis not present

## 2024-05-26 DIAGNOSIS — F17210 Nicotine dependence, cigarettes, uncomplicated: Secondary | ICD-10-CM | POA: Diagnosis not present

## 2024-05-26 DIAGNOSIS — Z79899 Other long term (current) drug therapy: Secondary | ICD-10-CM | POA: Diagnosis not present

## 2024-05-26 DIAGNOSIS — M25571 Pain in right ankle and joints of right foot: Secondary | ICD-10-CM | POA: Diagnosis not present

## 2024-05-26 DIAGNOSIS — G8929 Other chronic pain: Secondary | ICD-10-CM | POA: Diagnosis not present

## 2024-05-26 DIAGNOSIS — E669 Obesity, unspecified: Secondary | ICD-10-CM | POA: Diagnosis not present

## 2024-05-26 DIAGNOSIS — I10 Essential (primary) hypertension: Secondary | ICD-10-CM | POA: Diagnosis not present

## 2024-05-26 DIAGNOSIS — M25562 Pain in left knee: Secondary | ICD-10-CM | POA: Diagnosis not present

## 2024-05-31 DIAGNOSIS — Z79899 Other long term (current) drug therapy: Secondary | ICD-10-CM | POA: Diagnosis not present

## 2024-06-04 ENCOUNTER — Emergency Department
Admission: EM | Admit: 2024-06-04 | Discharge: 2024-06-04 | Disposition: A | Discharge status: Home / Self Care | Attending: Emergency Medicine | Admitting: Emergency Medicine

## 2024-06-04 ENCOUNTER — Encounter: Payer: Self-pay | Admitting: Emergency Medicine

## 2024-06-04 ENCOUNTER — Other Ambulatory Visit: Payer: Self-pay

## 2024-06-04 DIAGNOSIS — N939 Abnormal uterine and vaginal bleeding, unspecified: Secondary | ICD-10-CM | POA: Diagnosis not present

## 2024-06-04 DIAGNOSIS — R109 Unspecified abdominal pain: Secondary | ICD-10-CM | POA: Diagnosis present

## 2024-06-04 DIAGNOSIS — D72829 Elevated white blood cell count, unspecified: Secondary | ICD-10-CM | POA: Diagnosis not present

## 2024-06-04 LAB — URINALYSIS, ROUTINE W REFLEX MICROSCOPIC
Bacteria, UA: NONE SEEN
Bilirubin Urine: NEGATIVE
Glucose, UA: NEGATIVE mg/dL
Ketones, ur: NEGATIVE mg/dL
Leukocytes,Ua: NEGATIVE
Nitrite: NEGATIVE
Protein, ur: NEGATIVE mg/dL
Specific Gravity, Urine: 1.015 (ref 1.005–1.030)
pH: 5 (ref 5.0–8.0)

## 2024-06-04 LAB — COMPREHENSIVE METABOLIC PANEL WITH GFR
ALT: 19 U/L (ref 0–44)
AST: 18 U/L (ref 15–41)
Albumin: 4.5 g/dL (ref 3.5–5.0)
Alkaline Phosphatase: 91 U/L (ref 38–126)
Anion gap: 11 (ref 5–15)
BUN: 17 mg/dL (ref 6–20)
CO2: 24 mmol/L (ref 22–32)
Calcium: 10.8 mg/dL — ABNORMAL HIGH (ref 8.9–10.3)
Chloride: 100 mmol/L (ref 98–111)
Creatinine, Ser: 0.91 mg/dL (ref 0.44–1.00)
GFR, Estimated: >60 mL/min (ref >60)
Glucose, Bld: 109 mg/dL — ABNORMAL HIGH (ref 70–99)
Potassium: 3.5 mmol/L (ref 3.5–5.1)
Sodium: 135 mmol/L (ref 135–145)
Total Bilirubin: 0.8 mg/dL (ref 0.0–1.2)
Total Protein: 7.5 g/dL (ref 6.5–8.1)

## 2024-06-04 LAB — CBC WITH DIFFERENTIAL/PLATELET
Abs Immature Granulocytes: 0.04 K/uL (ref 0.00–0.07)
Basophils Absolute: 0.1 K/uL (ref 0.0–0.1)
Basophils Relative: 1 %
Eosinophils Absolute: 0.2 K/uL (ref 0.0–0.5)
Eosinophils Relative: 1 %
HCT: 45.1 % (ref 36.0–46.0)
Hemoglobin: 16.1 g/dL — ABNORMAL HIGH (ref 12.0–15.0)
Immature Granulocytes: 0 %
Lymphocytes Relative: 31 %
Lymphs Abs: 3.9 K/uL (ref 0.7–4.0)
MCH: 31.8 pg (ref 26.0–34.0)
MCHC: 35.7 g/dL (ref 30.0–36.0)
MCV: 89.1 fL (ref 80.0–100.0)
Monocytes Absolute: 0.8 K/uL (ref 0.1–1.0)
Monocytes Relative: 6 %
Neutro Abs: 7.6 K/uL (ref 1.7–7.7)
Neutrophils Relative %: 61 %
Platelets: 335 K/uL (ref 150–400)
RBC: 5.06 MIL/uL (ref 3.87–5.11)
RDW: 12.2 % (ref 11.5–15.5)
WBC: 12.6 K/uL — ABNORMAL HIGH (ref 4.0–10.5)
nRBC: 0 % (ref 0.0–0.2)

## 2024-06-04 LAB — WET PREP, GENITAL
Clue Cells Wet Prep HPF POC: NONE SEEN
Sperm: NONE SEEN
Trich, Wet Prep: NONE SEEN
WBC, Wet Prep HPF POC: <10 (ref <10)
Yeast Wet Prep HPF POC: NONE SEEN

## 2024-06-04 LAB — CHLAMYDIA/NGC RT PCR (ARMC ONLY)
Chlamydia Tr: NOT DETECTED
N gonorrhoeae: NOT DETECTED

## 2024-06-04 LAB — LIPASE, BLOOD: Lipase: 25 U/L (ref 11–51)

## 2024-06-04 NOTE — ED Provider Notes (Signed)
 [facility] Provider Note    Event Date/Time   First MD Initiated Contact with Patient 06/04/24 [phone]     (approximate)   History   Vaginal Bleeding   HPI Michelle Simpson is a 53 y.o. female who presents for evaluation of vaginal bleeding.  She is not having any pain more than usual (she has chronic pain in her back and in her left knee.  Perhaps a bit of abdominal cramping.  She noticed some blood when she wiped after urinating.  This happened a couple of times so she reports that she inserted some toilet paper or other tissue into her vagina to make sure the blood was coming from within the vagina rather than from her urine, and she said that she verified it was vaginal.  This was surprising to her because she has had a vaginal hysterectomy including removal of her cervix and one of her ovaries and fallopian tubes.  The surgeries occurred more than 10 years ago.  She has not had a pelvic exam for about 14 years.  She is having no burning when she urinates and no obvious gross blood.  She reports that she has not had intercourse in 2 years and has had no recent trauma.     Physical Exam   Triage Vital Signs: ED Triage Vitals  Encounter Vitals Group     BP 06/04/24 0413 (!) 149/114     Girls Systolic BP Percentile --      Girls Diastolic BP Percentile --      Boys Systolic BP Percentile --      Boys Diastolic BP Percentile --      Pulse Rate 06/04/24 0413 86     Resp 06/04/24 0413 16     Temp 06/04/24 0413 98.4 F (36.9 C)     Temp Source 06/04/24 0413 Oral     SpO2 06/04/24 0413 96 %     Weight 06/04/24 0414 103.4 kg (228 lb)     Height 06/04/24 0414 1.702 m (5' 7)     Head Circumference --      Peak Flow --      Pain Score 06/04/24 0413 7     Pain Loc --      Pain Education --      Exclude from Growth Chart --     Most recent vital signs: Vitals:   06/04/24 0413  BP: (!) 149/114  Pulse: 86  Resp: 16  Temp: 98.4 F (36.9 C)  SpO2: 96%     General: Awake, no distress.  CV:  Good peripheral perfusion.  Resp:  Normal effort. Speaking easily and comfortably, no accessory muscle usage nor intercostal retractions.   Abd:  No distention.  Other:  ED nurse Donny present as chaperone throughout pelvic exam.  The patient has no blood in the vagina at this time.  The exam is somewhat limited by the discomfort the patient is experiencing after not having had an exam or sexual intercourse for years.  She has no obvious external lesions such as herpetic lesions.  She does have some changes in the vaginal wall with some abnormal appearing tissue including some friable areas that are not currently bleeding.    ED Results / Procedures / Treatments   Labs (all labs ordered are listed, but only abnormal results are displayed) Labs Reviewed  CBC WITH DIFFERENTIAL/PLATELET - Abnormal; Notable for the following components:      Result Value   WBC 12.6 (*)  Hemoglobin 16.1 (*)    All other components within normal limits  COMPREHENSIVE METABOLIC PANEL WITH GFR - Abnormal; Notable for the following components:   Glucose, Bld 109 (*)    Calcium  10.8 (*)    All other components within normal limits  URINALYSIS, ROUTINE W REFLEX MICROSCOPIC - Abnormal; Notable for the following components:   Color, Urine YELLOW (*)    APPearance CLEAR (*)    Hgb urine dipstick MODERATE (*)    All other components within normal limits  WET PREP, GENITAL  CHLAMYDIA/NGC RT PCR ([facility] ONLY)            URINE CULTURE  LIPASE, BLOOD       PROCEDURES:  Critical Care performed: No  Procedures    IMPRESSION / MDM / ASSESSMENT AND PLAN / ED COURSE  I reviewed the triage vital signs and the nursing notes.                              Differential diagnosis includes, but is not limited to, neoplastic process, infection, vaginal cuff dehiscence.  Patient's presentation is most consistent with acute presentation with potential threat to life or bodily  function.  Labs/studies ordered: Wet prep, gonorrhea/chlamydia, lipase, CBC with differential, CMP, urinalysis, urine culture.  Interventions/Medications given:  Medications - No data to display  (Note:  hospital course my include additional interventions and/or labs/studies not listed above.)   Patient's workup was generally reassuring, minimal leukocytosis but also slight hemoconcentration.  No indication of acute infectious process.  Normal metabolic panel.  She has some hemoglobinuria but this could be from her vagina.  She declines an In-N-Out catheterization to look for evidence of hematuria which I think is reasonable.  No obvious vaginal bleeding at this time but there are some skin changes that are worrisome on the left vaginal wall.  No role for ultrasound at this time given her hysterectomy.  I recommended close outpatient follow-up in GYN clinic for further evaluation and she understands and agrees with the plan.  The patient's medical screening exam is reassuring with no indication of an emergent medical condition requiring hospitalization or additional evaluation at this point.  The patient is safe and appropriate for discharge and outpatient follow up.         FINAL CLINICAL IMPRESSION(S) / ED DIAGNOSES   Final diagnoses:  Vaginal bleeding     Rx / DC Orders   ED Discharge Orders     None        Note:  This document was prepared using Dragon voice recognition software and may include unintentional dictation errors.   Gordan Huxley, MD 06/04/24 [id number]

## 2024-06-04 NOTE — ED Triage Notes (Signed)
 Pt to ED from home c/o vaginal bleeding since 2200 tonight, bright red bleeding, also intermittent clear vaginal discharge.  States hx of hysterectomy, polyps, and endometriosis.  Some lower abd cramping that is now in lower back.

## 2024-06-04 NOTE — Discharge Instructions (Signed)
 As we discussed, your evaluation was generally reassuring, but you have some changes to the vaginal wall that may be the source of the blood you saw.  We strongly encourage you to call the office of Dr. Verdon at schedule follow-up appointment with her or one of her GYN colleagues.  They will be able to further examine you and determine if you need any additional evaluation or treatment.  Continue with your current medications and call on Monday morning to the office at the number provided to schedule the next available follow-up appointment.

## 2024-06-05 LAB — URINE CULTURE: Culture: 1000 — AB

## 2024-06-14 ENCOUNTER — Other Ambulatory Visit: Payer: Self-pay | Admitting: Obstetrics and Gynecology

## 2024-06-14 DIAGNOSIS — N809 Endometriosis, unspecified: Secondary | ICD-10-CM | POA: Diagnosis not present

## 2024-06-14 DIAGNOSIS — Z720 Tobacco use: Secondary | ICD-10-CM | POA: Diagnosis not present

## 2024-06-14 DIAGNOSIS — R102 Pelvic and perineal pain: Secondary | ICD-10-CM | POA: Diagnosis not present

## 2024-06-14 DIAGNOSIS — Z1272 Encounter for screening for malignant neoplasm of vagina: Secondary | ICD-10-CM | POA: Diagnosis not present

## 2024-06-14 DIAGNOSIS — Z0001 Encounter for general adult medical examination with abnormal findings: Secondary | ICD-10-CM | POA: Diagnosis not present

## 2024-06-14 DIAGNOSIS — Z1231 Encounter for screening mammogram for malignant neoplasm of breast: Secondary | ICD-10-CM

## 2024-06-14 DIAGNOSIS — N950 Postmenopausal bleeding: Secondary | ICD-10-CM | POA: Diagnosis not present

## 2024-06-15 MED ORDER — SEMAGLUTIDE-WEIGHT MANAGEMENT 0.25 MG/0.5ML ~~LOC~~ SOAJ: 0.2500 mg | SUBCUTANEOUS | 1 refills | Status: DC

## 2024-06-21 DIAGNOSIS — Z79899 Other long term (current) drug therapy: Secondary | ICD-10-CM | POA: Diagnosis not present

## 2024-06-21 DIAGNOSIS — I10 Essential (primary) hypertension: Secondary | ICD-10-CM | POA: Diagnosis not present

## 2024-06-21 DIAGNOSIS — G8929 Other chronic pain: Secondary | ICD-10-CM | POA: Diagnosis not present

## 2024-06-21 DIAGNOSIS — E669 Obesity, unspecified: Secondary | ICD-10-CM | POA: Diagnosis not present

## 2024-06-21 DIAGNOSIS — E559 Vitamin D deficiency, unspecified: Secondary | ICD-10-CM | POA: Diagnosis not present

## 2024-06-21 DIAGNOSIS — F17210 Nicotine dependence, cigarettes, uncomplicated: Secondary | ICD-10-CM | POA: Diagnosis not present

## 2024-06-23 DIAGNOSIS — Z79899 Other long term (current) drug therapy: Secondary | ICD-10-CM | POA: Diagnosis not present

## 2024-06-30 ENCOUNTER — Ambulatory Visit
Admission: RE | Admit: 2024-06-30 | Discharge: 2024-06-30 | Disposition: A | Discharge status: Home / Self Care | Source: Ambulatory Visit | Attending: Obstetrics and Gynecology | Admitting: Obstetrics and Gynecology

## 2024-06-30 DIAGNOSIS — Z1231 Encounter for screening mammogram for malignant neoplasm of breast: Secondary | ICD-10-CM | POA: Insufficient documentation

## 2024-07-04 DIAGNOSIS — Z720 Tobacco use: Secondary | ICD-10-CM | POA: Diagnosis not present

## 2024-07-04 DIAGNOSIS — Z90710 Acquired absence of both cervix and uterus: Secondary | ICD-10-CM | POA: Insufficient documentation

## 2024-07-04 DIAGNOSIS — R102 Pelvic and perineal pain: Secondary | ICD-10-CM | POA: Diagnosis not present

## 2024-07-04 DIAGNOSIS — N809 Endometriosis, unspecified: Secondary | ICD-10-CM | POA: Diagnosis not present

## 2024-07-04 DIAGNOSIS — N950 Postmenopausal bleeding: Secondary | ICD-10-CM | POA: Diagnosis not present

## 2024-07-04 DIAGNOSIS — D45 Polycythemia vera: Secondary | ICD-10-CM | POA: Diagnosis not present

## 2024-07-04 DIAGNOSIS — N83201 Unspecified ovarian cyst, right side: Secondary | ICD-10-CM | POA: Insufficient documentation

## 2024-07-04 NOTE — Patient Instructions (Signed)
 Check to see when your last tetanus shot was  Vaccines You are due for multiple vaccines. You are due for: 2024 COVID booster Shingles (zoster) vaccine- series of 2 vaccines for adults 53 years old and older Pneumonia (pneumococcal)-  for adults 51 years old and older  You can go to any pharmacy to get these.

## 2024-07-10 ENCOUNTER — Encounter: Payer: Self-pay | Admitting: Internal Medicine

## 2024-07-13 DIAGNOSIS — N83201 Unspecified ovarian cyst, right side: Secondary | ICD-10-CM | POA: Diagnosis not present

## 2024-07-13 DIAGNOSIS — Z90710 Acquired absence of both cervix and uterus: Secondary | ICD-10-CM | POA: Diagnosis not present

## 2024-07-13 DIAGNOSIS — N809 Endometriosis, unspecified: Secondary | ICD-10-CM | POA: Diagnosis not present

## 2024-07-13 DIAGNOSIS — R7989 Other specified abnormal findings of blood chemistry: Secondary | ICD-10-CM | POA: Diagnosis not present

## 2024-07-13 NOTE — Progress Notes (Signed)
 GYN Video Clinic Note Start time: 3:55pm Stop time: 3:59pm CC: right ovarian cyst   HPI: Michelle Simpson 53 y.o. 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   Last seen on 07/04/24. Denies bleeding since. Patient reports desire for ovaries to be removed regardless.   PCP: ROBERT CHAUVIN, PA   ROS: All other systems reviewed and negative   PMHx: Past Medical History:  Diagnosis Date  . Depression   . Endometriosis of uterus 2004   Hyaterectomy then later surgery to remove cervix and 1 ovary  . H/O blood clots   . Hives   . Hyperlipidemia   . Hypertension   . Osteoarthritis 11/16/2010  . Osteoporosis   . Venous thromboembolism 2005   I have polycenthemia vera      PSHx: Past Surgical History:  Procedure Laterality Date  . Tubes in ears  11/17/1975  . DILATION AND CURETTAGE, DIAGNOSTIC / THERAPEUTIC  11/16/1993  . Laser surgery (endometriosis)  11/16/2002  . CHOLECYSTECTOMY  09/16/2005  . HYSTERECTOMY  11/16/2008  . Plate in right lower leg  12/15/2008   Rt ankle Fx  . KNEE ARTHROSCOPY Left 03/25/2012   Left knee arthroscopy, partial medial meniscectomy, patellofemoral chondroplasty  . CHOLECYSTECTOMY  09-2005  . DILATION AND CURETTAGE OF UTERUS  1995   Incomplete miscarriage  . FRACTURE SURGERY  12-15-2008   Rt ankle plate  . OOPHORECTOMY  2010  . PELVIC LAPAROSCOPY  2003 or 2004  . Tear duct surgery  1972     OBHx: OB History  Gravida Para Term Preterm AB Living  3 1  1 2 1   SAB IAB Ectopic Molar Multiple Live Births  2     1    # Outcome Date GA Lbr Len/2nd Weight Sex Type Anes PTL Lv  3 Preterm      Vag-Spont   LIV  2 SAB           1 SAB              GYN Hx: - LMP: No LMP recorded. Patient has had a hysterectomy.  - Menopause: unsure s/p hysterectomy, never been on HRT,  denies PMB until this July 2025 - Pap hx: Denies any history of abnormals, last unknown. Never had any excisional procedures - STI hx: Denies any history of STIs  - Sexual preference: Not sexually active - Abdominal surgeries: chole, dx lap excision endometriosis, TLH BS 2005 for endometriosis > laparoscopic trachelectomy and LO 2011 - GYN procedures: D&C     FHx: Denies FHx of ovarian, breast, uterine, cervical, and colon cancer   Meds: Current Outpatient Medications on File Prior to Visit  Medication Sig Dispense Refill  . acetaminophen  (TYLENOL ) 500 MG tablet Take 500 mg by mouth every 6 (six) hours as needed for Pain. (Patient not taking: Reported on 07/13/2024)    . aspirin (ADULT LOW DOSE ASPIRIN) 81 MG EC tablet Take 81 mg by mouth. (Patient not taking: Reported on 07/13/2024)    . atorvastatin  (LIPITOR) 10 MG tablet     . baclofen  (LIORESAL ) 10 MG tablet Take 20 mg by mouth 3 (three) times daily.  (Patient not taking: Reported on 07/13/2024)    . bisoprolol  (ZEBETA ) 5 MG tablet Take 0.5 tablets (2.5 mg total) by mouth once daily. (Patient not taking: Reported on 07/13/2024) 90 tablet 4  .  ergocalciferol, vitamin D2, 1,250 mcg (50,000 unit) capsule Take 50,000 Units by mouth every 7 (seven) days    . famotidine (PEPCID) 40 MG tablet Take 40 mg by mouth    . hydrALAZINE  (APRESOLINE ) 25 MG tablet     . hydroCHLOROthiazide  (HYDRODIURIL ) 25 MG tablet Take 25 mg by mouth once daily    . HYDROcodone -acetaminophen  (NORCO) 5-325 mg tablet Take 1 tablet by mouth 2 (two) times daily as needed. (Patient not taking: Reported on 07/13/2024) 20 tablet 0  . losartan  (COZAAR ) 50 MG tablet     . methocarbamoL  (ROBAXIN ) 500 MG tablet     . naloxone (NARCAN) 4 mg/actuation nasal spray     . omega-3 fatty acids-fish oil (FISH OIL EXTRA STRENGTH) 435-880 mg Cap Take 1,000 mg by mouth. (Patient not taking: Reported on 07/13/2024)    . oxyCODONE -acetaminophen  (PERCOCET) 5-325 mg tablet Take 1 tablet by mouth     . potassium chloride (KLOR-CON) 10 MEQ ER tablet Take by mouth. (Patient not taking: Reported on 07/13/2024)    . semaglutide  (WEGOVY ) 0.25 mg/0.5 mL pen injector Inject 0.25 mg subcutaneously     No current facility-administered medications on file prior to visit.     Allergies: Allergies  Allergen Reactions  . Amlodipine Swelling  . Aloe Vera Latex Gloves [Gloves, Latex With Aloe Vera] Hives    Urticaria.  . Atenolol Hives    Urticaria.  . Gabapentin Swelling  . Ibuprofen Other (See Comments)    With prolonged use swelling, HTN and congestion.  . Latex Hives  . Metoprolol Hives  . Metoprolol Succinate Other (See Comments)    Numbness, cold, pins and needles of feet.  . Mobic [Meloxicam] Other (See Comments)    Upset stomach.  . Nortriptyline Swelling  . Nsaids (Non-Steroidal Anti-Inflammatory Drug) Swelling  . Paroxetine Other (See Comments)    Hallucinations  . Paxil Cr [Paroxetine Hcl] Hallucination     SocHx:  Social History   Tobacco Use  . Smoking status: Every Day    Current packs/day: 1.00    Average packs/day: 0.7 packs/day for 33.7 years (24.7 ttl pk-yrs)    Types: Cigarettes    Start date: 11/16/1990  . Smokeless tobacco: Never  Substance Use Topics  . Alcohol use: Yes    Alcohol/week: 1.0 standard drink of alcohol    Types: 1 Glasses of wine per week    Comment: Occasionally, not weekly  . Drug use: Never      OBJECTIVE: There were no vitals taken for this visit. Video encounter Lungs: Able to easily speak in full sentences Psych: Normal affect   Pelvic ultrasound- 07/04/24 Endovaginal Imaging ================   Indication: Endovaginal imaging was necessary to evaluate the ovaries. Probe: [id number].   Uterus ======   Not examined Surgically Absent   Right Ovary =========   Visualized, Enlarged. Outline: smooth. Size 33 mm x 21 mm x 28 mm Cyst(s)    Complex unilocular with cystic and solid components; acoustic shadows not present; no Color  Doppler . Size 23.0 mm x 19.0 mm x 18 mm.        Mean 20.0 mm. Vol 4.119 cm   Left Ovary ========   Not visualized, left oophorectomy noted No cysts identified   Cul de Sac =========   Normal   Bladder ======   Visualized   Impression =========   Endovaginal sonogram was performed due to the indications outlined above.   The uterus is surgically absent. Vaginal cuff appears grossly normal.  Right ovary contains a complex unilocular cyst with solid and cystic components measuring approximately 23mm; patient experienced pain/discomfort in that area. Left ovary removed.   No free fluid seen  ASSESSMENT/PLAN: Michelle Simpson 52 y.o. 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    #Right ovarian cyst - 07/04/24 2.3x1.9x1.8cm complex right ovarian cyst - Tumor markers: CA19-9 elevated CA-125, HE4, CEA wnl - ROMA value low risk for postmenopausal  - Due to elevated CA19-9, referred to gyn oncology - Discussed results and referral - Discussed option for BSO with either GYN onc or myself depending on GYN ONC evaluation.   #Bleeding s/p hysterectomy - Labs 07/04/24 confirm post-menopausal - Differential: vaginal (atrophy, dysplasia, malignancy) vs urinary vs GI. Suspect vaginal atrophy vs urinary source.  - UA/Ucx neg 06/14/24. UA 07/04/24 with calcium  oxalate crystals and protein but no blood.  - Exam 06/14/24 s/p hysterectomy and with atrophy - Vaginal pap 06/14/24 NILM with neg HRHPV - Pelvic ultrasound 07/04/24 with right ovarian cyst- see above  #Pelvic pain - Could be secondary to right ovarian cyst or endometriosis - Contraindication to estrogen given polycythemia vera and tobacco use  #Tobacco use - PY history: 26- needs low dose screening CT. Patient will talk to her PCP - Explored  patient's readiness to quit. Patient not ready to quit  #Health maintenance: - STI screening: GC/CT/trich neg on 06/14/24 - Cervical cancer screening: no need to continue pap screening as s/p total hysterectomy for indications other than high grade precancerous lesion or cervical cancer  - Breast cancer screening: UTD, birads 1 on 06/30/24, next due August 2026 - Colon cancer screening: due, wants to talk to PCP - Pneumococcal vaccine: due, recommended - Zoster vaccine: due, recommended - Flu vaccine: not season - COVID vaccine: receivied initial series and 2 boosters, recommended 2024 booster - Tetanus vaccine: unsure, patient will check records - DM screening: due - BMI screening: BMI 35, discuss in future  RTC after GYN ONC visit    DEMETRA VAPORIS SCHERMERHORN, MD  This video encounter was conducted with the patient's (or proxy's) verbal consent via secure, interactive audio and video telecommunications while away from clinic/office/hospital.  The patient (or proxy) was instructed to have this encounter in a suitably private space and to only have persons present to whom they give permission to participate. In addition, patient identity was confirmed by use of name plus an additional identifier.  This visit was coded based on medical decision making (MDM).

## 2024-07-14 ENCOUNTER — Other Ambulatory Visit: Payer: Self-pay | Admitting: Physician Assistant

## 2024-07-14 DIAGNOSIS — E669 Obesity, unspecified: Secondary | ICD-10-CM

## 2024-07-17 NOTE — Telephone Encounter (Signed)
 Requested Prescriptions  Pending Prescriptions Disp Refills   WEGOVY  0.25 MG/0.5ML SOAJ SQ injection [Pharmacy Med Name: WEGOVY  0.25 MG/0.5ML SUBQ SOLN ML] 2 mL 1    Sig: INJECT 0.25MG  SUBCUTANEOUSLY ONCE A WEEK     Endocrinology:  Diabetes - GLP-1 Receptor Agonists - semaglutide  Failed - 07/17/2024  8:36 AM      Failed - HBA1C in normal range and within 180 days    Hgb A1c MFr Bld  Date Value Ref Range Status  12/17/2020 5.4 4.8 - 5.6 % Final    Comment:             Prediabetes: 5.7 - 6.4          Diabetes: >6.4          Glycemic control for adults with diabetes: <7.0          Passed - Cr in normal range and within 360 days    Creatinine  Date Value Ref Range Status  09/22/2012 0.86 0.60 - 1.30 mg/dL Final   Creatinine, Ser  Date Value Ref Range Status  06/04/2024 0.91 0.44 - 1.00 mg/dL Final         Passed - Valid encounter within last 6 months    Recent Outpatient Visits           2 months ago Primary hypertension   Why [facility], Janna, PA-C

## 2024-07-19 ENCOUNTER — Inpatient Hospital Stay: Attending: Obstetrics and Gynecology | Admitting: Obstetrics and Gynecology

## 2024-07-19 ENCOUNTER — Telehealth: Payer: Self-pay | Admitting: Obstetrics and Gynecology

## 2024-07-19 ENCOUNTER — Inpatient Hospital Stay

## 2024-07-19 VITALS — BP 146/92 | HR 84 | Temp 96.8°F | Resp 17 | Wt 225.3 lb

## 2024-07-19 DIAGNOSIS — Z90721 Acquired absence of ovaries, unilateral: Secondary | ICD-10-CM | POA: Insufficient documentation

## 2024-07-19 DIAGNOSIS — E669 Obesity, unspecified: Secondary | ICD-10-CM | POA: Insufficient documentation

## 2024-07-19 DIAGNOSIS — Z79899 Other long term (current) drug therapy: Secondary | ICD-10-CM | POA: Diagnosis not present

## 2024-07-19 DIAGNOSIS — D751 Secondary polycythemia: Secondary | ICD-10-CM | POA: Diagnosis not present

## 2024-07-19 DIAGNOSIS — M25571 Pain in right ankle and joints of right foot: Secondary | ICD-10-CM | POA: Diagnosis not present

## 2024-07-19 DIAGNOSIS — F17210 Nicotine dependence, cigarettes, uncomplicated: Secondary | ICD-10-CM | POA: Diagnosis not present

## 2024-07-19 DIAGNOSIS — N83201 Unspecified ovarian cyst, right side: Secondary | ICD-10-CM | POA: Insufficient documentation

## 2024-07-19 DIAGNOSIS — Z6835 Body mass index (BMI) 35.0-35.9, adult: Secondary | ICD-10-CM | POA: Diagnosis not present

## 2024-07-19 DIAGNOSIS — M797 Fibromyalgia: Secondary | ICD-10-CM | POA: Diagnosis not present

## 2024-07-19 DIAGNOSIS — D6851 Activated protein C resistance: Secondary | ICD-10-CM | POA: Diagnosis not present

## 2024-07-19 DIAGNOSIS — E538 Deficiency of other specified B group vitamins: Secondary | ICD-10-CM | POA: Insufficient documentation

## 2024-07-19 DIAGNOSIS — Z90710 Acquired absence of both cervix and uterus: Secondary | ICD-10-CM | POA: Diagnosis not present

## 2024-07-19 DIAGNOSIS — F4310 Post-traumatic stress disorder, unspecified: Secondary | ICD-10-CM | POA: Insufficient documentation

## 2024-07-19 DIAGNOSIS — M25562 Pain in left knee: Secondary | ICD-10-CM | POA: Diagnosis not present

## 2024-07-19 DIAGNOSIS — E785 Hyperlipidemia, unspecified: Secondary | ICD-10-CM | POA: Insufficient documentation

## 2024-07-19 DIAGNOSIS — I10 Essential (primary) hypertension: Secondary | ICD-10-CM | POA: Diagnosis not present

## 2024-07-19 DIAGNOSIS — M549 Dorsalgia, unspecified: Secondary | ICD-10-CM | POA: Diagnosis not present

## 2024-07-19 DIAGNOSIS — Z86718 Personal history of other venous thrombosis and embolism: Secondary | ICD-10-CM | POA: Diagnosis not present

## 2024-07-19 DIAGNOSIS — M25561 Pain in right knee: Secondary | ICD-10-CM | POA: Diagnosis not present

## 2024-07-19 DIAGNOSIS — E559 Vitamin D deficiency, unspecified: Secondary | ICD-10-CM | POA: Insufficient documentation

## 2024-07-19 NOTE — Telephone Encounter (Signed)
 Error

## 2024-07-19 NOTE — Progress Notes (Signed)
 Gynecologic Oncology Consult Visit   Referring Provider: Dr JONETTA Dinsmore  Chief Concern: ovarian cyst, vaginal bleeding  Subjective:  Michelle Simpson is a 53 yo G3P0121 with a hx of obesity, endometriosis s/p hysterectomy, polycythemia vera, pernicious anemia, arthritis on chronic opioids, HTN, HLD, tobacco use, and vitamin D deficiency referred for right ovarian cyst.   Seen in the ED on 06/04/24 for postmenopausal bleeding. Exam without blood in the vagina. Hb 16.1 and WBC count 12.6. CMP wnl. UA moderate Hb but otherwise neg. Ucx 1000 colonies GBS. Lipase neg. GC/CT neg. Wet prep neg. PAP negative   Patient reports a couple of weeks ago she starting having pelvic cramping and then went to the bathroom and saw bright red blood in her underwear and realized it was from the vagina. Continued for hours. No bleeding after that but continued to have cramping (felt like menstrual cramps). Then one week later had some spotting for two days- last three days ago. Denies hematuria, dysuria, blood in stool. Reports BMs every two days.   Pelvic ultrasound- 07/04/24 Endovaginal Imaging ================  Indication: Endovaginal imaging was necessary to evaluate the ovaries.    Uterus ======  Not examined Surgically Absent  Right Ovary =========  Visualized, Enlarged. Outline: smooth. Size 33 mm x 21 mm x 28 mm Cyst(s)    Complex unilocular with cystic and solid components; acoustic shadows not present; no Color Doppler . Size 23.0 mm x 19.0 mm x 18 mm.        Mean 20.0 mm. Vol 4.119 cm  Left Ovary ========  Not visualized, left oophorectomy noted No cysts identified  Cul de Sac =========  Normal  Bladder ======  Visualized  Impression =========    The uterus is surgically absent. Vaginal cuff appears grossly normal.  Right ovary contains a complex unilocular cyst with solid and cystic components measuring approximately 23mm; patient experienced pain/discomfort in that area. Left ovary  removed. No free fluid seen  Tumor markers: CA19-9 elevated to 56 CA-125 10.7, HE4 54.1, CEA 2.0 Inhibin A 1.1 and inhibin B <7, FSH 81.5 ROMA value low risk for postmenopausal   Denies any history of STIs.  Not sexually active  Abdominal surgeries: chole, dx lap excision endometriosis, TLH BS 2005 for endometriosis, laparoscopic trachelectomy and LO 2011 with Dr Herb. Had cuff abscess post op. No hormonal therapy after that.  GYN procedures: D&C  Reports has quit smoking a few times in the past but in general goes back due to stress and the social aspect.     Problem List: Patient Active Problem List   Diagnosis Date Noted   Mild intermittent asthma without complication 11/18/2023   Polycythemia 11/18/2023   Tobacco abuse counseling 11/18/2023   Obesity (BMI 30-39.9) 01/21/2023   Snoring 04/15/2021   Smoker 04/15/2021   History of fibromyalgia 03/05/2021   B12 deficiency 03/05/2021   Vitamin D insufficiency 03/05/2021   Fatigue 03/05/2021   Muscle spasm of back 02/11/2021   PTSD (post-traumatic stress disorder) 02/03/2021   Bereavement 02/03/2021   Spondylosis of lumbar region without myelopathy or radiculopathy 07/15/2016   Irritable bowel syndrome 04/29/2016   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   Chronic pelvic pain in female 03/20/2016   Allergic rhinitis 03/20/2016   Hyperlipidemia 03/20/2016   Osteoarthritis of hip 03/20/2016   Heterozygous factor V Leiden mutation (HCC) 03/20/2016   History of deep venous thrombosis 03/20/2016   Primary osteoarthritis of both knees 10/23/2014   DDD (degenerative disc disease), lumbar 10/04/2014  Chondromalacia of knee, 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   Neuritis or radiculitis due to rupture of lumbar intervertebral disc 09/12/2014   Lumbar radiculitis 09/12/2014   Edema of lower extremity 09/05/2014   Bilateral leg edema 09/05/2014   Hip impingement syndrome 06/19/2014   Endometriosis 05/06/2010   Primary hypertension 03/11/2009    Past  Medical History: Past Medical History:  Diagnosis Date   B12 deficiency    Degenerative disc disease, lumbar    Depression    DJD (degenerative joint disease)    Eczema    Edema    Endometriosis    High mean corpuscular hemoglobin concentration (MCHC)    History of DVT (deep vein thrombosis)    Hypertension    IBS (irritable bowel syndrome)    Osteoarthritis of right hip    Panic attack    Pernicious anemia     Past Surgical History: Past Surgical History:  Procedure Laterality Date   ABDOMINAL HYSTERECTOMY     ANKLE SURGERY Right    ANKLE SURGERY Right    plate placed   Bilateral knee RFA nerve ablation @ [facility]     CHOLECYSTECTOMY     KNEE SURGERY Left 2012   LACRIMAL DUCT RECONSTRUCTION     LAPAROSCOPY     miscarriage D&C  1995   TYMPANOSTOMY TUBE PLACEMENT  1977     Family History: Family History  Problem Relation Age of Onset   Hypertension Father    Hypertension Mother    Deep vein thrombosis Mother    Narcolepsy Mother    Anxiety disorder Mother    Hypertension Maternal Grandmother    Hypertension Paternal Grandmother    Hypertension Brother    Heart attack Brother    Liver disease Brother     Social History: Social History   Socioeconomic History   Marital status: Divorced    Spouse name: Not on file   Number of children: 1   Years of education: Not on file   Highest education level: Bachelor's degree (e.g., BA, AB, BS)  Occupational History   Not on file  Tobacco Use   Smoking status: Every Day    Current packs/day: 1.00    Average packs/day: 1 pack/day for 20.0 years (20.0 ttl pk-yrs)    Types: Cigarettes   Smokeless tobacco: Never   Tobacco comments:    1 ppd - reported 04/29/2021  Vaping Use   Vaping status: Former   Start date: 11/17/2015   Quit date: 01/07/2020  Substance and Sexual Activity   Alcohol use: Not Currently    Alcohol/week: 0.0 standard drinks of alcohol   Drug use: No   Sexual activity: Never  Other Topics  Concern   Not on file  Social History Narrative   Not on file   Social Drivers of Health   Financial Resource Strain: Low Risk  (06/14/2024)   Received from [facility]   Overall Financial Resource Strain (CARDIA)    Difficulty of Paying Living Expenses: Not very hard  Food Insecurity: No Food Insecurity (06/14/2024)   Received from [facility]   Hunger Vital Sign    Within the past 12 months, you worried that your food would run out before you got the money to buy more.: Never true    Within the past 12 months, the food you bought just didn't last and you didn't have money to get more.: Never true  Transportation Needs: No Transportation Needs (06/14/2024)   Received from [facility]  PRAPARE - Transportation    In the past 12 months, has lack of transportation kept you from medical appointments or from getting medications?: No    Lack of Transportation (Non-Medical): No  Physical Activity: Inactive (05/17/2024)   Exercise Vital Sign    Days of Exercise per Week: 0 days    Minutes of Exercise per Session: Not on file  Stress: No Stress Concern Present (05/17/2024)   [employer] of Occupational Health - Occupational Stress Questionnaire    Feeling of Stress: Only a little  Social Connections: Moderately Isolated (05/17/2024)   Social Connection and Isolation Panel    Frequency of Communication with Friends and Family: More than three times a week    Frequency of Social Gatherings with Friends and Family: Twice a week    Attends Religious Services: 1 to 4 times per year    Active Member of [employer] or Organizations: No    Attends Banker Meetings: Not on file    Marital Status: Widowed  Intimate Partner Violence: Not on file    Allergies: Allergies  Allergen Reactions   Atenolol Hives    Urticaria.   Gabapentin Swelling   Nortriptyline Swelling   Amlodipine Swelling   Ibuprofen Other (See Comments)    With  prolonged use swelling, HTN and congestion.   Latex Hives    Urticaria.   Meloxicam     Other reaction(s): Other (See Comments) Upset stomach.   Metoprolol Hives   Nsaids Swelling   Paxil Cr [Paroxetine Hcl Er] Other (See Comments)    hallucination    Current Medications: Current Outpatient Medications  Medication Sig Dispense Refill   albuterol  (VENTOLIN  HFA) 108 (90 Base) MCG/ACT inhaler Inhale 2 puffs into the lungs every 6 (six) hours as needed for wheezing or shortness of breath. 18 g 2   atorvastatin  (LIPITOR) 10 MG tablet Take 1 tablet (10 mg total) by mouth daily. 90 tablet 3   famotidine (PEPCID) 40 MG tablet Take 40 mg by mouth daily. Take one tablet by mouth in the evening     hydrALAZINE  (APRESOLINE ) 25 MG tablet Take 1 tablet (25 mg total) by mouth 3 (three) times daily. 270 tablet 1   hydrochlorothiazide  (HYDRODIURIL ) 25 MG tablet Take 1 tablet (25 mg total) by mouth daily. 90 tablet 1   losartan  (COZAAR ) 50 MG tablet TAKE 1 TABLET BY MOUTH TWICE DAILY 180 tablet 1   methocarbamol  (ROBAXIN ) 500 MG tablet Take 1 tablet (500 mg total) by mouth 4 (four) times daily. 40 tablet 2   oxyCODONE -acetaminophen  (PERCOCET/ROXICET) 5-325 MG tablet Take 1 tablet by mouth every 6 (six) hours as needed. Take one tablet 5x daily     Vitamin D, Ergocalciferol, (DRISDOL) 1.25 MG (50000 UNIT) CAPS capsule Take 50,000 Units by mouth once a week.     WEGOVY  0.25 MG/0.5ML SOAJ SQ injection INJECT 0.25MG  SUBCUTANEOUSLY ONCE A WEEK 2 mL 1   No current facility-administered medications for this visit.    Review of Systems General: negative for, fevers, chills, fatigue, changes in sleep, changes in weight or appetite Skin: negative for changes in color, texture, moles or lesions Eyes: negative for, changes in vision, pain, diplopia HEENT: negative for, change in hearing, pain, discharge, tinnitus, vertigo, voice changes, sore throat, neck masses Pulmonary: negative for, dyspnea, orthopnea,  productive cough Cardiac: negative for, palpitations, syncope, pain, discomfort, pressure Gastrointestinal: negative for, dysphagia, nausea, vomiting, jaundice, pain, constipation, diarrhea, hematemesis, hematochezia Genitourinary/Sexual: negative for, dysuria, discharge, hesitancy, nocturia, retention, stones, infections,  STD's, incontinence Musculoskeletal: negative for, pain, stiffness, swelling, range of motion limitation Hematology: negative for, easy bruising, bleeding Neurologic/Psych: negative for, headaches, seizures, paralysis, weakness, tremor, change in gait, change in sensation, mood swings, depression, anxiety, change in memory  Objective:  Physical Examination:  Wt 225 lb 4.8 oz (102.2 kg)   BMI 35.29 kg/m    ECOG Performance Status: 1 - Symptomatic but completely ambulatory  General appearance: alert, cooperative, and appears stated age HEENT:neck supple with midline trachea and thyroid without masses Lymph node survey: non-palpable, axillary, inguinal, supraclavicular Cardiovascular: regular rate and rhythm, no murmurs or gallops Respiratory: normal air entry, lungs clear to auscultation and no rales, rhonchi or wheezing Abdomen: no hernias and well healed incision Back: inspection of back is normal Extremities: extremities normal, atraumatic, no cyanosis or edema Skin exam - normal coloration and turgor, no rashes, no suspicious skin lesions noted. Neurological exam reveals alert, oriented, normal speech, no focal findings or movement disorder noted.  Pelvic: exam chaperoned by nurse, EGBUS within normal limits;  Vulva: normal appearing vulva with no masses, tenderness or lesions; Vagina: normal vagina; Adnexa: normal adnexa in size, nontender and no masses; Uterus: uterus is normal size, shape, consistency and nontender absent; Cervix: absent; Rectal: not indicated      Assessment:  DENIELLE BAYARD is a 53 y.o. P55 female with recent episodes of vaginal bleeding.  No  lesion seen and PAP normal. She had an US  and was found to have a 2.3 cm complex right ovarian cystic lesion with solid areas, complex, but no color flow. History of dx lap excision endometriosis, TLH BS 2005 for endometriosis, laparoscopic trachelectomy and left oophorectomy 2011 with Dr. Herb for endometriosis. Had cuff abscess post op. No hormonal therapy after that.    Tumor markers: CA19-9 elevated to 56 Normal markers: CA-125 10.7, HE4 54.1, CEA 2.0 Inhibin A 1.1 and inhibin B <7,  ROMA value low risk for postmenopausal  FSH 81.5  Medical co-morbidities complicating care: SABRA  Plan:   Problem List Items Addressed This Visit       Endocrine   Right ovarian cyst - Primary   Relevant Orders   MR PELVIS W WO CONTRAST   Discussed with patient that the source of vaginal bleeding is unclear.  There is no lesion in the vagina, but there is some induration above the cuff, perhaps due to prior cuff abscess and history of endometriosis.   Small complex lesion in remaining right ovary. We will order an MRI for further evaluation and then make plans for surgery or surveillance.   The patient's diagnosis, an outline of the further diagnostic and laboratory studies which will be required, the recommendation, and alternatives were discussed.  All questions were answered to the patient's satisfaction.  A total of 40 minutes were spent with the patient/family today; 50% was spent in education, counseling and coordination of care for ovarian mass, vaginal bleeding.    Prentice Agent, MD  CC:  Schermerhorn, Beverli GAILS, MD [address] [place],  [place] [address] [phone]

## 2024-07-25 ENCOUNTER — Ambulatory Visit
Admission: RE | Admit: 2024-07-25 | Discharge: 2024-07-25 | Disposition: A | Discharge status: Home / Self Care | Source: Ambulatory Visit | Attending: Nurse Practitioner | Admitting: Nurse Practitioner

## 2024-07-25 DIAGNOSIS — N83201 Unspecified ovarian cyst, right side: Secondary | ICD-10-CM | POA: Diagnosis not present

## 2024-07-25 MED ORDER — GADOBUTROL 1 MMOL/ML IV SOLN
10.0000 mL | Freq: Once | INTRAVENOUS | Status: AC | PRN
  Administered 2024-07-25: 10 mL via INTRAVENOUS

## 2024-08-14 ENCOUNTER — Telehealth: Payer: Self-pay | Admitting: *Deleted

## 2024-08-14 ENCOUNTER — Encounter: Payer: Self-pay | Admitting: Physician Assistant

## 2024-08-14 ENCOUNTER — Other Ambulatory Visit: Payer: Self-pay

## 2024-08-14 DIAGNOSIS — E669 Obesity, unspecified: Secondary | ICD-10-CM

## 2024-08-14 MED ORDER — WEGOVY 0.25 MG/0.5ML ~~LOC~~ SOAJ: 0.2500 mg | SUBCUTANEOUS | 1 refills | Status: DC

## 2024-08-14 NOTE — Telephone Encounter (Signed)
 The patient would like to have the results of the MRI for her. Wants to know the next appt.

## 2024-08-15 ENCOUNTER — Encounter: Payer: Self-pay | Admitting: Internal Medicine

## 2024-08-18 DIAGNOSIS — Z131 Encounter for screening for diabetes mellitus: Secondary | ICD-10-CM | POA: Diagnosis not present

## 2024-08-18 DIAGNOSIS — Z79899 Other long term (current) drug therapy: Secondary | ICD-10-CM | POA: Diagnosis not present

## 2024-08-18 DIAGNOSIS — E559 Vitamin D deficiency, unspecified: Secondary | ICD-10-CM | POA: Diagnosis not present

## 2024-08-18 DIAGNOSIS — M25571 Pain in right ankle and joints of right foot: Secondary | ICD-10-CM | POA: Diagnosis not present

## 2024-08-18 DIAGNOSIS — F17210 Nicotine dependence, cigarettes, uncomplicated: Secondary | ICD-10-CM | POA: Diagnosis not present

## 2024-08-18 DIAGNOSIS — I10 Essential (primary) hypertension: Secondary | ICD-10-CM | POA: Diagnosis not present

## 2024-08-18 DIAGNOSIS — E669 Obesity, unspecified: Secondary | ICD-10-CM | POA: Diagnosis not present

## 2024-08-18 DIAGNOSIS — M25561 Pain in right knee: Secondary | ICD-10-CM | POA: Diagnosis not present

## 2024-08-18 DIAGNOSIS — M25562 Pain in left knee: Secondary | ICD-10-CM | POA: Diagnosis not present

## 2024-08-18 DIAGNOSIS — M129 Arthropathy, unspecified: Secondary | ICD-10-CM | POA: Diagnosis not present

## 2024-08-18 DIAGNOSIS — Z23 Encounter for immunization: Secondary | ICD-10-CM | POA: Diagnosis not present

## 2024-08-18 DIAGNOSIS — M549 Dorsalgia, unspecified: Secondary | ICD-10-CM | POA: Diagnosis not present

## 2024-08-18 LAB — COMPREHENSIVE METABOLIC PANEL WITH GFR
Albumin: 4.4 (ref 3.5–5.0)
Calcium: 10.8 — AB (ref 8.7–10.7)
Globulin: 2.3
eGFR: 77

## 2024-08-18 LAB — BASIC METABOLIC PANEL WITH GFR
BUN: 8 (ref 4–21)
CO2: 27 — AB (ref 13–22)
Chloride: 102 (ref 99–108)
Creatinine: 0.8 (ref 0.5–1.1)
Glucose: 118
Potassium: 3.8 meq/L (ref 3.5–5.1)
Sodium: 147 (ref 137–147)

## 2024-08-18 LAB — HEPATIC FUNCTION PANEL
ALT: 16 U/L (ref 7–35)
AST: 14 (ref 13–35)
Alkaline Phosphatase: 113 (ref 25–125)
Bilirubin, Total: 0.4

## 2024-08-18 LAB — CBC: RBC: 5.1 (ref 3.87–5.11)

## 2024-08-18 LAB — CBC AND DIFFERENTIAL
HCT: 48 — AB (ref 36–46)
Hemoglobin: 16.2 — AB (ref 12.0–16.0)
Platelets: 330 K/uL (ref 150–400)
WBC: 9.6

## 2024-08-18 LAB — VITAMIN D 25 HYDROXY (VIT D DEFICIENCY, FRACTURES): Vit D, 25-Hydroxy: 75.21

## 2024-08-18 LAB — HEMOGLOBIN A1C
EGFR (Non-African Amer.): 77
Hemoglobin A1C: 5.2

## 2024-08-21 ENCOUNTER — Telehealth: Payer: Self-pay

## 2024-08-21 NOTE — Telephone Encounter (Signed)
 Scheduling message sent to arrange appointment with Dr. Mancil for MRI results.

## 2024-08-22 DIAGNOSIS — Z79899 Other long term (current) drug therapy: Secondary | ICD-10-CM | POA: Diagnosis not present

## 2024-08-30 ENCOUNTER — Encounter: Payer: Self-pay | Admitting: Obstetrics and Gynecology

## 2024-08-30 ENCOUNTER — Inpatient Hospital Stay: Attending: Obstetrics and Gynecology | Admitting: Obstetrics and Gynecology

## 2024-08-30 VITALS — BP 140/96 | HR 73 | Resp 18 | Ht 67.0 in | Wt 221.0 lb

## 2024-08-30 DIAGNOSIS — Z9079 Acquired absence of other genital organ(s): Secondary | ICD-10-CM | POA: Diagnosis not present

## 2024-08-30 DIAGNOSIS — Z90722 Acquired absence of ovaries, bilateral: Secondary | ICD-10-CM | POA: Insufficient documentation

## 2024-08-30 DIAGNOSIS — Z90710 Acquired absence of both cervix and uterus: Secondary | ICD-10-CM | POA: Diagnosis not present

## 2024-08-30 DIAGNOSIS — R978 Other abnormal tumor markers: Secondary | ICD-10-CM | POA: Insufficient documentation

## 2024-08-30 DIAGNOSIS — Z1211 Encounter for screening for malignant neoplasm of colon: Secondary | ICD-10-CM | POA: Diagnosis not present

## 2024-08-30 DIAGNOSIS — N83201 Unspecified ovarian cyst, right side: Secondary | ICD-10-CM | POA: Diagnosis not present

## 2024-08-30 DIAGNOSIS — F17200 Nicotine dependence, unspecified, uncomplicated: Secondary | ICD-10-CM

## 2024-08-30 NOTE — Progress Notes (Signed)
 Gynecologic Oncology Consult Visit   Referring Provider: Dr JONETTA Dinsmore  Chief Concern: ovarian cyst, vaginal bleeding  Subjective:  Michelle Simpson is a 53 yo G3P0121 with a hx of obesity, endometriosis s/p hysterectomy, polycythemia vera, pernicious anemia, arthritis on chronic opioids, HTN, HLD, tobacco use, and vitamin D deficiency referred for right ovarian cyst.   Returns today to discuss MRI results. Still has occasional spotting and cramps. Not sexually active. She has been on [facility] for 4 months and lost 35 pounds.  May have to stop though due to loss of insurance coverage.  MRI 07/25/24 FINDINGS: Urinary Tract:  No abnormality visualized. Bowel:  Unremarkable visualized pelvic bowel loops. Vascular/Lymphatic: No pathologically enlarged lymph nodes. No significant vascular abnormality seen. Reproductive: Hysterectomy. Normal postmenopausal appearance of the right ovary (series 3, image 17). No visible left ovary, presumed surgically absent. No mass, cyst, or other abnormality. Specifically, no evidence of pelvic endometriosis. Other:  None. Musculoskeletal: No suspicious bone lesions identified.   IMPRESSION: 1. Normal postmenopausal appearance of the right ovary. No visible left ovary, presumed surgically absent. No mass, cyst, or other abnormality. Specifically, no evidence of pelvic endometriosis. 2. Hysterectomy.  Gyn history Seen in the ED on 06/04/24 for postmenopausal bleeding. Exam without blood in the vagina. Hb 16.1 and WBC count 12.6. CMP wnl. UA moderate Hb but otherwise neg. Ucx 1000 colonies GBS. Lipase neg. GC/CT neg. Wet prep neg. PAP negative   Patient reports a couple of weeks ago she starting having pelvic cramping and then went to the bathroom and saw bright red blood in her underwear and realized it was from the vagina. Continued for hours. No bleeding after that but continued to have cramping (felt like menstrual cramps). Then one week later had some  spotting for two days- last three days ago. Denies hematuria, dysuria, blood in stool. Reports BMs every two days.   Pelvic ultrasound- 07/04/24 Endovaginal Imaging ================  Indication: Endovaginal imaging was necessary to evaluate the ovaries.    Uterus ======  Not examined Surgically Absent  Right Ovary =========  Visualized, Enlarged. Outline: smooth. Size 33 mm x 21 mm x 28 mm Cyst(s)    Complex unilocular with cystic and solid components; acoustic shadows not present; no Color Doppler . Size 23.0 mm x 19.0 mm x 18 mm.        Mean 20.0 mm. Vol 4.119 cm  Left Ovary ========  Not visualized, left oophorectomy noted No cysts identified  Cul de Sac =========  Normal  Bladder ======  Visualized  Impression =========    The uterus is surgically absent. Vaginal cuff appears grossly normal.  Right ovary contains a complex unilocular cyst with solid and cystic components measuring approximately 23mm; patient experienced pain/discomfort in that area. Left ovary removed. No free fluid seen  Tumor markers 8/25: CA19-9 elevated to 56 CA-125 10.7, HE4 54.1, CEA 2.0 Inhibin A 1.1 and inhibin B <7, FSH 81.5 ROMA value low risk for postmenopausal  PAP and HPV negative  Denies any history of STIs.  Not sexually active  Abdominal surgeries: chole, dx lap excision endometriosis, TLH BS 2005 for endometriosis, laparoscopic trachelectomy and LO 2011 with Dr Herb. Had cuff abscess post op. No hormonal therapy after that.  GYN procedures: D&C  Reports has quit smoking a few times in the past but in general goes back due to stress and the social aspect.     Problem List: Patient Active Problem List   Diagnosis Date Noted   Right ovarian  cyst 07/04/2024   S/P hysterectomy 07/04/2024   Polycythemia vera (HCC) 07/04/2024   Mild intermittent asthma without complication 11/18/2023   Polycythemia 11/18/2023   Tobacco abuse counseling 11/18/2023   Obesity (BMI 30-39.9) 01/21/2023    Snoring 04/15/2021   Smoker 04/15/2021   History of fibromyalgia 03/05/2021   B12 deficiency 03/05/2021   Vitamin D insufficiency 03/05/2021   Fatigue 03/05/2021   Muscle spasm of back 02/11/2021   PTSD (post-traumatic stress disorder) 02/03/2021   Bereavement 02/03/2021   Spondylosis of lumbar region without myelopathy or radiculopathy 07/15/2016   Irritable bowel syndrome 04/29/2016   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   Chronic pelvic pain in female 03/20/2016   Allergic rhinitis 03/20/2016   Hyperlipidemia 03/20/2016   Osteoarthritis of hip 03/20/2016   Heterozygous factor V Leiden mutation 03/20/2016   History of deep venous thrombosis 03/20/2016   Primary osteoarthritis of both knees 10/23/2014   DDD (degenerative disc disease), lumbar 10/04/2014   Chondromalacia of knee, 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   Neuritis or radiculitis due to rupture of lumbar intervertebral disc 09/12/2014   Lumbar radiculitis 09/12/2014   Edema of lower extremity 09/05/2014   Bilateral leg edema 09/05/2014   Hip impingement syndrome 06/19/2014   Endometriosis 05/06/2010   Primary hypertension 03/11/2009    Past Medical History: Past Medical History:  Diagnosis Date   B12 deficiency    Degenerative disc disease, lumbar    Depression    DJD (degenerative joint disease)    Eczema    Edema    Endometriosis    High mean corpuscular hemoglobin concentration (MCHC)    History of DVT (deep vein thrombosis)    Hypertension    IBS (irritable bowel syndrome)    Osteoarthritis of right hip    Panic attack    Pernicious anemia     Past Surgical History: Past Surgical History:  Procedure Laterality Date   ABDOMINAL HYSTERECTOMY     ANKLE SURGERY Right    ANKLE SURGERY Right    plate placed   Bilateral knee RFA nerve ablation @ [facility]     CHOLECYSTECTOMY     KNEE SURGERY Left 2012   LACRIMAL DUCT RECONSTRUCTION     LAPAROSCOPY     miscarriage D&C  1995   TYMPANOSTOMY TUBE PLACEMENT  1977      Family History: Family History  Problem Relation Age of Onset   Hypertension Father    Hypertension Mother    Deep vein thrombosis Mother    Narcolepsy Mother    Anxiety disorder Mother    Hypertension Maternal Grandmother    Hypertension Paternal Grandmother    Hypertension Brother    Heart attack Brother    Liver disease Brother     Social History: Social History   Socioeconomic History   Marital status: Divorced    Spouse name: Not on file   Number of children: 1   Years of education: Not on file   Highest education level: Bachelor's degree (e.g., BA, AB, BS)  Occupational History   Not on file  Tobacco Use   Smoking status: Every Day    Current packs/day: 1.00    Average packs/day: 1 pack/day for 20.0 years (20.0 ttl pk-yrs)    Types: Cigarettes   Smokeless tobacco: Never   Tobacco comments:    1 ppd - reported 04/29/2021  Vaping Use   Vaping status: Former   Start date: 11/17/2015   Quit date: 01/07/2020  Substance and Sexual Activity   Alcohol use: Not Currently  Alcohol/week: 0.0 standard drinks of alcohol   Drug use: No   Sexual activity: Never    Birth control/protection: None  Other Topics Concern   Not on file  Social History Narrative   Not on file   Social Drivers of Health   Financial Resource Strain: Low Risk  (06/14/2024)   Received from [facility]   Overall Financial Resource Strain (CARDIA)    Difficulty of Paying Living Expenses: Not very hard  Food Insecurity: No Food Insecurity (07/19/2024)   Hunger Vital Sign    Worried About Running Out of Food in the Last Year: Never true    Ran Out of Food in the Last Year: Never true  Transportation Simpson: No Transportation Simpson (07/19/2024)   PRAPARE - Administrator, Civil Service (Medical): No    Lack of Transportation (Non-Medical): No  Physical Activity: Inactive (05/17/2024)   Exercise Vital Sign    Days of Exercise per Week: 0 days    Minutes of Exercise  per Session: Not on file  Stress: No Stress Concern Present (05/17/2024)   [employer] of Occupational Health - Occupational Stress Questionnaire    Feeling of Stress: Only a little  Social Connections: Moderately Isolated (05/17/2024)   Social Connection and Isolation Panel    Frequency of Communication with Friends and Family: More than three times a week    Frequency of Social Gatherings with Friends and Family: Twice a week    Attends Religious Services: 1 to 4 times per year    Active Member of [employer] or Organizations: No    Attends Banker Meetings: Not on file    Marital Status: Widowed  Intimate Partner Violence: Not At Risk (07/19/2024)   Humiliation, Afraid, Rape, and Kick questionnaire    Fear of Current or Ex-Partner: No    Emotionally Abused: No    Physically Abused: No    Sexually Abused: No    Allergies: Allergies  Allergen Reactions   Atenolol Hives    Urticaria.   Gabapentin Swelling   Nortriptyline Swelling   Amlodipine Swelling   Ibuprofen Other (See Comments)    With prolonged use swelling, HTN and congestion.   Latex Hives    Urticaria.   Meloxicam     Other reaction(s): Other (See Comments) Upset stomach.   Metoprolol Hives   Nsaids Swelling   Paxil Cr [Paroxetine Hcl Er] Other (See Comments)    hallucination    Current Medications: Current Outpatient Medications  Medication Sig Dispense Refill   albuterol  (VENTOLIN  HFA) 108 (90 Base) MCG/ACT inhaler Inhale 2 puffs into the lungs every 6 (six) hours as needed for wheezing or shortness of breath. 18 g 2   atorvastatin  (LIPITOR) 10 MG tablet Take 1 tablet (10 mg total) by mouth daily. 90 tablet 3   famotidine (PEPCID) 40 MG tablet Take 40 mg by mouth daily. Take one tablet by mouth in the evening     hydrALAZINE  (APRESOLINE ) 25 MG tablet Take 1 tablet (25 mg total) by mouth 3 (three) times daily. 270 tablet 1   hydrochlorothiazide  (HYDRODIURIL ) 25 MG tablet Take 1 tablet (25 mg total) by  mouth daily. 90 tablet 1   losartan  (COZAAR ) 50 MG tablet TAKE 1 TABLET BY MOUTH TWICE DAILY 180 tablet 1   methocarbamol  (ROBAXIN ) 500 MG tablet Take 1 tablet (500 mg total) by mouth 4 (four) times daily. 40 tablet 2   oxyCODONE -acetaminophen  (PERCOCET/ROXICET) 5-325 MG tablet Take 1 tablet by mouth  every 6 (six) hours as needed. Take one tablet 5x daily     semaglutide -weight management (WEGOVY ) 0.25 MG/0.5ML SOAJ SQ injection Inject 0.25 mg into the skin once a week. 2 mL 1   Vitamin D, Ergocalciferol, (DRISDOL) 1.25 MG (50000 UNIT) CAPS capsule Take 50,000 Units by mouth once a week.     naloxone (NARCAN) nasal spray 4 mg/0.1 mL  (Patient not taking: Reported on 08/30/2024)     No current facility-administered medications for this visit.    Review of Systems General: negative for, fevers, chills, fatigue, changes in sleep, changes in weight or appetite Skin: negative for changes in color, texture, moles or lesions Eyes: negative for, changes in vision, pain, diplopia HEENT: negative for, change in hearing, pain, discharge, tinnitus, vertigo, voice changes, sore throat, neck masses Pulmonary: negative for, dyspnea, orthopnea, productive cough Cardiac: negative for, palpitations, syncope, pain, discomfort, pressure Gastrointestinal: negative for, dysphagia, nausea, vomiting, jaundice, pain, constipation, diarrhea, hematemesis, hematochezia Genitourinary/Sexual: negative for, dysuria, discharge, hesitancy, nocturia, retention, stones, infections, STD's, incontinence Musculoskeletal: negative for, pain, stiffness, swelling, range of motion limitation Hematology: negative for, easy bruising, bleeding Neurologic/Psych: negative for, headaches, seizures, paralysis, weakness, tremor, change in gait, change in sensation, mood swings, depression, anxiety, change in memory  Objective:  Physical Examination:  BP (!) 140/96 (BP Location: Left Arm, Patient Position: Sitting)   Pulse 73   Resp 18    Ht 5' 7 (1.702 m)   Wt 221 lb (100.2 kg)   SpO2 95%   BMI 34.61 kg/m    ECOG Performance Status: 1 - Symptomatic but completely ambulatory  General appearance: alert, cooperative, and appears stated age HEENT:neck supple with midline trachea and thyroid without masses Lymph node survey: non-palpable, axillary, inguinal, supraclavicular Cardiovascular: regular rate and rhythm, no murmurs or gallops Respiratory: normal air entry, lungs clear to auscultation and no rales, rhonchi or wheezing Abdomen: no hernias and well healed incision Back: inspection of back is normal Extremities: extremities normal, atraumatic, no cyanosis or edema Skin exam - normal coloration and turgor, no rashes, no suspicious skin lesions noted. Neurological exam reveals alert, oriented, normal speech, no focal findings or movement disorder noted.  Pelvic: exam chaperoned by nurse, exam from last visit EGBUS within normal limits;   Vulva: normal appearing vulva with no masses, tenderness or lesions;  Vagina: normal vagina; Adnexa: normal adnexa in size, nontender and no masses;  Uterus: absent;  Cervix: absent;  Rectal: not indicated     Assessment:  Michelle Simpson is a 54 y.o. P74 female with recent episodes of vaginal bleeding.  No lesion seen and PAP normal. She had an US  and was found to have a 2.3 cm complex right ovarian cystic lesion with solid areas, complex, but no color flow. History of dx lap excision endometriosis, TLH BS 2005 for endometriosis, laparoscopic trachelectomy and left oophorectomy 2011 with Dr. Herb for endometriosis. Had cuff abscess post op. No hormonal therapy after that.    MRI exam 9/25 normal.  No evidence of ovarian cyst or other pathology.   Tumor markers 8/25: CA19-9 elevated to 56 Normal markers: CA-125 10.7, HE4 54.1, CEA 2.0 Inhibin A 1.1 and inhibin B <7,  ROMA value low risk for postmenopausal  FSH 81.5 PAP/HPV normal  Medical co-morbidities complicating care: SABRA   Plan:   Problem List Items Addressed This Visit       Endocrine   Right ovarian cyst - Primary    Discussed with patient that the source of vaginal bleeding  is unclear.  Could be due to atrophic vaginitis.  She is not sexually active. There is no lesion in the vagina, but there is some induration above the cuff, perhaps due to prior cuff abscess and history of endometriosis.   CA19-9 slightly elevated.  Discussed with Dr Jacobo.  Could be due to IBD, but she has not received indicated colon cancer screening or lung cancer CT screening.  Will arrange for these and see her back in 3 months with CA125 and CA 19-9.  The patient's diagnosis, an outline of the further diagnostic and laboratory studies which will be required, the recommendation, and alternatives were discussed.  All questions were answered to the patient's satisfaction.   Prentice Agent, MD  CC:  Dineen Channel, PA-C [address] [place],  [place] [address] [phone]

## 2024-09-12 ENCOUNTER — Telehealth: Payer: Self-pay

## 2024-09-12 ENCOUNTER — Other Ambulatory Visit: Payer: Self-pay

## 2024-09-12 DIAGNOSIS — Z1211 Encounter for screening for malignant neoplasm of colon: Secondary | ICD-10-CM

## 2024-09-12 MED ORDER — NA SULFATE-K SULFATE-MG SULF 17.5-3.13-1.6 GM/177ML PO SOLN: 1.0000 | Freq: Once | ORAL | 0 refills | Status: AC

## 2024-09-12 NOTE — Telephone Encounter (Signed)
 Gastroenterology Pre-Procedure Review  Request Date: 11/27/24 Requesting Physician: Dr. Melany  PATIENT REVIEW QUESTIONS: The patient responded to the following health history questions as indicated:    1. Are you having any GI issues? Ongoing IBS-D diagnosed at age 53.  Pt stated that she has learned to live with it by dietary adjustments.  Office visit offered but declined. 2. Do you have a personal history of Polyps? no 3. Do you have a family history of Colon Cancer or Polyps? no 4. Diabetes Mellitus? no 5. Joint replacements in the past 12 months?no 6. Major health problems in the past 3 months?no 7. Any artificial heart valves, MVP, or defibrillator?no    MEDICATIONS & ALLERGIES:    Patient reports the following regarding taking any anticoagulation/antiplatelet therapy:   Plavix, Coumadin, Eliquis, Xarelto, Lovenox, Pradaxa, Brilinta, or Effient? no Aspirin? no  Patient confirms/reports the following medications:  Current Outpatient Medications  Medication Sig Dispense Refill   albuterol  (VENTOLIN  HFA) 108 (90 Base) MCG/ACT inhaler Inhale 2 puffs into the lungs every 6 (six) hours as needed for wheezing or shortness of breath. 18 g 2   atorvastatin  (LIPITOR) 10 MG tablet Take 1 tablet (10 mg total) by mouth daily. 90 tablet 3   famotidine (PEPCID) 40 MG tablet Take 40 mg by mouth daily. Take one tablet by mouth in the evening     hydrALAZINE  (APRESOLINE ) 25 MG tablet Take 1 tablet (25 mg total) by mouth 3 (three) times daily. 270 tablet 1   hydrochlorothiazide  (HYDRODIURIL ) 25 MG tablet Take 1 tablet (25 mg total) by mouth daily. 90 tablet 1   losartan  (COZAAR ) 50 MG tablet TAKE 1 TABLET BY MOUTH TWICE DAILY 180 tablet 1   methocarbamol  (ROBAXIN ) 500 MG tablet Take 1 tablet (500 mg total) by mouth 4 (four) times daily. 40 tablet 2   naloxone (NARCAN) nasal spray 4 mg/0.1 mL  (Patient not taking: Reported on 08/30/2024)     oxyCODONE -acetaminophen  (PERCOCET/ROXICET) 5-325 MG  tablet Take 1 tablet by mouth every 6 (six) hours as needed. Take one tablet 5x daily     semaglutide -weight management (WEGOVY ) 0.25 MG/0.5ML SOAJ SQ injection Inject 0.25 mg into the skin once a week. 2 mL 1   Vitamin D, Ergocalciferol, (DRISDOL) 1.25 MG (50000 UNIT) CAPS capsule Take 50,000 Units by mouth once a week.     No current facility-administered medications for this visit.    Patient confirms/reports the following allergies:  Allergies  Allergen Reactions   Atenolol Hives    Urticaria.   Gabapentin Swelling   Nortriptyline Swelling   Amlodipine Swelling   Ibuprofen Other (See Comments)    With prolonged use swelling, HTN and congestion.   Latex Hives    Urticaria.   Meloxicam     Other reaction(s): Other (See Comments) Upset stomach.   Metoprolol Hives   Nsaids Swelling   Paxil Cr [Paroxetine Hcl Er] Other (See Comments)    hallucination    No orders of the defined types were placed in this encounter.   AUTHORIZATION INFORMATION Primary Insurance: 1D#: Group #:  Secondary Insurance: 1D#: Group #:  SCHEDULE INFORMATION: Date: 11/27/24 Time: Location: [facility]

## 2024-09-18 DIAGNOSIS — E669 Obesity, unspecified: Secondary | ICD-10-CM | POA: Diagnosis not present

## 2024-09-18 DIAGNOSIS — E559 Vitamin D deficiency, unspecified: Secondary | ICD-10-CM | POA: Diagnosis not present

## 2024-09-18 DIAGNOSIS — Z79899 Other long term (current) drug therapy: Secondary | ICD-10-CM | POA: Diagnosis not present

## 2024-09-18 DIAGNOSIS — G8929 Other chronic pain: Secondary | ICD-10-CM | POA: Diagnosis not present

## 2024-09-18 DIAGNOSIS — M25561 Pain in right knee: Secondary | ICD-10-CM | POA: Diagnosis not present

## 2024-09-18 DIAGNOSIS — F17210 Nicotine dependence, cigarettes, uncomplicated: Secondary | ICD-10-CM | POA: Diagnosis not present

## 2024-09-18 DIAGNOSIS — I10 Essential (primary) hypertension: Secondary | ICD-10-CM | POA: Diagnosis not present

## 2024-09-18 DIAGNOSIS — M25562 Pain in left knee: Secondary | ICD-10-CM | POA: Diagnosis not present

## 2024-09-18 DIAGNOSIS — M25571 Pain in right ankle and joints of right foot: Secondary | ICD-10-CM | POA: Diagnosis not present

## 2024-09-18 DIAGNOSIS — M549 Dorsalgia, unspecified: Secondary | ICD-10-CM | POA: Diagnosis not present

## 2024-09-20 DIAGNOSIS — Z79899 Other long term (current) drug therapy: Secondary | ICD-10-CM | POA: Diagnosis not present

## 2024-09-21 ENCOUNTER — Other Ambulatory Visit (HOSPITAL_COMMUNITY): Payer: Self-pay

## 2024-09-21 ENCOUNTER — Telehealth: Payer: Self-pay

## 2024-09-21 NOTE — Telephone Encounter (Signed)
 Pharmacy Patient Advocate Encounter   Received notification from Onbase that prior authorization for Wegovy  0.25 mg/0.5 ml auto injectors is required/requested.   Insurance verification completed.   The patient is insured through [employer].   Per test claim: Effective October 1st, Medicaid discontinued coverage of GLP1 medications for weight loss (such as Wegovy  and Zepbound), unless the patient has a documented history of a heart attack or stroke. Zepbound will continue to be covered only for patients with moderate to severe sleep apnea (AHI 15-30) and a BMI greater than 40. Because of this change, the prior authorization team will not be submitting new PA requests for GLP1 medications prescribed for weight loss, as patients will be unable to continue therapy under Medicaid coverage.

## 2024-09-25 ENCOUNTER — Other Ambulatory Visit (HOSPITAL_COMMUNITY): Payer: Self-pay

## 2024-10-16 DIAGNOSIS — E559 Vitamin D deficiency, unspecified: Secondary | ICD-10-CM | POA: Diagnosis not present

## 2024-10-16 DIAGNOSIS — F17210 Nicotine dependence, cigarettes, uncomplicated: Secondary | ICD-10-CM | POA: Diagnosis not present

## 2024-10-16 DIAGNOSIS — M25571 Pain in right ankle and joints of right foot: Secondary | ICD-10-CM | POA: Diagnosis not present

## 2024-10-16 DIAGNOSIS — M25562 Pain in left knee: Secondary | ICD-10-CM | POA: Diagnosis not present

## 2024-10-16 DIAGNOSIS — E669 Obesity, unspecified: Secondary | ICD-10-CM | POA: Diagnosis not present

## 2024-10-16 DIAGNOSIS — M549 Dorsalgia, unspecified: Secondary | ICD-10-CM | POA: Diagnosis not present

## 2024-10-16 DIAGNOSIS — Z79899 Other long term (current) drug therapy: Secondary | ICD-10-CM | POA: Diagnosis not present

## 2024-10-16 DIAGNOSIS — M25561 Pain in right knee: Secondary | ICD-10-CM | POA: Diagnosis not present

## 2024-10-16 DIAGNOSIS — I10 Essential (primary) hypertension: Secondary | ICD-10-CM | POA: Diagnosis not present

## 2024-10-18 DIAGNOSIS — Z79899 Other long term (current) drug therapy: Secondary | ICD-10-CM | POA: Diagnosis not present

## 2024-11-23 ENCOUNTER — Encounter: Payer: Self-pay | Admitting: *Deleted

## 2024-11-23 ENCOUNTER — Encounter: Payer: Self-pay | Admitting: Gastroenterology

## 2024-11-24 ENCOUNTER — Ambulatory Visit: Admitting: Physician Assistant

## 2024-11-24 VITALS — BP 183/102 | HR 68 | Resp 16 | Ht 67.0 in | Wt 228.1 lb

## 2024-11-24 DIAGNOSIS — Z0001 Encounter for general adult medical examination with abnormal findings: Secondary | ICD-10-CM | POA: Diagnosis not present

## 2024-11-24 DIAGNOSIS — R29818 Other symptoms and signs involving the nervous system: Secondary | ICD-10-CM

## 2024-11-24 DIAGNOSIS — E66812 Obesity, class 2: Secondary | ICD-10-CM | POA: Diagnosis not present

## 2024-11-24 DIAGNOSIS — I10 Essential (primary) hypertension: Secondary | ICD-10-CM | POA: Diagnosis not present

## 2024-11-24 DIAGNOSIS — M170 Bilateral primary osteoarthritis of knee: Secondary | ICD-10-CM

## 2024-11-24 DIAGNOSIS — R0683 Snoring: Secondary | ICD-10-CM | POA: Diagnosis not present

## 2024-11-24 DIAGNOSIS — G894 Chronic pain syndrome: Secondary | ICD-10-CM | POA: Diagnosis not present

## 2024-11-24 DIAGNOSIS — Z716 Tobacco abuse counseling: Secondary | ICD-10-CM | POA: Diagnosis not present

## 2024-11-24 DIAGNOSIS — E669 Obesity, unspecified: Secondary | ICD-10-CM

## 2024-11-24 DIAGNOSIS — Z86718 Personal history of other venous thrombosis and embolism: Secondary | ICD-10-CM

## 2024-11-24 DIAGNOSIS — E782 Mixed hyperlipidemia: Secondary | ICD-10-CM

## 2024-11-24 DIAGNOSIS — Z0000 Encounter for general adult medical examination without abnormal findings: Secondary | ICD-10-CM

## 2024-11-24 NOTE — Progress Notes (Unsigned)
 "    Complete physical exam  [name]   DOB: 1971/02/24   54 y.o. Female  MRN: [id number] Visit Date: 11/24/2024  Today's healthcare provider: Jolynn Spencer, PA-C   Chief Complaint  Patient presents with   Annual Exam    Patient here for annual physical Exam. Diet: Regular Exercise: None Feeling:Fairly Well Sleeping:Fairly Well-has trouble falling asleep Concerns today: Would like to see another alternative for weight loss. Patient has gained 11 lbs. Reports insurance not covering [employer]  anymore.  Health Maintenance: Colonoscopy: Scheduled 11/27/24 Immunizations: Declined pneumococcal, Shingles and Tdap Vaccine    Subjective    Michelle Simpson is a 54 y.o. female who presents today for a complete physical exam.  She reports consuming a {diet types:17450} diet. {Exercise:19826} She generally feels {well/fairly well/poorly:18703}. She reports sleeping {well/fairly well/poorly:18703}. She {does/does not:200015} have additional problems to discuss today.  HPI HPI     Annual Exam    Additional comments: Patient here for annual physical Exam. Diet: Regular Exercise: None Feeling:Fairly Well Sleeping:Fairly Well-has trouble falling asleep Concerns today: Would like to see another alternative for weight loss. Patient has gained 11 lbs. Reports insurance not covering [employer]  anymore.  Health Maintenance: Colonoscopy: Scheduled 11/27/24 Immunizations: Declined pneumococcal, Shingles and Tdap Vaccine       Last edited by Rosas, Joseline E, CMA on 11/24/2024  3:49 PM.      *** Discussed the use of AI scribe software for clinical note transcription with the patient, who gave verbal consent to proceed.  History of Present Illness     Last depression screening scores    08/30/2024    9:39 AM 07/19/2024    1:53 PM 05/17/2024    4:35 PM  PHQ 2/9 Scores  PHQ - 2 Score 0 0 0  PHQ- 9 Score   2      Data saved with a previous flowsheet row definition   Last fall  risk screening    11/18/2023    3:49 PM  Fall Risk   Falls in the past year? 1  Number falls in past yr: 1  Injury with Fall? 1   Risk for fall due to : History of fall(s);Impaired balance/gait  Follow up Falls prevention discussed     Data saved with a previous flowsheet row definition   Last Audit-C alcohol use screening    11/24/2024    3:35 PM  Alcohol Use Disorder Test (AUDIT)  1. How often do you have a drink containing alcohol? 1   2. How many drinks containing alcohol do you have on a typical day when you are drinking? 0   3. How often do you have six or more drinks on one occasion? 0   AUDIT-C Score 1      Manually entered by patient   A score of 3 or more in women, and 4 or more in men indicates increased risk for alcohol abuse, EXCEPT if all of the points are from question 1   Past Medical History:  Diagnosis Date   B12 deficiency    Degenerative disc disease, lumbar    Depression    DJD (degenerative joint disease)    Eczema    Edema    Endometriosis    Family history of adverse reaction to anesthesia    mother has PONV   GERD (gastroesophageal reflux disease)    High mean corpuscular hemoglobin concentration (MCHC)    History of DVT (deep vein thrombosis)    History of  kidney stones    Hypertension    IBS (irritable bowel syndrome)    Neuromuscular disorder (HCC)    neuropathy with DDD   Osteoarthritis of right hip    Panic attack    Pernicious anemia    PONV (postoperative nausea and vomiting)    Past Surgical History:  Procedure Laterality Date   ABDOMINAL HYSTERECTOMY     ANKLE SURGERY Right    ANKLE SURGERY Right    plate placed   Bilateral knee RFA nerve ablation @ [facility]     CHOLECYSTECTOMY     KNEE SURGERY Left 2012   LACRIMAL DUCT RECONSTRUCTION     LAPAROSCOPY     miscarriage D&C  1995   TYMPANOSTOMY TUBE PLACEMENT  1977   Social History   Socioeconomic History   Marital status: Divorced     Spouse name: Not on file   Number of children: 1   Years of education: Not on file   Highest education level: Bachelor's degree (e.g., BA, AB, BS)  Occupational History   Not on file  Tobacco Use   Smoking status: Every Day    Current packs/day: 1.00    Average packs/day: 1 pack/day for 15.0 years (15.0 ttl pk-yrs)    Types: Cigarettes    Start date: 11/16/2009   Smokeless tobacco: Never   Tobacco comments:    1 ppd - reported 04/29/2021  Vaping Use   Vaping status: Former   Start date: 11/17/2015   Quit date: 01/07/2020   Substances: Nicotine, Flavoring  Substance and Sexual Activity   Alcohol use: Yes    Comment: occaisionally   Drug use: No   Sexual activity: Never    Birth control/protection: None  Other Topics Concern   Not on file  Social History Narrative   Not on file   Social Drivers of Health   Tobacco Use: High Risk (11/24/2024)   Patient History    Smoking Tobacco Use: Every Day    Smokeless Tobacco Use: Never    Passive Exposure: Not on file  Financial Resource Strain: Low Risk (11/24/2024)   Overall Financial Resource Strain (CARDIA)    Difficulty of Paying Living Expenses: Not very hard  Food Insecurity: No Food Insecurity (11/24/2024)   Epic    Worried About Programme Researcher, Broadcasting/film/video in the Last Year: Never true    Ran Out of Food in the Last Year: Never true  Transportation Needs: No Transportation Needs (11/24/2024)   Epic    Lack of Transportation (Medical): No    Lack of Transportation (Non-Medical): No  Physical Activity: Inactive (05/17/2024)   Exercise Vital Sign    Days of Exercise per Week: 0 days    Minutes of Exercise per Session: Not on file  Stress: No Stress Concern Present (11/24/2024)   [employer] of Occupational Health - Occupational Stress Questionnaire    Feeling of Stress: Not at all  Social Connections: Moderately Integrated (11/24/2024)   Social Connection and Isolation Panel    Frequency of Communication with  Friends and Family: Three times a week    Frequency of Social Gatherings with Friends and Family: Twice a week    Attends Religious Services: 1 to 4 times per year    Active Member of [employer] or Organizations: Yes    Attends Banker Meetings: More than 4 times per year    Marital Status: Widowed  Intimate Partner Violence: Not At Risk (07/19/2024)   Epic    Fear of Current  or Ex-Partner: No    Emotionally Abused: No    Physically Abused: No    Sexually Abused: No  Depression (PHQ2-9): Low Risk (08/30/2024)   Depression (PHQ2-9)    PHQ-2 Score: 0  Alcohol Screen: Low Risk (11/24/2024)   Alcohol Screen    Last Alcohol Screening Score (AUDIT): 1  Housing: Low Risk (11/24/2024)   Epic    Unable to Pay for Housing in the Last Year: No    Number of Times Moved in the Last Year: 0    Homeless in the Last Year: No  Utilities: Not At Risk (07/19/2024)   Epic    Threatened with loss of utilities: No  Health Literacy: Not on file   Family Status  Relation Name Status   Father  Deceased at age 10       MI   Mother  Deceased   MGM  (Not Specified)   PGM  (Not Specified)   Brother  Nature Conservation Officer  No partnership data on file   Family History  Problem Relation Age of Onset   Hypertension Father    Hypertension Mother    Deep vein thrombosis Mother    Narcolepsy Mother    Anxiety disorder Mother    Hypertension Maternal Grandmother    Hypertension Paternal Grandmother    Hypertension Brother    Heart attack Brother    Liver disease Brother    Allergies[1]  Patient Care Team: Loop, Jermond Burkemper, PA-C as PCP - General (Physician Assistant) Darliss Rogue, MD as PCP - Cardiology (Cardiology) Flinchum, Rosaline RAMAN, FNP as Referring Physician (Family Medicine)   Medications: Show/hide medication list[2]  Review of Systems Except see HPI  {Insert previous labs (optional):23779} {See past labs  Heme  Chem  Endocrine  Serology   Results Review (optional):1}  Objective    BP (!) 168/102 (BP Location: Right Arm, Patient Position: Sitting, Cuff Size: Large)   Pulse 68   Resp 16   Ht 5' 7 (1.702 m)   Wt 228 lb 1.6 oz (103.5 kg)   SpO2 97%   BMI 35.73 kg/m  {Insert last BP/Wt (optional):23777}{See vitals history (optional):1}    Physical Exam   No results found for any visits on 11/24/24.  Assessment & Plan    Routine Health Maintenance and Physical Exam  Exercise Activities and Dietary recommendations  Goals   None     Immunization History  Administered Date(s) Administered   Influenza, Mdck, Trivalent,PF 6+ MOS(egg free) 08/11/2023   Influenza,inj,Quad PF,6+ Mos 09/22/2021   Influenza-Unspecified 11/17/2016, 08/18/2024   MMR 03/08/2003   Moderna Covid-19 Fall Seasonal Vaccine 22yrs & older 07/28/2021   Moderna SARS-COV2 Booster Vaccination 11/22/2020   Moderna Sars-Covid-2 Vaccination 02/20/2020, 03/12/2020   Td 03/08/2003    Health Maintenance  Topic Date Due   HIV Screening  Never done   Hepatitis C Screening  Never done   Pneumococcal Vaccine for age over 63 (1 of 2 - PCV) Never done   Hepatitis B Vaccine (1 of 3 - 19+ 3-dose series) Never done   Zoster (Shingles) Vaccine (1 of 2) Never done   DTaP/Tdap/Td vaccine (2 - Tdap) 03/07/2013   Colon Cancer Screening  Never done   COVID-19 Vaccine (4 - 2025-26 season) 07/17/2024   Breast Cancer Screening  06/30/2025   Flu Shot  Completed   HPV Vaccine  Aged Out   Meningitis B Vaccine  Aged Out    Discussed health benefits of physical activity, and encouraged  her to engage in regular exercise appropriate for her age and condition.  Assessment and Plan Assessment & Plan      ***  No follow-ups on file.    The patient was advised to call back or seek an in-person evaluation if the symptoms worsen or if the condition fails to improve as anticipated.  I discussed the assessment and treatment plan with the  patient. The patient was provided an opportunity to ask questions and all were answered. The patient agreed with the plan and demonstrated an understanding of the instructions.  I, Rosemae Mcquown, PA-C have reviewed all documentation for this visit. The documentation on 11/24/2024  for the exam, diagnosis, procedures, and orders are all accurate and complete.  Jolynn Spencer, [facility], MMS [facility] [phone] (phone) [phone] (fax)  [facility]      [1] Allergies Allergen Reactions   Atenolol Hives    Urticaria.   Gabapentin Swelling   Nortriptyline Swelling   Amlodipine Swelling   Ibuprofen Other (See Comments)    With prolonged use swelling, HTN and congestion.   Latex Hives    Urticaria.   Meloxicam     Other reaction(s): Other (See Comments) Upset stomach.   Metoprolol Hives   Nsaids Swelling   Paxil Cr [Paroxetine Hcl Er] Other (See Comments)    hallucination  [2] Outpatient Medications Prior to Visit  Medication Sig   albuterol  (VENTOLIN  HFA) 108 (90 Base) MCG/ACT inhaler Inhale 2 puffs into the lungs every 6 (six) hours as needed for wheezing or shortness of breath.   atorvastatin  (LIPITOR) 10 MG tablet Take 1 tablet (10 mg total) by mouth daily.   famotidine (PEPCID) 40 MG tablet Take 40 mg by mouth daily. Take one tablet by mouth in the evening   hydrALAZINE  (APRESOLINE ) 25 MG tablet Take 1 tablet (25 mg total) by mouth 3 (three) times daily.   hydrochlorothiazide  (HYDRODIURIL ) 25 MG tablet Take 1 tablet (25 mg total) by mouth daily.   losartan  (COZAAR ) 50 MG tablet TAKE 1 TABLET BY MOUTH TWICE DAILY   methocarbamol  (ROBAXIN ) 500 MG tablet Take 1 tablet (500 mg total) by mouth 4 (four) times daily.   naloxone (NARCAN) nasal spray 4 mg/0.1 mL    oxyCODONE -acetaminophen  (PERCOCET/ROXICET) 5-325 MG tablet Take 1 tablet by mouth every 4 (four) hours as needed. Take one tablet 6x daily   Vitamin D , Ergocalciferol ,  (DRISDOL) 1.25 MG (50000 UNIT) CAPS capsule Take 50,000 Units by mouth once a week.   No facility-administered medications prior to visit.  "

## 2024-11-26 DIAGNOSIS — Z1211 Encounter for screening for malignant neoplasm of colon: Secondary | ICD-10-CM | POA: Insufficient documentation

## 2024-11-26 DIAGNOSIS — Z0000 Encounter for general adult medical examination without abnormal findings: Secondary | ICD-10-CM | POA: Insufficient documentation

## 2024-11-26 DIAGNOSIS — I10 Essential (primary) hypertension: Secondary | ICD-10-CM | POA: Insufficient documentation

## 2024-11-27 ENCOUNTER — Encounter: Admission: RE | Disposition: A | Payer: Self-pay | Source: Home / Self Care | Attending: Gastroenterology

## 2024-11-27 ENCOUNTER — Ambulatory Visit: Payer: Self-pay

## 2024-11-27 ENCOUNTER — Ambulatory Visit
Admission: RE | Admit: 2024-11-27 | Discharge: 2024-11-27 | Disposition: A | Attending: Gastroenterology | Admitting: Gastroenterology

## 2024-11-27 ENCOUNTER — Encounter: Payer: Self-pay | Admitting: Gastroenterology

## 2024-11-27 ENCOUNTER — Other Ambulatory Visit: Payer: Self-pay

## 2024-11-27 DIAGNOSIS — J45909 Unspecified asthma, uncomplicated: Secondary | ICD-10-CM | POA: Insufficient documentation

## 2024-11-27 DIAGNOSIS — Z1211 Encounter for screening for malignant neoplasm of colon: Secondary | ICD-10-CM | POA: Insufficient documentation

## 2024-11-27 DIAGNOSIS — F17210 Nicotine dependence, cigarettes, uncomplicated: Secondary | ICD-10-CM | POA: Diagnosis not present

## 2024-11-27 DIAGNOSIS — Z79899 Other long term (current) drug therapy: Secondary | ICD-10-CM | POA: Insufficient documentation

## 2024-11-27 DIAGNOSIS — K644 Residual hemorrhoidal skin tags: Secondary | ICD-10-CM | POA: Insufficient documentation

## 2024-11-27 DIAGNOSIS — I10 Essential (primary) hypertension: Secondary | ICD-10-CM | POA: Diagnosis not present

## 2024-11-27 DIAGNOSIS — Z8249 Family history of ischemic heart disease and other diseases of the circulatory system: Secondary | ICD-10-CM | POA: Diagnosis not present

## 2024-11-27 DIAGNOSIS — K219 Gastro-esophageal reflux disease without esophagitis: Secondary | ICD-10-CM | POA: Diagnosis not present

## 2024-11-27 DIAGNOSIS — K640 First degree hemorrhoids: Secondary | ICD-10-CM | POA: Diagnosis not present

## 2024-11-27 DIAGNOSIS — Z86718 Personal history of other venous thrombosis and embolism: Secondary | ICD-10-CM | POA: Insufficient documentation

## 2024-11-27 HISTORY — DX: Personal history of urinary calculi: Z87.442

## 2024-11-27 HISTORY — PX: COLONOSCOPY: SHX5424

## 2024-11-27 HISTORY — DX: Family history of other specified conditions: Z84.89

## 2024-11-27 HISTORY — DX: Myoneural disorder, unspecified: G70.9

## 2024-11-27 HISTORY — DX: Gastro-esophageal reflux disease without esophagitis: K21.9

## 2024-11-27 HISTORY — DX: Nausea with vomiting, unspecified: R11.2

## 2024-11-27 HISTORY — DX: Other specified postprocedural states: Z98.890

## 2024-11-27 SURGERY — COLONOSCOPY
Anesthesia: General | Site: Rectum

## 2024-11-27 MED ORDER — PROPOFOL 10 MG/ML IV BOLUS
INTRAVENOUS | Status: DC | PRN
  Administered 2024-11-27 (×2): 30 mg via INTRAVENOUS
  Administered 2024-11-27 (×2): 20 mg via INTRAVENOUS
  Administered 2024-11-27: 30 mg via INTRAVENOUS
  Administered 2024-11-27: 100 mg via INTRAVENOUS

## 2024-11-27 MED ORDER — LIDOCAINE HCL (CARDIAC) PF 100 MG/5ML IV SOSY
PREFILLED_SYRINGE | INTRAVENOUS | Status: DC | PRN
  Administered 2024-11-27: 50 mg via INTRAVENOUS

## 2024-11-27 MED ORDER — SODIUM CHLORIDE 0.9 % IV SOLN: INTRAVENOUS | Status: DC

## 2024-11-27 MED ORDER — LACTATED RINGERS IV SOLN: INTRAVENOUS | Status: DC

## 2024-11-27 MED ORDER — GLYCOPYRROLATE 0.2 MG/ML IJ SOLN
INTRAMUSCULAR | Status: DC | PRN
  Administered 2024-11-27 (×2): .1 mg via INTRAVENOUS

## 2024-11-27 MED ORDER — STERILE WATER FOR IRRIGATION IR SOLN
Status: DC | PRN
  Administered 2024-11-27: 1

## 2024-11-27 MED ORDER — PROPOFOL 10 MG/ML IV BOLUS
INTRAVENOUS | Status: AC
  Filled 2024-11-27: qty 20

## 2024-11-27 MED ORDER — LIDOCAINE HCL (PF) 2 % IJ SOLN
INTRAMUSCULAR | Status: AC
  Filled 2024-11-27: qty 5

## 2024-11-27 SURGICAL SUPPLY — 4 items
GOWN CVR UNV OPN BCK APRN NK (MISCELLANEOUS) ×2 IMPLANT
KIT PROCEDURE OLYMPUS (MISCELLANEOUS) ×1 IMPLANT
MANIFOLD NEPTUNE II (INSTRUMENTS) ×1 IMPLANT
WATER STERILE IRR 250ML POUR (IV SOLUTION) ×1 IMPLANT

## 2024-11-27 NOTE — Anesthesia Postprocedure Evaluation (Signed)
"   Anesthesia Post Note  [name]  Procedure(s) Performed: COLONOSCOPY (Rectum)  Patient location during evaluation: PACU Anesthesia Type: General Level of consciousness: awake and alert Pain management: pain level controlled Vital Signs Assessment: post-procedure vital signs reviewed and stable Respiratory status: spontaneous breathing, nonlabored ventilation, respiratory function stable and patient connected to nasal cannula oxygen Cardiovascular status: blood pressure returned to baseline and stable Postop Assessment: no apparent nausea or vomiting Anesthetic complications: no   No notable events documented.   Last Vitals:  Vitals:   11/27/24 1120 11/27/24 1130  BP: (!) 146/91 (!) 162/82  Pulse: 72 62  Resp: 17 18  Temp: (!) 36.4 C   SpO2: 95% 100%    Last Pain:  Vitals:   11/27/24 1120  TempSrc:   PainSc: 0-No pain                 Prentice Murphy      "

## 2024-11-27 NOTE — Transfer of Care (Signed)
 Immediate Anesthesia Transfer of Care Note  [name]  Procedure(s) Performed: COLONOSCOPY (Rectum)  Patient Location: PACU  Anesthesia Type: General  Level of Consciousness: awake, alert  and patient cooperative  Airway and Oxygen Therapy: Patient Spontanous Breathing and Patient connected to supplemental oxygen  Post-op Assessment: Post-op Vital signs reviewed, Patient's Cardiovascular Status Stable, Respiratory Function Stable, Patent Airway and No signs of Nausea or vomiting  Post-op Vital Signs: Reviewed and stable  Complications: No notable events documented.

## 2024-11-27 NOTE — H&P (Signed)
 "  Clotilda Schaffer, MD  [address], [address] [place], [place] [address] Phone: [phone] Fax : [phone]  Primary Care Physician:  Dineen Channel, PA-C Primary Gastroenterologist:  Dr. Schaffer  Pre-Procedure History & Physical: HPI:  Michelle Simpson is a 54 y.o. female is here for a screening colonoscopy.  Prior colonoscopy? 2012 at [facility], looking for endometrial implants in her colon. Fhx CRC? No Blood thinners? No  Past Medical History:  Diagnosis Date   B12 deficiency    Degenerative disc disease, lumbar    Depression    DJD (degenerative joint disease)    Eczema    Edema    Endometriosis    Family history of adverse reaction to anesthesia    mother has PONV   GERD (gastroesophageal reflux disease)    High mean corpuscular hemoglobin concentration (MCHC)    History of DVT (deep vein thrombosis)    History of kidney stones    Hypertension    IBS (irritable bowel syndrome)    Neuromuscular disorder (HCC)    neuropathy with DDD   Osteoarthritis of right hip    Panic attack    Pernicious anemia    PONV (postoperative nausea and vomiting)     Past Surgical History:  Procedure Laterality Date   ABDOMINAL HYSTERECTOMY     ANKLE SURGERY Right    ANKLE SURGERY Right    plate placed   Bilateral knee RFA nerve ablation @ [facility]     CHOLECYSTECTOMY     KNEE SURGERY Left 2012   LACRIMAL DUCT RECONSTRUCTION     LAPAROSCOPY     miscarriage D&C  1995   TYMPANOSTOMY TUBE PLACEMENT  1977    Prior to Admission medications  Medication Sig Start Date End Date Taking? Authorizing Provider  atorvastatin  (LIPITOR) 10 MG tablet Take 1 tablet (10 mg total) by mouth daily. 05/17/24  Yes Ostwalt, Janna, PA-C  famotidine (PEPCID) 40 MG tablet Take 40 mg by mouth daily. Take one tablet by mouth in the evening   Yes [provider]  hydrALAZINE  (APRESOLINE ) 25 MG tablet Take 1 tablet (25 mg total) by mouth 3 (three) times daily. 05/17/24  Yes Ostwalt, Janna, PA-C   hydrochlorothiazide  (HYDRODIURIL ) 25 MG tablet Take 1 tablet (25 mg total) by mouth daily. 05/17/24  Yes Ostwalt, Janna, PA-C  losartan  (COZAAR ) 50 MG tablet TAKE 1 TABLET BY MOUTH TWICE DAILY 05/17/24  Yes Ostwalt, Janna, PA-C  methocarbamol  (ROBAXIN ) 500 MG tablet Take 1 tablet (500 mg total) by mouth 4 (four) times daily. 05/29/21  Yes Chrismon, Marinda BRAVO, PA-C  oxyCODONE -acetaminophen  (PERCOCET/ROXICET) 5-325 MG tablet Take 1 tablet by mouth every 4 (four) hours as needed. Take one tablet 6x daily 02/22/23  Yes [provider]  Vitamin D , Ergocalciferol , (DRISDOL) 1.25 MG (50000 UNIT) CAPS capsule Take 50,000 Units by mouth once a week. 02/10/23  Yes [provider]  albuterol  (VENTOLIN  HFA) 108 (90 Base) MCG/ACT inhaler Inhale 2 puffs into the lungs every 6 (six) hours as needed for wheezing or shortness of breath. 11/19/23   Clifton, Kellie A, FNP  naloxone [facility]) nasal spray 4 mg/0.1 mL  02/02/24   [provider]    Allergies as of 09/12/2024 - Review Complete 09/12/2024  Allergen Reaction Noted   Atenolol Hives 03/18/2016   Gabapentin Swelling 03/18/2016   Nortriptyline Swelling 03/02/2013   Amlodipine Swelling 05/10/2015   Ibuprofen Other (See Comments) 03/18/2016   Latex Hives 06/18/2014   Meloxicam  03/18/2016   Metoprolol Hives  05/10/2015   Nsaids Swelling 05/10/2015   Paxil cr [paroxetine hcl er] Other (See Comments) 05/10/2015    Family History  Problem Relation Age of Onset   Hypertension Father    Hypertension Mother    Deep vein thrombosis Mother    Narcolepsy Mother    Anxiety disorder Mother    Hypertension Maternal Grandmother    Hypertension Paternal Grandmother    Hypertension Brother    Heart attack Brother    Liver disease Brother     Social History   Socioeconomic History   Marital status: Divorced    Spouse name: Not on file   Number of children: 1   Years of education: Not on file   Highest education level: Bachelor's degree  (e.g., BA, AB, BS)  Occupational History   Not on file  Tobacco Use   Smoking status: Every Day    Current packs/day: 1.00    Average packs/day: 1 pack/day for 15.0 years (15.0 ttl pk-yrs)    Types: Cigarettes    Start date: 11/16/2009   Smokeless tobacco: Never   Tobacco comments:    1 ppd - reported 04/29/2021  Vaping Use   Vaping status: Former   Start date: 11/17/2015   Quit date: 01/07/2020   Substances: Nicotine, Flavoring  Substance and Sexual Activity   Alcohol use: Yes    Comment: occaisionally   Drug use: No   Sexual activity: Never    Birth control/protection: None  Other Topics Concern   Not on file  Social History Narrative   Not on file   Social Drivers of Health   Tobacco Use: High Risk (11/27/2024)   Patient History    Smoking Tobacco Use: Every Day    Smokeless Tobacco Use: Never    Passive Exposure: Not on file  Financial Resource Strain: Low Risk (11/24/2024)   Overall Financial Resource Strain (CARDIA)    Difficulty of Paying Living Expenses: Not very hard  Food Insecurity: No Food Insecurity (11/24/2024)   Epic    Worried About Programme Researcher, Broadcasting/film/video in the Last Year: Never true    Ran Out of Food in the Last Year: Never true  Transportation Needs: No Transportation Needs (11/24/2024)   Epic    Lack of Transportation (Medical): No    Lack of Transportation (Non-Medical): No  Physical Activity: Inactive (05/17/2024)   Exercise Vital Sign    Days of Exercise per Week: 0 days    Minutes of Exercise per Session: Not on file  Stress: No Stress Concern Present (11/24/2024)   [employer] of Occupational Health - Occupational Stress Questionnaire    Feeling of Stress: Not at all  Social Connections: Moderately Integrated (11/24/2024)   Social Connection and Isolation Panel    Frequency of Communication with Friends and Family: Three times a week    Frequency of Social Gatherings with Friends and Family: Twice a week    Attends Religious Services: 1 to 4 times  per year    Active Member of [employer] or Organizations: Yes    Attends Banker Meetings: More than 4 times per year    Marital Status: Widowed  Intimate Partner Violence: Not At Risk (07/19/2024)   Epic    Fear of Current or Ex-Partner: No    Emotionally Abused: No    Physically Abused: No    Sexually Abused: No  Depression (PHQ2-9): Low Risk (08/30/2024)   Depression (PHQ2-9)    PHQ-2 Score: 0  Alcohol Screen: Low Risk (11/24/2024)  Alcohol Screen    Last Alcohol Screening Score (AUDIT): 1  Housing: Low Risk (11/24/2024)   Epic    Unable to Pay for Housing in the Last Year: No    Number of Times Moved in the Last Year: 0    Homeless in the Last Year: No  Utilities: Not At Risk (07/19/2024)   Epic    Threatened with loss of utilities: No  Health Literacy: Not on file    Review of Systems: See HPI, otherwise negative ROS  Physical Exam: BP (!) 167/97   Pulse 65   Temp 98.3 F (36.8 C) (Temporal)   Resp 11   Ht 5' 7.01 (1.702 m)   Wt 100.1 kg   SpO2 95%   BMI 34.56 kg/m  CONSTITUTIONAL: Well-appearing in no acute distress.  HEENT: Pupils equal, round, Extraocular movements intact. Conjunctivae clear NECK: Neck supple CARDIOVASCULAR: Regular rate, no LE edema  RESPIRATORY: No labored breathing  ABDOMEN: Abdomen soft, nontender, not distended, no guarding, no rigidity SKIN: No apparent skin rashes or lesions. NEUROLOGIC: Normal speech, no focal findings. Mental status alert and oriented x4. PSYCHIATRIC: Mood and affect normal.   Impression/Plan: Michelle Simpson is now here to undergo a screening colonoscopy.  Risks, benefits, and alternatives regarding colonoscopy have been reviewed with the patient.  Questions have been answered.  All parties agreeable.  "

## 2024-11-27 NOTE — Anesthesia Preprocedure Evaluation (Signed)
"                                    Anesthesia Evaluation  Patient identified by MRN, date of birth, ID band Patient awake    Reviewed: Allergy & Precautions, H&P , NPO status , Patient's Chart, lab work & pertinent test results, reviewed documented beta blocker date and time   History of Anesthesia Complications (+) PONV, Family history of anesthesia reaction and history of anesthetic complications  Airway Mallampati: III  TM Distance: >3 FB Neck ROM: full    Dental  (+) Upper Dentures, Lower Dentures, Dental Advidsory Given   Pulmonary neg shortness of breath, asthma , neg sleep apnea, neg COPD, neg recent URI, Current Smoker and Patient abstained from smoking.   Pulmonary exam normal breath sounds clear to auscultation       Cardiovascular Exercise Tolerance: Good hypertension, (-) angina + DVT  (-) Past MI and (-) Cardiac Stents Normal cardiovascular exam(-) dysrhythmias (-) Valvular Problems/Murmurs Rhythm:regular Rate:Normal     Neuro/Psych  PSYCHIATRIC DISORDERS Anxiety Depression    negative neurological ROS     GI/Hepatic Neg liver ROS,GERD  Medicated and Controlled,,  Endo/Other  negative endocrine ROS    Renal/GU negative Renal ROS  negative genitourinary   Musculoskeletal   Abdominal   Peds  Hematology negative hematology ROS (+)   Anesthesia Other Findings Past Medical History: No date: B12 deficiency No date: Degenerative disc disease, lumbar No date: Depression No date: DJD (degenerative joint disease) No date: Eczema No date: Edema No date: Endometriosis No date: Family history of adverse reaction to anesthesia     Comment:  mother has PONV No date: GERD (gastroesophageal reflux disease) No date: High mean corpuscular hemoglobin concentration (MCHC) No date: History of DVT (deep vein thrombosis) No date: History of kidney stones No date: Hypertension No date: IBS (irritable bowel syndrome) No date: Neuromuscular disorder  (HCC)     Comment:  neuropathy with DDD No date: Osteoarthritis of right hip No date: Panic attack No date: Pernicious anemia No date: PONV (postoperative nausea and vomiting)   Reproductive/Obstetrics negative OB ROS                              Anesthesia Physical Anesthesia Plan  ASA: 3  Anesthesia Plan: General   Post-op Pain Management:    Induction: Intravenous  PONV Risk Score and Plan: 3 and Propofol  infusion, TIVA and Treatment may vary due to age or medical condition  Airway Management Planned: Natural Airway and Nasal Cannula  Additional Equipment:   Intra-op Plan:   Post-operative Plan:   Informed Consent: I have reviewed the patients History and Physical, chart, labs and discussed the procedure including the risks, benefits and alternatives for the proposed anesthesia with the patient or authorized representative who has indicated his/her understanding and acceptance.     Dental Advisory Given  Plan Discussed with: Anesthesiologist, CRNA and Surgeon  Anesthesia Plan Comments:         Anesthesia Quick Evaluation  "

## 2024-11-27 NOTE — Op Note (Addendum)
 [facility] Patient Name: Michelle Simpson Procedure Date: 11/27/2024 10:47 AM MRN: [id number] Account #: [id number] Date of Birth: 1971-07-03 Admit Type: Outpatient Age: 54 Room: [facility] OR ROOM 01 Gender: Female Note Status: Finalized Instrument Name: Colonoscope [id number] Procedure:             Colonoscopy Indications:           Screening for colorectal malignant neoplasm Providers:             Clotilda Schaffer, MD Referring MD:          Jolynn Spencer (Referring MD) Medicines:             Propofol  per Anesthesia Complications:         No immediate complications. Procedure:             Pre-Anesthesia Assessment:                        - Prior to the procedure, a History and Physical was                         performed, and patient medications and allergies were                         reviewed. The patient's tolerance of previous                         anesthesia was also reviewed. The risks and benefits                         of the procedure and the sedation options and risks                         were discussed with the patient. All questions were                         answered, and informed consent was obtained. Prior                         Anticoagulants: The patient has taken no anticoagulant                         or antiplatelet agents. ASA Grade Assessment: II - A                         patient with mild systemic disease. After reviewing                         the risks and benefits, the patient was deemed in                         satisfactory condition to undergo the procedure.                        After obtaining informed consent, the colonoscope was                         passed under direct vision. Throughout the procedure,  the patient's blood pressure, pulse, and oxygen                         saturations were monitored continuously. The                         Colonoscope was introduced through the  anus and                         advanced to the 10 cm into the ileum. The colonoscopy                         was performed without difficulty. The patient                         tolerated the procedure well. The quality of the bowel                         preparation was good. The terminal ileum, ileocecal                         valve, appendiceal orifice, and rectum were                         photographed. Findings:      External and internal hemorrhoids were found. The hemorrhoids were Grade       I (internal hemorrhoids that do not prolapse).      The terminal ileum appeared normal. Impression:            - External and internal hemorrhoids.                        - The examined portion of the ileum was normal.                        - No specimens collected. Recommendation:        - Patient has a contact number available for                         emergencies. The signs and symptoms of potential                         delayed complications were discussed with the patient.                         Return to normal activities tomorrow. Written                         discharge instructions were provided to the patient.                        - High fiber diet.                        - Continue present medications.                        - Repeat colonoscopy in 10 days for screening purposes.                        -  The findings and recommendations were discussed with                         the designated responsible adult. Procedure Code(s):     --- Professional ---                        H9878, Colorectal cancer screening; colonoscopy on                         individual not meeting criteria for high risk Diagnosis Code(s):     --- Professional ---                        Z12.11, Encounter for screening for malignant neoplasm                         of colon CPT copyright 2022 American Medical Association. All rights reserved. The codes documented in this report are  preliminary and upon coder review may  be revised to meet current compliance requirements. Clotilda Schaffer, MD 11/27/2024 11:20:55 AM Number of Addenda: 0 Note Initiated On: 11/27/2024 10:47 AM Scope Withdrawal Time: 0 hours 6 minutes 47 seconds  Total Procedure Duration: 0 hours 9 minutes 1 second  Estimated Blood Loss:  Estimated blood loss: none.      [facility]

## 2024-11-29 ENCOUNTER — Encounter: Payer: Self-pay | Admitting: Sleep Medicine

## 2024-12-01 ENCOUNTER — Encounter: Payer: Self-pay | Admitting: Physician Assistant

## 2024-12-06 ENCOUNTER — Inpatient Hospital Stay: Attending: Obstetrics and Gynecology

## 2024-12-06 ENCOUNTER — Encounter: Payer: Self-pay | Admitting: Obstetrics and Gynecology

## 2024-12-06 ENCOUNTER — Inpatient Hospital Stay: Admitting: Obstetrics and Gynecology

## 2024-12-06 VITALS — BP 150/87 | HR 70 | Temp 97.3°F | Resp 18 | Wt 216.5 lb

## 2024-12-06 DIAGNOSIS — R970 Elevated carcinoembryonic antigen [CEA]: Secondary | ICD-10-CM | POA: Diagnosis not present

## 2024-12-06 DIAGNOSIS — N83201 Unspecified ovarian cyst, right side: Secondary | ICD-10-CM | POA: Diagnosis not present

## 2024-12-06 NOTE — Progress Notes (Signed)
 Gynecologic Oncology Consult Visit   Referring Provider: Dr JONETTA Dinsmore  Chief Concern: ovarian cyst, vaginal bleeding  Subjective:  Michelle Simpson is a 54 yo [id number] with a hx of obesity, endometriosis s/p hysterectomy, polycythemia vera, pernicious anemia, arthritis on chronic opioids, HTN, HLD, tobacco use, and vitamin D  deficiency referred for right ovarian cyst.   No vaginal spotting anymore. Not sexually active. She was on [facility] and lost some weight, but insurance coverage problematic so stopped in October.    MRI 07/25/24 Urinary Tract:  No abnormality visualized. Bowel:  Unremarkable visualized pelvic bowel loops. Vascular/Lymphatic: No pathologically enlarged lymph nodes. No significant vascular abnormality seen. Reproductive: Hysterectomy. Normal postmenopausal appearance of the right ovary (series 3, image 17). No visible left ovary, presumed surgically absent. No mass, cyst, or other abnormality. Specifically, no evidence of pelvic endometriosis. Other:  None. Musculoskeletal: No suspicious bone lesions identified.   IMPRESSION: 1. Normal postmenopausal appearance of the right ovary. No visible left ovary, presumed surgically absent. No mass, cyst, or other abnormality. Specifically, no evidence of pelvic endometriosis. 2. Hysterectomy.  Gyn history Seen in the ED on 06/04/24 for postmenopausal bleeding. Exam without blood in the vagina. Hb 16.1 and WBC count 12.6. CMP wnl. UA moderate Hb but otherwise neg. Ucx 1000 colonies GBS. Lipase neg. GC/CT neg. Wet prep neg. PAP negative   Patient reports a couple of weeks ago she starting having pelvic cramping and then went to the bathroom and saw bright red blood in her underwear and realized it was from the vagina. Continued for hours. No bleeding after that but continued to have cramping (felt like menstrual cramps). Then one week later had some spotting for two days- last three days ago. Denies hematuria, dysuria, blood in  stool. Reports BMs every two days.   Pelvic ultrasound- 07/04/24 Endovaginal Imaging ================  Indication: Endovaginal imaging was necessary to evaluate the ovaries.    Uterus ======  Not examined Surgically Absent  Right Ovary =========  Visualized, Enlarged. Outline: smooth. Size 33 mm x 21 mm x 28 mm Cyst(s)    Complex unilocular with cystic and solid components; acoustic shadows not present; no Color Doppler . Size 23.0 mm x 19.0 mm x 18 mm.        Mean 20.0 mm. Vol 4.119 cm  Left Ovary ========  Not visualized, left oophorectomy noted No cysts identified  Cul de Sac =========  Normal  Bladder ======  Visualized  Impression =========    The uterus is surgically absent. Vaginal cuff appears grossly normal.  Right ovary contains a complex unilocular cyst with solid and cystic components measuring approximately 23mm; patient experienced pain/discomfort in that area. Left ovary removed. No free fluid seen  Tumor markers 8/25: CA19-9 elevated to 56 CA-125 10.7, HE4 54.1, CEA 2.0 Inhibin A 1.1 and inhibin B <7, FSH 81.5 ROMA value low risk for postmenopausal  PAP and HPV negative  Denies any history of STIs.  Not sexually active  Abdominal surgeries: chole, dx lap excision endometriosis, TLH BS 2005 for endometriosis, laparoscopic trachelectomy and LO 2011 with Dr Herb. Had cuff abscess post op. No hormonal therapy after that.  GYN procedures: D&C  Reports has quit smoking a few times in the past but in general goes back due to stress and the social aspect.     Problem List: Patient Active Problem List   Diagnosis Date Noted   Uncontrolled hypertension 11/26/2024   Encounter for screening colonoscopy 11/26/2024   Right ovarian cyst 07/04/2024  S/P hysterectomy 07/04/2024   Polycythemia vera (HCC) 07/04/2024   Mild intermittent asthma without complication 11/18/2023   Polycythemia 11/18/2023   Tobacco abuse counseling 11/18/2023   Obesity (BMI 30-39.9)  01/21/2023   Snoring 04/15/2021   Smoker 04/15/2021   History of fibromyalgia 03/05/2021   B12 deficiency 03/05/2021   Vitamin D  insufficiency 03/05/2021   Fatigue 03/05/2021   Muscle spasm of back 02/11/2021   PTSD (post-traumatic stress disorder) 02/03/2021   Bereavement 02/03/2021   Spondylosis of lumbar region without myelopathy or radiculopathy 07/15/2016   Irritable bowel syndrome 04/29/2016   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   Chronic pelvic pain in female 03/20/2016   Allergic rhinitis 03/20/2016   Hyperlipidemia 03/20/2016   Osteoarthritis of hip 03/20/2016   Heterozygous factor V Leiden mutation 03/20/2016   History of deep venous thrombosis 03/20/2016   Primary osteoarthritis of both knees 10/23/2014   DDD (degenerative disc disease), lumbar 10/04/2014   Chondromalacia of knee, 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   Neuritis or radiculitis due to rupture of lumbar intervertebral disc 09/12/2014   Lumbar radiculitis 09/12/2014   Edema of lower extremity 09/05/2014   Bilateral leg edema 09/05/2014   Hip impingement syndrome 06/19/2014   Endometriosis 05/06/2010   Primary hypertension 03/11/2009    Past Medical History: Past Medical History:  Diagnosis Date   B12 deficiency    Degenerative disc disease, lumbar    Depression    DJD (degenerative joint disease)    Eczema    Edema    Endometriosis    Family history of adverse reaction to anesthesia    mother has PONV   GERD (gastroesophageal reflux disease)    High mean corpuscular hemoglobin concentration (MCHC)    History of DVT (deep vein thrombosis)    History of kidney stones    Hypertension    IBS (irritable bowel syndrome)    Neuromuscular disorder (HCC)    neuropathy with DDD   Osteoarthritis of right hip    Panic attack    Pernicious anemia    PONV (postoperative nausea and vomiting)     Past Surgical History: Past Surgical History:  Procedure Laterality Date   ABDOMINAL HYSTERECTOMY     ANKLE SURGERY Right     ANKLE SURGERY Right    plate placed   Bilateral knee RFA nerve ablation @ [facility]     CHOLECYSTECTOMY     COLONOSCOPY N/A 11/27/2024   Procedure: COLONOSCOPY;  Surgeon: Melany Clotilda HERO, MD;  Location: [facility];  Service: Endoscopy;  Laterality: N/A;   KNEE SURGERY Left 2012   LACRIMAL DUCT RECONSTRUCTION     LAPAROSCOPY     miscarriage D&C  1995   TYMPANOSTOMY TUBE PLACEMENT  1977     Family History: Family History  Problem Relation Age of Onset   Hypertension Father    Hypertension Mother    Deep vein thrombosis Mother    Narcolepsy Mother    Anxiety disorder Mother    Hypertension Maternal Grandmother    Hypertension Paternal Grandmother    Hypertension Brother    Heart attack Brother    Liver disease Brother     Social History: Social History   Socioeconomic History   Marital status: Divorced    Spouse name: Not on file   Number of children: 1   Years of education: Not on file   Highest education level: Bachelor's degree (e.g., BA, AB, BS)  Occupational History   Not on file  Tobacco Use   Smoking status: Every Day  Current packs/day: 1.00    Average packs/day: 1 pack/day for 15.1 years (15.1 ttl pk-yrs)    Types: Cigarettes    Start date: 11/16/2009   Smokeless tobacco: Never   Tobacco comments:    1 ppd - reported 04/29/2021  Vaping Use   Vaping status: Former   Start date: 11/17/2015   Quit date: 01/07/2020   Substances: Nicotine, Flavoring  Substance and Sexual Activity   Alcohol use: Yes    Comment: occaisionally   Drug use: No   Sexual activity: Never    Birth control/protection: None  Other Topics Concern   Not on file  Social History Narrative   Not on file   Social Drivers of Health   Tobacco Use: High Risk (12/06/2024)   Patient History    Smoking Tobacco Use: Every Day    Smokeless Tobacco Use: Never    Passive Exposure: Not on file  Financial Resource Strain: Low Risk (11/24/2024)   Overall Financial Resource Strain  (CARDIA)    Difficulty of Paying Living Expenses: Not very hard  Food Insecurity: No Food Insecurity (11/24/2024)   Epic    Worried About Programme Researcher, Broadcasting/film/video in the Last Year: Never true    Ran Out of Food in the Last Year: Never true  Transportation Needs: No Transportation Needs (11/24/2024)   Epic    Lack of Transportation (Medical): No    Lack of Transportation (Non-Medical): No  Physical Activity: Inactive (05/17/2024)   Exercise Vital Sign    Days of Exercise per Week: 0 days    Minutes of Exercise per Session: Not on file  Stress: No Stress Concern Present (11/24/2024)   [employer] of Occupational Health - Occupational Stress Questionnaire    Feeling of Stress: Not at all  Social Connections: Moderately Integrated (11/24/2024)   Social Connection and Isolation Panel    Frequency of Communication with Friends and Family: Three times a week    Frequency of Social Gatherings with Friends and Family: Twice a week    Attends Religious Services: 1 to 4 times per year    Active Member of [employer] or Organizations: Yes    Attends Banker Meetings: More than 4 times per year    Marital Status: Widowed  Intimate Partner Violence: Not At Risk (07/19/2024)   Epic    Fear of Current or Ex-Partner: No    Emotionally Abused: No    Physically Abused: No    Sexually Abused: No  Depression (PHQ2-9): Low Risk (08/30/2024)   Depression (PHQ2-9)    PHQ-2 Score: 0  Alcohol Screen: Low Risk (11/24/2024)   Alcohol Screen    Last Alcohol Screening Score (AUDIT): 1  Housing: Low Risk (11/24/2024)   Epic    Unable to Pay for Housing in the Last Year: No    Number of Times Moved in the Last Year: 0    Homeless in the Last Year: No  Utilities: Not At Risk (07/19/2024)   Epic    Threatened with loss of utilities: No  Health Literacy: Not on file    Allergies: Allergies  Allergen Reactions   Atenolol Hives    Urticaria.   Gabapentin Swelling   Nortriptyline Swelling   Amlodipine  Swelling   Ibuprofen Other (See Comments)    With prolonged use swelling, HTN and congestion.   Latex Hives    Urticaria.   Meloxicam     Other reaction(s): Other (See Comments) Upset stomach.   Metoprolol Hives   Nsaids  Swelling   Paxil Cr [Paroxetine Hcl Er] Other (See Comments)    hallucination    Current Medications: Current Outpatient Medications  Medication Sig Dispense Refill   albuterol  (VENTOLIN  HFA) 108 (90 Base) MCG/ACT inhaler Inhale 2 puffs into the lungs every 6 (six) hours as needed for wheezing or shortness of breath. 18 g 2   atorvastatin  (LIPITOR) 10 MG tablet Take 1 tablet (10 mg total) by mouth daily. 90 tablet 3   famotidine (PEPCID) 40 MG tablet Take 40 mg by mouth daily. Take one tablet by mouth in the evening     hydrALAZINE  (APRESOLINE ) 25 MG tablet Take 1 tablet (25 mg total) by mouth 3 (three) times daily. 270 tablet 1   hydrochlorothiazide  (HYDRODIURIL ) 25 MG tablet Take 1 tablet (25 mg total) by mouth daily. 90 tablet 1   losartan  (COZAAR ) 50 MG tablet TAKE 1 TABLET BY MOUTH TWICE DAILY 180 tablet 1   methocarbamol  (ROBAXIN ) 500 MG tablet Take 1 tablet (500 mg total) by mouth 4 (four) times daily. 40 tablet 2   naloxone (NARCAN) nasal spray 4 mg/0.1 mL      oxyCODONE -acetaminophen  (PERCOCET/ROXICET) 5-325 MG tablet Take 1 tablet by mouth every 4 (four) hours as needed. Take one tablet 6x daily     Vitamin D , Ergocalciferol , (DRISDOL) 1.25 MG (50000 UNIT) CAPS capsule Take 50,000 Units by mouth once a week.     No current facility-administered medications for this visit.    Review of Systems General: negative for, fevers, chills, fatigue, changes in sleep, changes in weight or appetite Skin: negative for changes in color, texture, moles or lesions Eyes: negative for, changes in vision, pain, diplopia HEENT: negative for, change in hearing, pain, discharge, tinnitus, vertigo, voice changes, sore throat, neck masses Pulmonary: negative for, dyspnea,  orthopnea, productive cough Cardiac: negative for, palpitations, syncope, pain, discomfort, pressure Gastrointestinal: negative for, dysphagia, nausea, vomiting, jaundice, pain, constipation, diarrhea, hematemesis, hematochezia Genitourinary/Sexual: negative for, dysuria, discharge, hesitancy, nocturia, retention, stones, infections, STD's, incontinence Musculoskeletal: negative for, pain, stiffness, swelling, range of motion limitation Hematology: negative for, easy bruising, bleeding Neurologic/Psych: negative for, headaches, seizures, paralysis, weakness, tremor, change in gait, change in sensation, mood swings, depression, anxiety, change in memory  Objective:  Physical Examination:  BP (!) 150/87   Pulse 70   Temp (!) 97.3 F (36.3 C)   Resp 18   Wt 216 lb 8 oz (98.2 kg)   SpO2 97%   BMI 33.90 kg/m    ECOG Performance Status: 1 - Symptomatic but completely ambulatory  General appearance: alert, cooperative, and appears stated age HEENT:neck supple with midline trachea and thyroid without masses Lymph node survey: non-palpable, axillary, inguinal, supraclavicular Cardiovascular: regular rate and rhythm, no murmurs or gallops Respiratory: normal air entry, lungs clear to auscultation and no rales, rhonchi or wheezing Abdomen: no hernias and well healed incision Back: inspection of back is normal Extremities: extremities normal, atraumatic, no cyanosis or edema Skin exam - normal coloration and turgor, no rashes, no suspicious skin lesions noted. Neurological exam reveals alert, oriented, normal speech, no focal findings or movement disorder noted.  Pelvic: exam chaperoned by nurse, exam from last visit EGBUS within normal limits;   Vulva: normal appearing vulva with no masses, tenderness or lesions;  Vagina: normal vagina; Adnexa: normal adnexa in size, nontender and no masses;  Uterus: absent;  Cervix: absent;  Rectal: not indicated     Assessment:  Michelle Simpson is a 54  y.o. P47 female with episodes of  vaginal bleeding 7/25.  No lesion seen and PAP normal. She had an US  and was found to have a 2.3 cm complex right ovarian cystic lesion with solid areas, complex, but no color flow. History of dx lap excision endometriosis, TLH BS 2005 for endometriosis, laparoscopic trachelectomy and left oophorectomy 2011 with Dr. Herb for endometriosis. Had cuff abscess post op. No hormonal therapy after that.   MRI exam 9/25 normal.  No evidence of ovarian cyst or other pathology.   No further vaginal bleeding. Normal exam today.   Tumor markers 8/25: CA19-9 elevated to 56 Normal markers: CA-125 10.7, HE4 54.1, CEA 2.0 Inhibin A 1.1 and inhibin B <7,  ROMA value low risk for postmenopausal  FSH 81.5 PAP/HPV normal  Medical co-morbidities complicating care: Michelle Simpson  Plan:   Problem List Items Addressed This Visit       Endocrine   Right ovarian cyst - Primary    CA125 today pending.  CA19-9 slightly elevated.  Discussed with Dr Jacobo.  Could be due to IBD, but she has not received indicated colon cancer screening or lung cancer CT screening.  Will arrange for these and see her back in 3 months with CA125 and CA 19-9.  If CA125 today normal she will follow up with Dr D Schermerhorn in 6 months.   We can see her back should the need arise.    The patient's diagnosis, an outline of the further diagnostic and laboratory studies which will be required, the recommendation, and alternatives were discussed.  All questions were answered to the patient's satisfaction.  Prentice Agent, MD  CC:  Dineen Channel, PA-C [address] [place],  [place] [address] [phone]

## 2024-12-07 LAB — CA 125: Cancer Antigen (CA) 125: 9.4 U/mL (ref 0.0–38.1)
